# Patient Record
Sex: Female | Born: 1955 | Race: White | Hispanic: No | Marital: Married | Smoking: Never smoker
Health system: Southern US, Community
[De-identification: ages and names within clinical notes are randomized; demographics above are authoritative.]

## PROBLEM LIST (undated history)

## (undated) DIAGNOSIS — T7840XA Allergy, unspecified, initial encounter: Secondary | ICD-10-CM

## (undated) DIAGNOSIS — B0059 Other herpesviral disease of eye: Secondary | ICD-10-CM

## (undated) DIAGNOSIS — M810 Age-related osteoporosis without current pathological fracture: Secondary | ICD-10-CM

## (undated) DIAGNOSIS — F419 Anxiety disorder, unspecified: Secondary | ICD-10-CM

## (undated) DIAGNOSIS — R519 Headache, unspecified: Secondary | ICD-10-CM

## (undated) DIAGNOSIS — C50919 Malignant neoplasm of unspecified site of unspecified female breast: Secondary | ICD-10-CM

## (undated) DIAGNOSIS — R51 Headache: Secondary | ICD-10-CM

## (undated) DIAGNOSIS — Z923 Personal history of irradiation: Secondary | ICD-10-CM

## (undated) DIAGNOSIS — J45909 Unspecified asthma, uncomplicated: Secondary | ICD-10-CM

## (undated) DIAGNOSIS — Z79890 Hormone replacement therapy (postmenopausal): Secondary | ICD-10-CM

## (undated) HISTORY — DX: Anxiety disorder, unspecified: F41.9

## (undated) HISTORY — DX: Age-related osteoporosis without current pathological fracture: M81.0

## (undated) HISTORY — DX: Headache: R51

## (undated) HISTORY — DX: Allergy, unspecified, initial encounter: T78.40XA

## (undated) HISTORY — DX: Hormone replacement therapy: Z79.890

## (undated) HISTORY — PX: BREAST SURGERY: SHX581

## (undated) HISTORY — DX: Malignant neoplasm of unspecified site of unspecified female breast: C50.919

## (undated) HISTORY — PX: WISDOM TOOTH EXTRACTION: SHX21

## (undated) HISTORY — DX: Headache, unspecified: R51.9

---
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
## 2009-06-15 LAB — HM DEXA SCAN

## 2010-03-03 ENCOUNTER — Encounter: Payer: Self-pay | Admitting: Family Medicine

## 2014-06-05 LAB — HM COLONOSCOPY

## 2015-06-06 LAB — HM MAMMOGRAPHY

## 2015-06-06 LAB — HM PAP SMEAR

## 2016-04-06 LAB — HEPATIC FUNCTION PANEL
ALT: 38 — AB (ref 7–35)
AST: 33 (ref 13–35)

## 2016-04-06 LAB — LIPID PANEL
Cholesterol: 193 (ref 0–200)
LDL Cholesterol: 143
Triglycerides: 83 (ref 40–160)

## 2016-04-06 LAB — BASIC METABOLIC PANEL
BUN: 19 (ref 4–21)
Potassium: 4.2 (ref 3.4–5.3)
Sodium: 139 (ref 137–147)

## 2016-04-06 LAB — CBC AND DIFFERENTIAL
HCT: 38 (ref 36–46)
Hemoglobin: 12.7 (ref 12.0–16.0)
Platelets: 258 (ref 150–399)
WBC: 4.4

## 2016-04-06 LAB — VITAMIN D 25 HYDROXY (VIT D DEFICIENCY, FRACTURES): Vit D, 25-Hydroxy: 46

## 2016-04-06 LAB — TSH: TSH: 1.98 (ref <=5.90)

## 2016-05-22 ENCOUNTER — Encounter: Payer: Self-pay | Admitting: Family Medicine

## 2016-05-22 LAB — BASIC METABOLIC PANEL: Glucose: 91

## 2016-05-22 LAB — HM HEPATITIS C SCREENING LAB: HM Hepatitis Screen: NEGATIVE

## 2018-02-25 DIAGNOSIS — Z23 Encounter for immunization: Secondary | ICD-10-CM | POA: Diagnosis not present

## 2018-03-14 ENCOUNTER — Encounter: Payer: Self-pay | Admitting: Family Medicine

## 2018-03-14 ENCOUNTER — Encounter: Payer: Self-pay | Admitting: Emergency Medicine

## 2018-03-14 ENCOUNTER — Ambulatory Visit (INDEPENDENT_AMBULATORY_CARE_PROVIDER_SITE_OTHER): Payer: BLUE CROSS/BLUE SHIELD | Admitting: Family Medicine

## 2018-03-14 VITALS — BP 110/62 | HR 71 | Temp 98.0°F | Ht 64.25 in | Wt 133.8 lb

## 2018-03-14 DIAGNOSIS — Z23 Encounter for immunization: Secondary | ICD-10-CM | POA: Diagnosis not present

## 2018-03-14 DIAGNOSIS — M25532 Pain in left wrist: Secondary | ICD-10-CM | POA: Diagnosis not present

## 2018-03-14 DIAGNOSIS — Z79890 Hormone replacement therapy (postmenopausal): Secondary | ICD-10-CM | POA: Insufficient documentation

## 2018-03-14 DIAGNOSIS — Z780 Asymptomatic menopausal state: Secondary | ICD-10-CM

## 2018-03-14 DIAGNOSIS — B351 Tinea unguium: Secondary | ICD-10-CM

## 2018-03-14 DIAGNOSIS — M8580 Other specified disorders of bone density and structure, unspecified site: Secondary | ICD-10-CM

## 2018-03-14 DIAGNOSIS — F4322 Adjustment disorder with anxiety: Secondary | ICD-10-CM | POA: Insufficient documentation

## 2018-03-14 DIAGNOSIS — Z8262 Family history of osteoporosis: Secondary | ICD-10-CM | POA: Insufficient documentation

## 2018-03-14 DIAGNOSIS — M810 Age-related osteoporosis without current pathological fracture: Secondary | ICD-10-CM | POA: Insufficient documentation

## 2018-03-14 HISTORY — DX: Hormone replacement therapy: Z79.890

## 2018-03-14 HISTORY — DX: Asymptomatic menopausal state: Z78.0

## 2018-03-14 HISTORY — DX: Other specified disorders of bone density and structure, unspecified site: M85.80

## 2018-03-14 MED ORDER — SERTRALINE HCL 100 MG PO TABS: 100.0000 mg | ORAL_TABLET | Freq: Every day | ORAL | 3 refills | Status: DC

## 2018-03-14 NOTE — Patient Instructions (Addendum)
Please return in 6-8 weeks for your annual complete physical; please come fasting and to recheck mood.  Please go to our [facility] office to get your xrays done. You can walk in M-F between 8am and 5pm. Tell them you are there for xrays ordered by me. They will send me the results, then I will let you know the results with instructions.   Address: [place], [place], [place]  (office sits at [address] at [address] intersection; from here, turn left onto [place] [address]), take to [address], turn right and go for a mile or so, office will be on left across form [employer] )   It was a pleasure meeting you today! Thank you for choosing [place] to meet your healthcare needs! I truly look forward to working with you. If you have any questions or concerns, please send me a message via Mychart or call the office at [phone].   Fungal Nail Infection Fungal nail infection is a common fungal infection of the toenails or fingernails. This condition affects toenails more often than fingernails. More than one nail may be infected. The condition can be passed from person to person (is contagious). What are the causes? This condition is caused by a fungus. Several types of funguses can cause the infection. These funguses are common in moist and warm areas. If your hands or feet come into contact with the fungus, it may get into a crack in your fingernail or toenail and cause the infection. What increases the risk? The following factors may make you more likely to develop this condition:  Being female.  Having diabetes.  Being of older age.  Living with someone who has the fungus.  Walking barefoot in areas where the fungus thrives, such as showers or locker rooms.  Having poor circulation.  Wearing shoes and socks that cause your feet to sweat.  Having athlete's foot.  Having a nail injury or history of a recent nail  surgery.  Having psoriasis.  Having a weak body defense system (immune system).  What are the signs or symptoms? Symptoms of this condition include:  A pale spot on the nail.  Thickening of the nail.  A nail that becomes yellow or brown.  A brittle or ragged nail edge.  A crumbling nail.  A nail that has lifted away from the nail bed.  How is this diagnosed? This condition is diagnosed with a physical exam. Your health care provider may take a scraping or clipping from your nail to test for the fungus. How is this treated? Mild infections do not need treatment. If you have significant nail changes, treatment may include:  Oral antifungal medicines. You may need to take the medicine for several weeks or several months, and you may not see the results for a long time. These medicines can cause side effects. Ask your health care provider what problems to watch for.  Antifungal nail polish and nail cream. These may be used along with oral antifungal medicines.  Laser treatment of the nail.  Surgery to remove the nail. This may be needed for the most severe infections.  Treatment takes a long time, and the infection may come back. Follow these instructions at home: Medicines  Take or apply over-the-counter and prescription medicines only as told by your health care provider.  Ask your health care provider about using over-the-counter mentholated ointment on your nails. Lifestyle   Do not share personal  items, such as towels or nail clippers.  Trim your nails often.  Wash and dry your hands and feet every day.  Wear absorbent socks, and change your socks frequently.  Wear shoes that allow air to circulate, such as sandals or canvas tennis shoes. Throw out old shoes.  Wear rubber gloves if you are working with your hands in wet areas.  Do not walk barefoot in shower rooms or locker rooms.  Do not use a nail salon that does not use clean instruments.  Do not use  artificial nails. General instructions  Keep all follow-up visits as told by your health care provider. This is important.  Use antifungal foot powder on your feet and in your shoes. Contact a health care provider if: Your infection is not getting better or it is getting worse after several months. This information is not intended to replace advice given to you by your health care provider. Make sure you discuss any questions you have with your health care provider. Document Released: 05/19/2000 Document Revised: 10/28/2015 Document Reviewed: 11/23/2014 Elsevier Interactive Patient Education  2018 [employer].

## 2018-03-14 NOTE — Progress Notes (Signed)
Subjective  CC:  Chief Complaint  Patient presents with  . Establish Care    sees GYN doctor, request TDAP today   . Wrist Pain    fell last year and still has pains in Left Wrist   . Nail Problem    she states that she has a fungus Left Great Toe    HPI: Katrina Williamson is a 62 y.o. female who presents to [place] [facility] today to establish care with me as a new patient.  Moved here from May in [place]. Works for [employer], now [employer]. Forensic psychologist. Stressful job. Recently divorced after 32 year marriage. Now single with 2 cats.  She is coping very well.  She is happy about living in [place] and looks forward to this new adventure. She has the following concerns or needs:  She admits she is feeling slightly down, appropriately so.  But oversleeping.  Is on sertraline and that has worked well for her.  Uses rare Xanax for panic-like symptoms.  PHQ 9 score is 10 today.  Would be open to adjusting medication to help her symptoms while she transitions.  Left wrist pain: She fell and outstretched arm in January of this year due to slipping on the ice.  She said she had mild pain without swelling or bruising.  It was not very limiting at all.  Never sought medical care.  However still has pain with pressing on left wrist especially while doing yoga or significant work with the left hand.  No weakness in the hand.  No paresthesias.  Does not require pain medicine.  Think she has a nail fungus left great toe.  Started after getting her nails done.  No pain, no redness.  Past medical history significant for mildly early menopause at age 9, since on HRT.  Does well.  Has strong family history of osteoporosis.  By chart review of old records she does carry the diagnosis of osteopenia with lowest T score of -2.3.  She does take calcium and vitamin D.  May be due for bone density.  Health maintenance: Up-to-date on colorectal cancer screening.  Pap smear  due next year.  Assessment  1. Adjustment disorder with anxiety   2. Postmenopausal HRT (hormone replacement therapy)   3. Family history of osteoporosis   4. Left wrist pain   5. Onychomycosis   6. Osteopenia after menopause      Plan   Discussed adjustment disorder with history of anxiety: Increase sertraline to 100 mg daily titrate up slowly over the next to 3 weeks.  Recheck 4 to 6 weeks.  Onychomycosis: Monitor for now.  Defers topicals.  Risk outweigh benefits for oral antifungals.  Osteopenia and osteoporosis in the family: Recommend weightbearing exercise, continue vitamin D and calcium and recheck bone density when due.  Left wrist pain: Damaged the triangular fibrocartilage mechanism: Check x-ray to look for arthritis.  If persists, consider hand surgery referral.  Patient agrees.  Continue HRT for now.  Over the next 2 to 3 years will consider weaning.  Discussed elevating cardiovascular risk as patient ages.  Follow up:  Return in about 8 weeks (around 05/09/2018) for complete physical, mood follow up. Orders Placed This Encounter  Procedures  . HM MAMMOGRAPHY  . DG Wrist Complete Left  . HM PAP SMEAR  . HM COLONOSCOPY   Meds ordered this encounter  Medications  . sertraline (ZOLOFT) 100 MG tablet    Sig: Take 1  tablet (100 mg total) by mouth daily.    Dispense:  90 tablet    Refill:  3     No flowsheet data found.  We updated and reviewed the patient's past history in detail and it is documented below.  Patient Active Problem List   Diagnosis Date Noted  . Postmenopausal HRT (hormone replacement therapy) 03/14/2018    Menopause at age 74;    . Family history of osteoporosis 03/14/2018    Mom with severe osteoporosis   . Adjustment disorder with anxiety 03/14/2018  . Osteopenia after menopause 03/14/2018    2011 DEXA, T equals -2.3.  See old records    Health Maintenance  Topic Date Due  . DEXA SCAN  11/20/1955  . TETANUS/TDAP  09/19/1974  .  MAMMOGRAM  06/05/2016  . INFLUENZA VACCINE  01/03/2018  . PAP SMEAR  06/05/2018  . COLONOSCOPY  06/06/2019  . Hepatitis C Screening  Completed  . HIV Screening  Completed    There is no immunization history on file for this patient. Current Meds  Medication Sig  . ALPRAZolam (XANAX) 0.25 MG tablet Take 0.25 mg by mouth at bedtime as needed for anxiety.  . calcium-vitamin D (OSCAL WITH D) 250-125 MG-UNIT tablet Take 1 tablet by mouth daily.  Marland Kitchen estradiol (VIVELLE-DOT) 0.05 MG/24HR patch   . progesterone (PROMETRIUM) 100 MG capsule   . sertraline (ZOLOFT) 100 MG tablet Take 1 tablet (100 mg total) by mouth daily.  . [DISCONTINUED] sertraline (ZOLOFT) 50 MG tablet     Allergies: Patient has no allergies on file. Past Medical History Patient  has a past medical history of Allergies, Frequent headaches, Osteopenia after menopause (03/14/2018), and Postmenopausal HRT (hormone replacement therapy) (03/14/2018). Past Surgical History Patient  has no past surgical history on file. Family History: Patient family history is not on file. Social History:  Patient  reports that she has never smoked. She has never used smokeless tobacco. She reports that she does not drink alcohol or use drugs.  Review of Systems: Constitutional: negative for fever or malaise Ophthalmic: negative for photophobia, double vision or loss of vision Cardiovascular: negative for chest pain, dyspnea on exertion, or new LE swelling Respiratory: negative for SOB or persistent cough Gastrointestinal: negative for abdominal pain, change in bowel habits or melena Genitourinary: negative for dysuria or gross hematuria Musculoskeletal: negative for new gait disturbance or muscular weakness Integumentary: negative for new or persistent rashes Neurological: negative for TIA or stroke symptoms Psychiatric: negative for SI or delusions Allergic/Immunologic: negative for hives  Patient Care Team    Relationship Specialty  Notifications Start End  Leamon Arnt, MD PCP - General Family Medicine  03/14/18     Objective  Vitals: BP 110/62   Pulse 71   Temp 98 F (36.7 C)   Ht 5' 4.25" (1.632 m)   Wt 133 lb 12.8 oz (60.7 kg)   SpO2 98%   BMI 22.79 kg/m  General:  Well developed, well nourished, no acute distress  Psych:  Alert and oriented,normal mood and affect HEENT:  Normocephalic, atraumatic, non-icteric sclera, PERRL, oropharynx is without mass or exudate, supple neck  MSK: no deformities, contusions. Joints are without erythema or swelling Left wrist: Prominent styloid process laterally nontender, no swelling warmth or redness.  Full range of motion pain with forced dorsiflexion.  Normal grip. Skin:  Warm, no rashes or suspicious lesions noted, left great toenail with medial edge and discoloration.  No surrounding redness.  No ingrown toenail. Neurologic:  Mental status is normal. Gross motor and sensory exams are normal. Normal gait   Commons side effects, risks, benefits, and alternatives for medications and treatment plan prescribed today were discussed, and the patient expressed understanding of the given instructions. Patient is instructed to call or message via MyChart if he/she has any questions or concerns regarding our treatment plan. No barriers to understanding were identified. We discussed Red Flag symptoms and signs in detail. Patient expressed understanding regarding what to do in case of urgent or emergency type symptoms.   Medication list was reconciled, printed and provided to the patient in AVS. Patient instructions and summary information was reviewed with the patient as documented in the AVS. This note was prepared with assistance of Dragon voice recognition software. Occasional wrong-word or sound-a-like substitutions may have occurred due to the inherent limitations of voice recognition software

## 2018-03-15 ENCOUNTER — Encounter: Payer: Self-pay | Admitting: Emergency Medicine

## 2018-03-19 DIAGNOSIS — L820 Inflamed seborrheic keratosis: Secondary | ICD-10-CM | POA: Diagnosis not present

## 2018-03-19 DIAGNOSIS — Z1283 Encounter for screening for malignant neoplasm of skin: Secondary | ICD-10-CM | POA: Diagnosis not present

## 2018-03-19 DIAGNOSIS — D485 Neoplasm of uncertain behavior of skin: Secondary | ICD-10-CM | POA: Diagnosis not present

## 2018-03-19 DIAGNOSIS — L918 Other hypertrophic disorders of the skin: Secondary | ICD-10-CM | POA: Diagnosis not present

## 2018-04-03 NOTE — Progress Notes (Signed)
Please call patient: I have reviewed his/her bone density results. dexa scan shows stable osteopenia or low bone mass. No changes in therapy at this time: continue vit D and Ca if recommended and weight bearing exercise. We will recheck in 2 years.

## 2018-04-05 ENCOUNTER — Ambulatory Visit (INDEPENDENT_AMBULATORY_CARE_PROVIDER_SITE_OTHER): Payer: BLUE CROSS/BLUE SHIELD

## 2018-04-05 DIAGNOSIS — M25532 Pain in left wrist: Secondary | ICD-10-CM | POA: Diagnosis not present

## 2018-04-05 DIAGNOSIS — M19032 Primary osteoarthritis, left wrist: Secondary | ICD-10-CM | POA: Diagnosis not present

## 2018-04-08 NOTE — Progress Notes (Signed)
Please call patient: I have reviewed his/her lab results. Wrist xray shows mild arthritis. This could be contributing to pain. As we discussed, if worsens or wants more help, may refer to hand surgeon for evaluation.

## 2018-04-25 ENCOUNTER — Ambulatory Visit (INDEPENDENT_AMBULATORY_CARE_PROVIDER_SITE_OTHER): Payer: BLUE CROSS/BLUE SHIELD | Admitting: Family Medicine

## 2018-04-25 ENCOUNTER — Encounter: Payer: Self-pay | Admitting: Family Medicine

## 2018-04-25 ENCOUNTER — Other Ambulatory Visit: Payer: Self-pay

## 2018-04-25 VITALS — BP 118/70 | HR 69 | Temp 98.7°F | Resp 14 | Ht 64.0 in | Wt 132.0 lb

## 2018-04-25 DIAGNOSIS — M810 Age-related osteoporosis without current pathological fracture: Secondary | ICD-10-CM

## 2018-04-25 DIAGNOSIS — E2839 Other primary ovarian failure: Secondary | ICD-10-CM

## 2018-04-25 DIAGNOSIS — F4322 Adjustment disorder with anxiety: Secondary | ICD-10-CM

## 2018-04-25 DIAGNOSIS — Z1239 Encounter for other screening for malignant neoplasm of breast: Secondary | ICD-10-CM

## 2018-04-25 DIAGNOSIS — Z8262 Family history of osteoporosis: Secondary | ICD-10-CM

## 2018-04-25 DIAGNOSIS — Z79890 Hormone replacement therapy (postmenopausal): Secondary | ICD-10-CM

## 2018-04-25 DIAGNOSIS — Z780 Asymptomatic menopausal state: Secondary | ICD-10-CM

## 2018-04-25 DIAGNOSIS — M8580 Other specified disorders of bone density and structure, unspecified site: Secondary | ICD-10-CM

## 2018-04-25 DIAGNOSIS — Z0000 Encounter for general adult medical examination without abnormal findings: Secondary | ICD-10-CM | POA: Diagnosis not present

## 2018-04-25 LAB — COMPREHENSIVE METABOLIC PANEL
ALT: 17 U/L (ref 0–35)
AST: 20 U/L (ref 0–37)
Albumin: 4.6 g/dL (ref 3.5–5.2)
Alkaline Phosphatase: 81 U/L (ref 39–117)
BUN: 14 mg/dL (ref 6–23)
CO2: 29 mEq/L (ref 19–32)
Calcium: 9.7 mg/dL (ref 8.4–10.5)
Chloride: 102 mEq/L (ref 96–112)
Creatinine, Ser: 0.98 mg/dL (ref 0.40–1.20)
GFR: 61 mL/min (ref >60.00)
Glucose, Bld: 93 mg/dL (ref 70–99)
Potassium: 4.1 mEq/L (ref 3.5–5.1)
Sodium: 139 mEq/L (ref 135–145)
Total Bilirubin: 0.6 mg/dL (ref 0.2–1.2)
Total Protein: 7.3 g/dL (ref 6.0–8.3)

## 2018-04-25 LAB — CBC WITH DIFFERENTIAL/PLATELET
Basophils Absolute: 0 10*3/uL (ref 0.0–0.1)
Basophils Relative: 0.7 % (ref 0.0–3.0)
Eosinophils Absolute: 0.2 10*3/uL (ref 0.0–0.7)
Eosinophils Relative: 3.8 % (ref 0.0–5.0)
HCT: 43.5 % (ref 36.0–46.0)
Hemoglobin: 14.7 g/dL (ref 12.0–15.0)
Lymphocytes Relative: 25.2 % (ref 12.0–46.0)
Lymphs Abs: 1.4 10*3/uL (ref 0.7–4.0)
MCHC: 33.7 g/dL (ref 30.0–36.0)
MCV: 91.6 fl (ref 78.0–100.0)
Monocytes Absolute: 0.4 10*3/uL (ref 0.1–1.0)
Monocytes Relative: 7.5 % (ref 3.0–12.0)
Neutro Abs: 3.6 10*3/uL (ref 1.4–7.7)
Neutrophils Relative %: 62.8 % (ref 43.0–77.0)
Platelets: 250 10*3/uL (ref 150.0–400.0)
RBC: 4.75 Mil/uL (ref 3.87–5.11)
RDW: 12.9 % (ref 11.5–15.5)
WBC: 5.7 10*3/uL (ref 4.0–10.5)

## 2018-04-25 LAB — LIPID PANEL
Cholesterol: 224 mg/dL — ABNORMAL HIGH (ref 0–200)
HDL: 61.4 mg/dL (ref >39.00)
LDL Cholesterol: 144 mg/dL — ABNORMAL HIGH (ref 0–99)
NonHDL: 163.03
Total CHOL/HDL Ratio: 4
Triglycerides: 94 mg/dL (ref 0.0–149.0)
VLDL: 18.8 mg/dL (ref 0.0–40.0)

## 2018-04-25 NOTE — Patient Instructions (Signed)
Please return in 3-6 months to recheck mood.  If you have any questions or concerns, please don't hesitate to send me a message via MyChart or call the office at [phone]. Thank you for visiting with [place] today! It's our pleasure caring for you.  We will call you with information regarding your referral appointment. [facility] for your mammogram and bone density scan. If you do not hear from [place] within the next 2 weeks, please let me know. It can take 1-2 weeks to get appointments set up with the specialists.   Recommendations for women to keep healthy:   EXERCISE AND DIET: We recommended that you start or continue a regular exercise program for good health. Regular exercise means any activity that makes your heart beat faster and makes you sweat. We recommend exercising at least 30 minutes per day at least 3 days a week, preferably 4 or 5. We also recommend a diet low in fat and sugar. Inactivity, poor dietary choices and obesity can cause diabetes, heart attack, stroke, and kidney damage, among others.   ALCOHOL AND SMOKING: Women should limit their alcohol intake to no more than 7 drinks/beers/glasses of wine (combined, not each!) per week. Moderation of alcohol intake to this level decreases your risk of breast cancer and liver damage. And of course, no recreational drugs are part of a healthy lifestyle. And absolutely no smoking or even second hand smoke. Most people know smoking can cause heart and lung diseases, but did you know it also contributes to weakening of your bones? Aging of your skin? Yellowing of your teeth and nails?  CALCIUM AND VITAMIN D: Adequate intake of calcium and Vitamin D are recommended. The recommendations for exact amounts of these supplements seem to change often, but generally speaking 600 mg of calcium (either carbonate or citrate) and 800 units of Vitamin D per day seems prudent. Certain women may benefit from higher intake of Vitamin D. If you are among these  women, your doctor will have told you during your visit.   PAP SMEARS: Pap smears, to check for cervical cancer or precancers, have traditionally been done yearly, although recent scientific advances have shown that most women can have pap smears less often. However, every woman still should have a physical exam from her gynecologist every year. It will include a breast check, inspection of the vulva and vagina to check for abnormal growths or skin changes, a visual exam of the cervix, and then an exam to evaluate the size and shape of the uterus and ovaries. And after 62 years of age, a rectal exam is indicated to check for rectal cancers. We will also provide age appropriate advice regarding health maintenance, like when you should have certain vaccines, screening for sexually transmitted diseases, bone density testing, colonoscopy, mammograms, etc.   MAMMOGRAMS: All women over 12 years old should have a yearly mammogram. Many facilities now offer a "3D" mammogram, which may cost around $50 extra out of pocket. If possible, we recommend you accept the option to have the 3D mammogram performed. It both reduces the number of women who will be called back for extra views which then turn out to be normal, and it is better than the routine mammogram at detecting truly abnormal areas.   COLONOSCOPY: Colonoscopy to screen for colon cancer is recommended for all women at age 31. We know, you hate the idea of the prep. We agree, BUT, having colon cancer and not knowing it is worse!! Colon cancer so  often starts as a polyp that can be seen and removed at colonscopy, which can quite literally save your life! And if your first colonoscopy is normal and you have no family history of colon cancer, most women don't have to have it again for 10 years. Once every ten years, you can do something that may end up saving your life, right? We will be happy to help you get it scheduled when you are ready. Be sure to check your  insurance coverage so you understand how much it will cost. It may be covered as a preventative service at no cost, but you should check your particular policy.

## 2018-04-25 NOTE — Addendum Note (Signed)
Addended by: Katina Dung on: 04/25/2018 10:23 AM   Modules accepted: Orders

## 2018-04-25 NOTE — Progress Notes (Signed)
Subjective  Chief Complaint  Patient presents with  . Annual Exam    HPI: Katrina Williamson is a 62 y.o. female who presents to [place] at [facility] today for a Female Wellness Visit. She also has the concerns and/or needs as listed above in the chief complaint. These will be addressed in addition to the Health Maintenance Visit.   Wellness Visit: annual visit with health maintenance review and exam without Pap   Here for annual exam.  Due for bone density screening and mammogram.  History of osteopenia due to early menopause but she continues on HRT.  Tolerating HRT well.  Colorectal cancer screening is up-to-date.  Pap smear will be due next year, never has had an abnormal Chronic disease f/u and/or acute problem visit: (deemed necessary to be done in addition to the wellness visit):  Follow-up adjustment anxiety: We increased her Zoloft at last visit and she has noticed significant improvement.  Less anxiety and worry.  No significant adverse effects from dose change.  Assessment  1. Annual physical exam   2. Breast cancer screening   3. Hypoestrogenism   4. Adjustment disorder with anxiety   5. Osteopenia after menopause   6. Postmenopausal HRT (hormone replacement therapy)   7. Family history of osteoporosis      Plan  Female Wellness Visit:  Age appropriate Health Maintenance and Prevention measures were discussed with patient. Included topics are cancer screening recommendations, ways to keep healthy (see AVS) including dietary and exercise recommendations, regular eye and dental care, use of seat belts, and avoidance of moderate alcohol use and tobacco use.  Mammogram and bone density ordered.  BMI: discussed patient's BMI and encouraged positive lifestyle modifications to help get to or maintain a target BMI.  HM needs and immunizations were addressed and ordered. See below for orders. See HM and immunization section for updates.  Routine labs and  screening tests ordered including cmp, cbc and lipids where appropriate.  Discussed recommendations regarding Vit D and calcium supplementation (see AVS)  Chronic disease management visit and/or acute problem visit:  We will follow-up on osteopenia with repeat bone density.  Hopefully will be stable given HRT. Follow up: Return in about 6 months (around 10/24/2018) for mood follow up.  Orders Placed This Encounter  Procedures  . MM DIGITAL SCREENING BILATERAL  . DG Bone Density  . HIV Antibody (routine testing w rflx)  . CBC with Differential/Platelet  . Comprehensive metabolic panel  . Lipid panel   No orders of the defined types were placed in this encounter.     Lifestyle: Body mass index is 22.66 kg/m. Wt Readings from Last 3 Encounters:  04/25/18 132 lb (59.9 kg)  03/14/18 133 lb 12.8 oz (60.7 kg)   Diet: low fat Exercise: frequently,   Patient Active Problem List   Diagnosis Date Noted  . Postmenopausal HRT (hormone replacement therapy) 03/14/2018    Menopause at age 58;    . Family history of osteoporosis 03/14/2018    Mom with severe osteoporosis   . Adjustment disorder with anxiety 03/14/2018  . Osteopenia after menopause 03/14/2018    Dexa 03/2018, stable osteopenia, t = -2.3 lowest. 2011 DEXA, T equals -2.3.  See old records    Health Maintenance  Topic Date Due  . DEXA SCAN  06/16/2011  . MAMMOGRAM  06/05/2016  . PAP SMEAR  06/05/2018  . COLONOSCOPY  03/14/2020  . TETANUS/TDAP  03/14/2028  . INFLUENZA VACCINE  Completed  . Hepatitis C  Screening  Completed  . HIV Screening  Completed   Immunization History  Administered Date(s) Administered  . Influenza-Unspecified 02/25/2018  . Tdap 03/14/2018   We updated and reviewed the patient's past history in detail and it is documented below. Allergies: Patient has no allergies on file. Past Medical History Patient  has a past medical history of Allergies, Frequent headaches, Osteopenia after menopause  (03/14/2018), and Postmenopausal HRT (hormone replacement therapy) (03/14/2018). Past Surgical History Patient  has no past surgical history on file. Family History: Patient family history is not on file. Social History:  Patient  reports that she has never smoked. She has never used smokeless tobacco. She reports that she does not drink alcohol or use drugs.  Review of Systems: Constitutional: negative for fever or malaise Ophthalmic: negative for photophobia, double vision or loss of vision Cardiovascular: negative for chest pain, dyspnea on exertion, or new LE swelling Respiratory: negative for SOB or persistent cough Gastrointestinal: negative for abdominal pain, change in bowel habits or melena Genitourinary: negative for dysuria or gross hematuria, no abnormal uterine bleeding or disharge Musculoskeletal: negative for new gait disturbance or muscular weakness Integumentary: negative for new or persistent rashes, no breast lumps Neurological: negative for TIA or stroke symptoms Psychiatric: negative for SI or delusions Allergic/Immunologic: negative for hives  Patient Care Team    Relationship Specialty Notifications Start End  Leamon Arnt, MD PCP - General Family Medicine  03/14/18     Objective  Vitals: BP 118/70   Pulse 69   Temp 98.7 F (37.1 C) (Oral)   Resp 14   Ht 5\' 4"  (1.626 m)   Wt 132 lb (59.9 kg)   SpO2 98%   BMI 22.66 kg/m  General:  Well developed, well nourished, no acute distress  Psych:  Alert and orientedx3,normal mood and affect HEENT:  Normocephalic, atraumatic, non-icteric sclera, PERRL, oropharynx is clear without mass or exudate, supple neck without adenopathy, mass or thyromegaly Cardiovascular:  Normal S1, S2, RRR without gallop, rub or murmur, nondisplaced PMI Respiratory:  Good breath sounds bilaterally, CTAB with normal respiratory effort Gastrointestinal: normal bowel sounds, soft, non-tender, no noted masses. No HSM MSK: no  deformities, contusions. Joints are without erythema or swelling. Spine and CVA region are nontender Skin:  Warm, no rashes or suspicious lesions noted Neurologic:    Mental status is normal. CN 2-11 are normal. Gross motor and sensory exams are normal. Normal gait. No tremor Breast Exam: No mass, skin retraction or nipple discharge is appreciated in either breast. No axillary adenopathy. Fibrocystic changes are not noted    Commons side effects, risks, benefits, and alternatives for medications and treatment plan prescribed today were discussed, and the patient expressed understanding of the given instructions. Patient is instructed to call or message via MyChart if he/she has any questions or concerns regarding our treatment plan. No barriers to understanding were identified. We discussed Red Flag symptoms and signs in detail. Patient expressed understanding regarding what to do in case of urgent or emergency type symptoms.   Medication list was reconciled, printed and provided to the patient in AVS. Patient instructions and summary information was reviewed with the patient as documented in the AVS. This note was prepared with assistance of Dragon voice recognition software. Occasional wrong-word or sound-a-like substitutions may have occurred due to the inherent limitations of voice recognition software

## 2018-04-26 ENCOUNTER — Encounter: Payer: Self-pay | Admitting: Family Medicine

## 2018-04-26 ENCOUNTER — Encounter: Payer: BLUE CROSS/BLUE SHIELD | Admitting: Family Medicine

## 2018-04-26 LAB — HIV ANTIBODY (ROUTINE TESTING W REFLEX): HIV 1&2 Ab, 4th Generation: NONREACTIVE

## 2018-04-26 NOTE — Progress Notes (Signed)
Lab results mailed to patient in letter. Normal results. No action / follow up needed on these results.

## 2018-04-26 NOTE — Progress Notes (Signed)
I have reviewed results. Normal. Patient notified by letter. Please see letter for details. 

## 2018-05-09 ENCOUNTER — Other Ambulatory Visit: Payer: Self-pay | Admitting: Family Medicine

## 2018-05-09 MED ORDER — ESTRADIOL 0.05 MG/24HR TD PTTW: 1.0000 | MEDICATED_PATCH | TRANSDERMAL | 2 refills | Status: DC

## 2018-05-09 NOTE — Telephone Encounter (Signed)
Requested medication (s) are due for refill today: not specified  Requested medication (s) are on the active medication list: yes    Last refill: 03/14/18  Future visit scheduled yes  08/06/2018 Dr. Jonni Sanger  Notes to clinic:historical provider  Requested Prescriptions  Pending Prescriptions Disp Refills   estradiol (VIVELLE-DOT) 0.05 MG/24HR patch 8 patch      OB/GYN:  Estrogens Failed - 05/09/2018 10:51 AM      Failed - Mammogram is up-to-date per Health Maintenance      Passed - Last BP in normal range    BP Readings from Last 1 Encounters:  04/25/18 118/70         Passed - Valid encounter within last 12 months    Recent Outpatient Visits          2 weeks ago Annual physical exam   [facility], [place]   1 month ago Adjustment disorder with anxiety   [facility] [place], [place]      Future Appointments            In 2 months Leamon Arnt, MD [facility] [place], [facility]

## 2018-05-09 NOTE — Telephone Encounter (Signed)
Copied from [place] [id number]. Topic: Quick Communication - Rx Refill/Question >> May 09, 2018 10:18 AM Percell Belt A wrote: Medication: estradiol (VIVELLE-DOT) 0.05 MG/24HR patch [[id number]  Has the patient contacted their pharmacy?no, She is new pt and Dr Jonni Sanger has never filled this before but aware she was going to need a refill.  It was not due on previous visits  (Agent: If no, request that the patient contact the pharmacy for the refill.) (Agent: If yes, when and what did the pharmacy advise?)  Preferred Pharmacy (with phone number or street name): [id number] - [place], [place]. AT [address] [phone] (Phone)   Agent: Please be advised that RX refills may take up to 3 business days. We ask that you follow-up with your pharmacy.

## 2018-05-31 ENCOUNTER — Other Ambulatory Visit: Payer: Self-pay | Admitting: Family Medicine

## 2018-06-13 DIAGNOSIS — D485 Neoplasm of uncertain behavior of skin: Secondary | ICD-10-CM | POA: Diagnosis not present

## 2018-06-13 DIAGNOSIS — L820 Inflamed seborrheic keratosis: Secondary | ICD-10-CM | POA: Diagnosis not present

## 2018-07-10 ENCOUNTER — Ambulatory Visit
Admission: RE | Admit: 2018-07-10 | Discharge: 2018-07-10 | Disposition: A | Discharge status: Home / Self Care | Payer: BLUE CROSS/BLUE SHIELD | Source: Ambulatory Visit | Attending: Family Medicine | Admitting: Family Medicine

## 2018-07-10 DIAGNOSIS — Z0000 Encounter for general adult medical examination without abnormal findings: Secondary | ICD-10-CM

## 2018-07-10 DIAGNOSIS — Z1239 Encounter for other screening for malignant neoplasm of breast: Secondary | ICD-10-CM

## 2018-07-10 DIAGNOSIS — Z1231 Encounter for screening mammogram for malignant neoplasm of breast: Secondary | ICD-10-CM | POA: Diagnosis not present

## 2018-07-10 DIAGNOSIS — Z780 Asymptomatic menopausal state: Secondary | ICD-10-CM | POA: Diagnosis not present

## 2018-07-10 DIAGNOSIS — M810 Age-related osteoporosis without current pathological fracture: Secondary | ICD-10-CM | POA: Diagnosis not present

## 2018-07-10 DIAGNOSIS — E2839 Other primary ovarian failure: Secondary | ICD-10-CM

## 2018-07-11 ENCOUNTER — Encounter: Payer: Self-pay | Admitting: Family Medicine

## 2018-07-11 NOTE — Progress Notes (Signed)
Please call patient: I have reviewed his/her bone density results. Her bone density results now show she has osteoporosis which puts her at increased risk for bone fractures. (right femur) I recommend consideration of starting a bone density medication called Fosamax. She may schedule an office visit to discuss further. Continue cal and Vit D and HRT.

## 2018-07-17 DIAGNOSIS — J343 Hypertrophy of nasal turbinates: Secondary | ICD-10-CM | POA: Diagnosis not present

## 2018-07-17 DIAGNOSIS — J310 Chronic rhinitis: Secondary | ICD-10-CM | POA: Diagnosis not present

## 2018-07-17 DIAGNOSIS — J342 Deviated nasal septum: Secondary | ICD-10-CM | POA: Diagnosis not present

## 2018-07-17 DIAGNOSIS — H608X3 Other otitis externa, bilateral: Secondary | ICD-10-CM | POA: Diagnosis not present

## 2018-07-17 DIAGNOSIS — H6121 Impacted cerumen, right ear: Secondary | ICD-10-CM | POA: Diagnosis not present

## 2018-08-06 ENCOUNTER — Other Ambulatory Visit: Payer: Self-pay

## 2018-08-06 ENCOUNTER — Encounter: Payer: Self-pay | Admitting: Family Medicine

## 2018-08-06 ENCOUNTER — Ambulatory Visit: Payer: BLUE CROSS/BLUE SHIELD | Admitting: Family Medicine

## 2018-08-06 VITALS — BP 102/66 | HR 62 | Temp 98.2°F | Resp 16 | Ht 64.0 in | Wt 137.0 lb

## 2018-08-06 DIAGNOSIS — Z79890 Hormone replacement therapy (postmenopausal): Secondary | ICD-10-CM | POA: Diagnosis not present

## 2018-08-06 DIAGNOSIS — M810 Age-related osteoporosis without current pathological fracture: Secondary | ICD-10-CM | POA: Diagnosis not present

## 2018-08-06 DIAGNOSIS — F4322 Adjustment disorder with anxiety: Secondary | ICD-10-CM | POA: Diagnosis not present

## 2018-08-06 DIAGNOSIS — Z8262 Family history of osteoporosis: Secondary | ICD-10-CM | POA: Diagnosis not present

## 2018-08-06 MED ORDER — CALCIUM CITRATE-VITAMIN D 200-250 MG-UNIT PO TABS: ORAL_TABLET | ORAL | Status: DC

## 2018-08-06 MED ORDER — PROGESTERONE MICRONIZED 100 MG PO CAPS: 100.0000 mg | ORAL_CAPSULE | Freq: Every day | ORAL | 3 refills | Status: DC

## 2018-08-06 NOTE — Patient Instructions (Signed)
Please return in November 2020 for your annual complete physical; please come fasting.   If you have any questions or concerns, please don't hesitate to send me a message via MyChart or call the office at [phone]. Thank you for visiting with [place] today! It's our pleasure caring for you.   Osteoporosis  Osteoporosis is thinning and loss of density in your bones. Osteoporosis makes bones more brittle and fragile and more likely to break (fracture). Over time, osteoporosis can cause your bones to become so weak that they fracture after a minor fall. Bones in the hip, wrist, and spine are most likely to fracture due to osteoporosis. What are the causes? The exact cause of this condition is not known. What increases the risk? You may be at greater risk for osteoporosis if you:  Have a family history of the condition.  Have poor nutrition.  Use steroid medicines, such as prednisone.  Are female.  Are age 70 or older.  Smoke or have a history of smoking.  Are not physically active (are sedentary).  Are white (Caucasian) or of Asian descent.  Have a small body frame.  Take certain medicines, such as antiseizure medicines. What are the signs or symptoms? A fracture might be the first sign of osteoporosis, especially if the fracture results from a fall or injury that usually would not cause a bone to break. Other signs and symptoms include:  Pain in the neck or low back.  Stooped posture.  Loss of height. How is this diagnosed? This condition may be diagnosed based on:  Your medical history.  A physical exam.  A bone mineral density test, also called a DXA or DEXA test (dual-energy X-ray absorptiometry test). This test uses X-rays to measure the amount of minerals in your bones. How is this treated? The goal of treatment is to strengthen your bones and lower your risk for a fracture. Treatment may involve:  Making lifestyle changes, such as: ? Including foods with more  calcium and vitamin D in your diet. ? Doing weight-bearing and muscle-strengthening exercises. ? Stopping tobacco use. ? Limiting alcohol intake.  Taking medicine to slow the process of bone loss or to increase bone density.  Taking daily supplements of calcium and vitamin D.  Taking hormone replacement medicines, such as estrogen for women and testosterone for men.  Monitoring your levels of calcium and vitamin D. Follow these instructions at home:  Activity  Exercise as told by your health care provider. Ask your health care provider what exercises and activities are safe for you. You should do: ? Exercises that make you work against gravity (weight-bearing exercises), such as tai chi, yoga, or walking. ? Exercises to strengthen muscles, such as lifting weights. Lifestyle  Limit alcohol intake to no more than 1 drink a day for nonpregnant women and 2 drinks a day for men. One drink equals 12 oz of beer, 5 oz of wine, or 1 oz of hard liquor.  Do not use any products that contain nicotine or tobacco, such as cigarettes and e-cigarettes. If you need help quitting, ask your health care provider. Preventing falls  Use devices to help you move around (mobility aids) as needed, such as canes, walkers, scooters, or crutches.  Keep rooms well-lit and clutter-free.  Remove tripping hazards from walkways, including cords and throw rugs.  Install grab bars in bathrooms and safety rails on stairs.  Use rubber mats in the bathroom and other areas that are often wet or slippery.  Wear  closed-toe shoes that fit well and support your feet. Wear shoes that have rubber soles or low heels.  Review your medicines with your health care provider. Some medicines can cause dizziness or changes in blood pressure, which can increase your risk of falling. General instructions  Include calcium and vitamin D in your diet. Calcium is important for bone health, and vitamin D helps your body to absorb  calcium. Good sources of calcium and vitamin D include: ? Certain fatty fish, such as salmon and tuna. ? Products that have calcium and vitamin D added to them (fortified products), such as fortified cereals. ? Egg yolks. ? Cheese. ? Liver.  Take over-the-counter and prescription medicines only as told by your health care provider.  Keep all follow-up visits as told by your health care provider. This is important. Contact a health care provider if:  You have never been screened for osteoporosis and you are: ? A woman who is age 66 or older. ? A man who is age 63 or older. Get help right away if:  You fall or injure yourself. Summary  Osteoporosis is thinning and loss of density in your bones. This makes bones more brittle and fragile and more likely to break (fracture),even with minor falls.  The goal of treatment is to strengthen your bones and reduce your risk for a fracture.  Include calcium and vitamin D in your diet. Calcium is important for bone health, and vitamin D helps your body to absorb calcium.  Talk with your health care provider about screening for osteoporosis if you are a woman who is age 23 or older, or a man who is age 32 or older. This information is not intended to replace advice given to you by your health care provider. Make sure you discuss any questions you have with your health care provider. Document Released: 03/01/2005 Document Revised: 03/16/2017 Document Reviewed: 03/16/2017 Elsevier Interactive Patient Education  2019 [employer].

## 2018-08-06 NOTE — Progress Notes (Signed)
Subjective  CC:  Chief Complaint  Patient presents with  . Anxiety    She is take 50mg  and reports that she is doing well on dosage.Marland Kitchen GAD 7 Score - 3    HPI: Katrina Williamson is a 64 y.o. female who presents to the office today to address the problems listed above in the chief complaint.  Follow-up anxiety and adjustment disorder: Back in November patient was on sertraline 100 mg daily and had improvement from her active anxiety symptoms.  However over the next 3 to 4 weeks on this high dose, she had significant side effects including fatigue.  Since, she decreased herself back to 50 mg daily and fortunately is doing extremely well.  Feels her anxiety is well managed.  No depressive and/or anxiety symptoms.  No panic.  No other adverse effects.  Osteoporosis: Reviewed recent bone density results that showed worsening bone density and now with a diagnosis of osteoporosis.  Patient takes calcium and vitamin D but has not been doing much weightbearing exercises due to a stressful year.  She has a strong family history of osteoporosis in both her sister who experienced a fracture in her mother who had multiple long-term complications from osteoporosis.  She remains on HRT due to premature menopause. GAD 7 : Generalized Anxiety Score 08/06/2018  Nervous, Anxious, on Edge 1  Control/stop worrying 1  Worry too much - different things 1  Trouble relaxing 0  Restless 0  Easily annoyed or irritable 0  Afraid - awful might happen 0  Total GAD 7 Score 3  Anxiety Difficulty Not difficult at all    Assessment  1. Adjustment disorder with anxiety   2. Osteoporosis without current pathological fracture, unspecified osteoporosis type   3. Family history of osteoporosis   4. Postmenopausal HRT (hormone replacement therapy)      Plan   Adjustment disorder with anxiety: Well-controlled.  Continue current management.  Recheck 6 months  Today's visit was 30 minutes long. Greater than 50% of this time  was devoted to face to face counseling with the patient and coordination of care. We discussed her diagnosis, prognosis, treatment options and treatment plan is documented below.   Osteoporosis: Had long discussion regarding etiology, prognosis, risks, treatment options with associated risks and benefits.  Patient currently elects to maximize calcium and vitamin D supplementation, continue HRT and work hard on weightbearing exercises.  We will recheck a bone density in 1 year and if worsening institute Fosamax therapy at that time.  Continue HRT  Follow up: Return in about 9 months (around 05/08/2019) for complete physical.  Visit date not found  No orders of the defined types were placed in this encounter.  Meds ordered this encounter  Medications  . Calcium Citrate-Vitamin D (CITRACAL PETITES/VITAMIN D) 200-250 MG-UNIT TABS    Sig: Take 3 tabs twice a day    Dispense:  60 tablet  . progesterone (PROMETRIUM) 100 MG capsule    Sig: Take 1 capsule (100 mg total) by mouth daily.    Dispense:  90 capsule    Refill:  3      I reviewed the patients updated PMH, FH, and SocHx.    Patient Active Problem List   Diagnosis Date Noted  . Postmenopausal HRT (hormone replacement therapy) 03/14/2018  . Family history of osteoporosis 03/14/2018  . Adjustment disorder with anxiety 03/14/2018  . Osteoporosis 03/14/2018   Current Meds  Medication Sig  . ALPRAZolam (XANAX) 0.25 MG tablet Take 0.25 mg by  mouth at bedtime as needed for anxiety.  Marland Kitchen estradiol (VIVELLE-DOT) 0.05 MG/24HR patch PLACE 1 PATCH (0.05 MG TOTAL) ONTO THE SKIN 2 (TWO) TIMES A WEEK.  . progesterone (PROMETRIUM) 100 MG capsule Take 1 capsule (100 mg total) by mouth daily.  . sertraline (ZOLOFT) 100 MG tablet Take 1 tablet (100 mg total) by mouth daily.  . [DISCONTINUED] calcium-vitamin D (OSCAL WITH D) 250-125 MG-UNIT tablet Take 1 tablet by mouth daily.  . [DISCONTINUED] progesterone (PROMETRIUM) 100 MG capsule      Allergies: Patient has No Known Allergies. Family History: Patient family history is not on file. Social History:  Patient  reports that she has never smoked. She has never used smokeless tobacco. She reports that she does not drink alcohol or use drugs.  Review of Systems: Constitutional: Negative for fever malaise or anorexia Cardiovascular: negative for chest pain Respiratory: negative for SOB or persistent cough Gastrointestinal: negative for abdominal pain  Objective  Vitals: BP 102/66   Pulse 62   Temp 98.2 F (36.8 C) (Oral)   Resp 16   Ht 5\' 4"  (1.626 m)   Wt 137 lb (62.1 kg)   SpO2 98%   BMI 23.52 kg/m  General: no acute distress , A&Ox3 Psych: Looks happy, normal affect  Bone density results: T equals -2.6, February 2020     Commons side effects, risks, benefits, and alternatives for medications and treatment plan prescribed today were discussed, and the patient expressed understanding of the given instructions. Patient is instructed to call or message via MyChart if he/she has any questions or concerns regarding our treatment plan. No barriers to understanding were identified. We discussed Red Flag symptoms and signs in detail. Patient expressed understanding regarding what to do in case of urgent or emergency type symptoms.   Medication list was reconciled, printed and provided to the patient in AVS. Patient instructions and summary information was reviewed with the patient as documented in the AVS. This note was prepared with assistance of Dragon voice recognition software. Occasional wrong-word or sound-a-like substitutions may have occurred due to the inherent limitations of voice recognition software

## 2018-11-12 ENCOUNTER — Other Ambulatory Visit: Payer: Self-pay | Admitting: Family Medicine

## 2018-11-12 DIAGNOSIS — H608X3 Other otitis externa, bilateral: Secondary | ICD-10-CM | POA: Diagnosis not present

## 2018-11-12 DIAGNOSIS — J310 Chronic rhinitis: Secondary | ICD-10-CM | POA: Diagnosis not present

## 2018-11-12 DIAGNOSIS — J343 Hypertrophy of nasal turbinates: Secondary | ICD-10-CM | POA: Diagnosis not present

## 2018-11-12 DIAGNOSIS — J342 Deviated nasal septum: Secondary | ICD-10-CM | POA: Diagnosis not present

## 2019-02-17 ENCOUNTER — Encounter: Payer: Self-pay | Admitting: Family Medicine

## 2019-02-17 ENCOUNTER — Ambulatory Visit: Payer: BLUE CROSS/BLUE SHIELD | Admitting: Family Medicine

## 2019-02-17 ENCOUNTER — Other Ambulatory Visit: Payer: Self-pay

## 2019-02-17 VITALS — BP 106/64 | HR 78 | Temp 98.2°F | Resp 14 | Ht 64.0 in | Wt 135.8 lb

## 2019-02-17 DIAGNOSIS — B0059 Other herpesviral disease of eye: Secondary | ICD-10-CM | POA: Diagnosis not present

## 2019-02-17 DIAGNOSIS — F4322 Adjustment disorder with anxiety: Secondary | ICD-10-CM

## 2019-02-17 HISTORY — DX: Other herpesviral disease of eye: B00.59

## 2019-02-17 MED ORDER — VALACYCLOVIR HCL 1 G PO TABS: 1000.0000 mg | ORAL_TABLET | Freq: Two times a day (BID) | ORAL | 2 refills | Status: DC

## 2019-02-17 MED ORDER — SERTRALINE HCL 50 MG PO TABS: 75.0000 mg | ORAL_TABLET | Freq: Every day | ORAL | 3 refills | Status: DC

## 2019-02-17 NOTE — Progress Notes (Signed)
Subjective  CC:  Chief Complaint  Patient presents with  . Blister    She reports recurrent blister/cluster of blisters.. Left side, uses a homemade ointment.. Reports that there is some buring of the eye & dry eyes.. Started 9/11    HPI: Katrina Williamson is a 63 y.o. female who presents to the office today to address the problems listed above in the chief complaint.  Pleasant 63 year old female reports a blistering red mildly painful recurrent rash that often comes when she is stressed out, always around the left eye or eyelid.  Her current outbreak happened 3 days ago.  No visual changes but mild eye dryness.  She had another recurrent outbreak about a month ago.  Typically she gets them annually and this started about 20 years ago, always in the same location.  She did note that when she started treatment for her anxiety with Zoloft, her symptoms recurrence says happened less frequently.  She has no associated systemic symptoms.  No visual disturbance, eye pain, rashes never involving the tip of her nose.  She is never had shingles to her knowledge.  Adjustment disorder: High stress due to her boss.  Taking Zoloft 50 mg daily.  Sleeps well.  No panic attacks no adverse effects from medications although she was not able to tolerate the 100 mg dose we tried that last year.  She will get her flu shot from her work in September. Assessment  1. Herpes simplex dermatitis of eyelid   2. 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  Valtrex twice a day for a week and then as needed for recurrences  Adjustment disorder currently active due to stress at work.  Increase Zoloft to 75 mg daily.  Recheck in 8 weeks for physical.  Follow up: Return in about 8 weeks (around 04/14/2019) for complete physical.  Visit date not found  No orders of the defined types were placed in this encounter.  Meds ordered this  encounter  Medications  . valACYclovir (VALTREX) 1000 MG tablet    Sig: Take 1 tablet (1,000 mg total) by mouth 2 (two) times daily.    Dispense:  14 tablet    Refill:  2  . sertraline (ZOLOFT) 50 MG tablet    Sig: Take 1.5 tablets (75 mg total) by mouth daily.    Dispense:  135 tablet    Refill:  3      I reviewed the patients updated PMH, FH, and SocHx.    Patient Active Problem List   Diagnosis Date Noted  . 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 Postmenopausal HRT (hormone replacement therapy) 03/14/2018  . Family history of osteoporosis 03/14/2018  . Adjustment disorder with anxiety 03/14/2018  . Osteoporosis 03/14/2018   Current Meds  Medication Sig  . ALPRAZolam (XANAX) 0.25 MG tablet Take 0.25 mg by mouth at bedtime as needed for anxiety.  . Calcium Citrate-Vitamin D (CITRACAL PETITES/VITAMIN D) 200-250 MG-UNIT TABS Take 3 tabs twice a day  . estradiol (VIVELLE-DOT) 0.05 MG/24HR patch APPLY 1 PATCH ON THE SKIN TWICE A WEEK  . progesterone (PROMETRIUM) 100 MG capsule Take 1 capsule (100 mg total) by mouth daily.  . [DISCONTINUED] sertraline (ZOLOFT) 100 MG tablet Take 1 tablet (100 mg total) by mouth daily.    Allergies: Patient has No Known Allergies. Family History: Patient family history is not on file. Social History:  Patient  reports that she has never smoked. She has never used smokeless tobacco. She reports that she does not drink alcohol or use drugs.  Review of Systems: Constitutional: Negative for fever malaise or anorexia Cardiovascular: negative for chest pain Respiratory: negative for SOB or persistent cough Gastrointestinal: negative for abdominal pain  Objective  Vitals: BP 106/64   Pulse 78   Temp 98.2 F (36.8 C) (Tympanic)   Resp 14   Ht 5\' 4"  (1.626 m)   Wt 135 lb 12.8 oz (61.6 kg)   SpO2 98%   BMI 23.31 kg/m  General: no acute distress , A&Ox3 HEENT: PEERL, conjunctiva normal, normal cornea no photophobia oropharynx  moist,neck is supple Cardiovascular:  RRR without murmur or gallop.  Respiratory:  Good breath sounds bilaterally, CTAB with normal respiratory effort Skin:  Warm, periorbital blistering rash on erythematous base with some crusting.     Commons side effects, risks, benefits, and alternatives for medications and treatment plan prescribed today were discussed, and the patient expressed understanding of the given instructions. Patient is instructed to call or message via MyChart if he/she has any questions or concerns regarding our treatment plan. No barriers to understanding were identified. We discussed Red Flag symptoms and signs in detail. Patient expressed understanding regarding what to do in case of urgent or emergency type symptoms.   Medication list was reconciled, printed and provided to the patient in AVS. Patient instructions and summary information was reviewed with the patient as documented in the AVS. This note was prepared with assistance of Dragon voice recognition software. Occasional wrong-word or sound-a-like substitutions may have occurred due to the inherent limitations of voice recognition software

## 2019-02-17 NOTE — Patient Instructions (Signed)
Please return in November for your complete physical.   Can set up a nurse visit to get your shingrix vaccination at your convenience.   Take the valtrex now; may repeat for future outbreaks.  Increase your zoloft to 75 mg daily. I've sent in a new prescription.  If you have any questions or concerns, please don't hesitate to send me a message via MyChart or call the office at [phone]. Thank you for visiting with [place] today! It's our pleasure caring for you.

## 2019-02-25 ENCOUNTER — Ambulatory Visit: Payer: BC Managed Care – PPO

## 2019-02-26 ENCOUNTER — Other Ambulatory Visit: Payer: Self-pay

## 2019-02-26 ENCOUNTER — Ambulatory Visit (INDEPENDENT_AMBULATORY_CARE_PROVIDER_SITE_OTHER): Payer: BC Managed Care – PPO

## 2019-02-26 DIAGNOSIS — Z23 Encounter for immunization: Secondary | ICD-10-CM

## 2019-02-26 NOTE — Progress Notes (Signed)
Shingrix given IM left deltoid, pt tolerated well 

## 2019-04-15 ENCOUNTER — Ambulatory Visit: Payer: BC Managed Care – PPO | Admitting: Family Medicine

## 2019-04-17 ENCOUNTER — Ambulatory Visit: Payer: BC Managed Care – PPO | Admitting: Family Medicine

## 2019-05-03 ENCOUNTER — Other Ambulatory Visit: Payer: Self-pay | Admitting: Family Medicine

## 2019-05-20 ENCOUNTER — Other Ambulatory Visit: Payer: Self-pay

## 2019-05-21 ENCOUNTER — Ambulatory Visit (INDEPENDENT_AMBULATORY_CARE_PROVIDER_SITE_OTHER): Payer: BC Managed Care – PPO | Admitting: Family Medicine

## 2019-05-21 ENCOUNTER — Other Ambulatory Visit (HOSPITAL_COMMUNITY)
Admission: RE | Admit: 2019-05-21 | Discharge: 2019-05-21 | Disposition: A | Discharge status: Home / Self Care | Payer: BC Managed Care – PPO | Source: Ambulatory Visit | Attending: Family Medicine | Admitting: Family Medicine

## 2019-05-21 ENCOUNTER — Encounter: Payer: Self-pay | Admitting: Family Medicine

## 2019-05-21 VITALS — BP 138/84 | HR 59 | Temp 97.2°F | Ht 64.0 in | Wt 139.6 lb

## 2019-05-21 DIAGNOSIS — M810 Age-related osteoporosis without current pathological fracture: Secondary | ICD-10-CM | POA: Diagnosis not present

## 2019-05-21 DIAGNOSIS — Z0000 Encounter for general adult medical examination without abnormal findings: Secondary | ICD-10-CM | POA: Insufficient documentation

## 2019-05-21 DIAGNOSIS — Z79890 Hormone replacement therapy (postmenopausal): Secondary | ICD-10-CM | POA: Diagnosis not present

## 2019-05-21 DIAGNOSIS — Z23 Encounter for immunization: Secondary | ICD-10-CM | POA: Diagnosis not present

## 2019-05-21 DIAGNOSIS — Z124 Encounter for screening for malignant neoplasm of cervix: Secondary | ICD-10-CM | POA: Insufficient documentation

## 2019-05-21 DIAGNOSIS — F4322 Adjustment disorder with anxiety: Secondary | ICD-10-CM | POA: Diagnosis not present

## 2019-05-21 LAB — COMPREHENSIVE METABOLIC PANEL
ALT: 13 U/L (ref 0–35)
AST: 18 U/L (ref 0–37)
Albumin: 4.2 g/dL (ref 3.5–5.2)
Alkaline Phosphatase: 68 U/L (ref 39–117)
BUN: 21 mg/dL (ref 6–23)
CO2: 29 mEq/L (ref 19–32)
Calcium: 8.9 mg/dL (ref 8.4–10.5)
Chloride: 103 mEq/L (ref 96–112)
Creatinine, Ser: 0.91 mg/dL (ref 0.40–1.20)
GFR: 62.3 mL/min (ref >60.00)
Glucose, Bld: 87 mg/dL (ref 70–99)
Potassium: 4.4 mEq/L (ref 3.5–5.1)
Sodium: 137 mEq/L (ref 135–145)
Total Bilirubin: 0.5 mg/dL (ref 0.2–1.2)
Total Protein: 6.3 g/dL (ref 6.0–8.3)

## 2019-05-21 LAB — LIPID PANEL
Cholesterol: 214 mg/dL — ABNORMAL HIGH (ref 0–200)
HDL: 54.7 mg/dL (ref >39.00)
LDL Cholesterol: 140 mg/dL — ABNORMAL HIGH (ref 0–99)
NonHDL: 159.66
Total CHOL/HDL Ratio: 4
Triglycerides: 99 mg/dL (ref 0.0–149.0)
VLDL: 19.8 mg/dL (ref 0.0–40.0)

## 2019-05-21 LAB — CBC WITH DIFFERENTIAL/PLATELET
Basophils Absolute: 0.1 10*3/uL (ref 0.0–0.1)
Basophils Relative: 0.8 % (ref 0.0–3.0)
Eosinophils Absolute: 0.2 10*3/uL (ref 0.0–0.7)
Eosinophils Relative: 3.6 % (ref 0.0–5.0)
HCT: 40.3 % (ref 36.0–46.0)
Hemoglobin: 13.4 g/dL (ref 12.0–15.0)
Lymphocytes Relative: 23.5 % (ref 12.0–46.0)
Lymphs Abs: 1.5 10*3/uL (ref 0.7–4.0)
MCHC: 33.3 g/dL (ref 30.0–36.0)
MCV: 94 fl (ref 78.0–100.0)
Monocytes Absolute: 0.6 10*3/uL (ref 0.1–1.0)
Monocytes Relative: 8.5 % (ref 3.0–12.0)
Neutro Abs: 4.1 10*3/uL (ref 1.4–7.7)
Neutrophils Relative %: 63.6 % (ref 43.0–77.0)
Platelets: 232 10*3/uL (ref 150.0–400.0)
RBC: 4.29 Mil/uL (ref 3.87–5.11)
RDW: 13.3 % (ref 11.5–15.5)
WBC: 6.5 10*3/uL (ref 4.0–10.5)

## 2019-05-21 LAB — TSH: TSH: 1.84 u[IU]/mL (ref 0.35–4.50)

## 2019-05-21 LAB — VITAMIN D 25 HYDROXY (VIT D DEFICIENCY, FRACTURES): VITD: 41.31 ng/mL (ref 30.00–100.00)

## 2019-05-21 MED ORDER — ESTRADIOL 0.05 MG/24HR TD PTTW: 1.0000 | MEDICATED_PATCH | TRANSDERMAL | 3 refills | Status: DC

## 2019-05-21 MED ORDER — SERTRALINE HCL 50 MG PO TABS: 50.0000 mg | ORAL_TABLET | Freq: Every day | ORAL | 3 refills | Status: DC

## 2019-05-21 NOTE — Progress Notes (Signed)
Subjective  Chief Complaint  Patient presents with  . Annual Exam  . Osteoporosis  . Anxiety    HPI: Katrina Williamson is a 63 y.o. female who presents to [facility] [place] today for a Female Wellness Visit. She also has the concerns and/or needs as listed above in the chief complaint. These will be addressed in addition to the Health Maintenance Visit.   Wellness Visit: annual visit with health maintenance review and exam with Pap   HM: due for pap. mammo is nl and utd. Due colonoscopy and dexa next year. Happy and healthy. Working from home. No new concerns Chronic disease f/u and/or acute problem visit: (deemed necessary to be done in addition to the wellness visit):  Anxiety is well controlled on zoloft 50 daily. Very rare xanax use.   HRT x almost 20 years; started at age 71; no sxs. Would like to continue; no cv risk factors.   Osteoporosis: declines RX tx at this time. Takes ca vit d and exercise. Mother had symptomatic osteoporosis.   Assessment  1. Annual physical exam   2. Postmenopausal HRT (hormone replacement therapy)   3. Osteoporosis without current pathological fracture, unspecified osteoporosis type   4. Adjustment disorder with anxiety   5. Need for shingles vaccine   6. Cervical cancer screening      Plan  Female Wellness Visit:  Age appropriate Health Maintenance and Prevention measures were discussed with patient. Included topics are cancer screening recommendations, ways to keep healthy (see AVS) including dietary and exercise recommendations, regular eye and dental care, use of seat belts, and avoidance of moderate alcohol use and tobacco use. Pap with HR hpv today.   BMI: discussed patient's BMI and encouraged positive lifestyle modifications to help get to or maintain a target BMI.  HM needs and immunizations were addressed and ordered. See below for orders. See HM and immunization section for updates. shingrix #2 today  Routine  labs and screening tests ordered including cmp, cbc and lipids where appropriate.  Discussed recommendations regarding Vit D and calcium supplementation (see AVS)  Chronic disease management visit and/or acute problem visit:  Anxiety: This medical condition is well controlled. There are no signs of complications, medication side effects, or red flags. Patient is instructed to continue the current treatment plan without change in therapies or medications.   Osteoporosis: recheck dexa next year and start meds at that time if remains osteoporotic. Discussed risk vs benefits of waiting to start anti-resorpitve meds. Discussed risks vs benefits of HRT. Consider wean in the future.   Follow up: Return in about 1 year (around 05/20/2020) for complete physical.  Orders Placed This Encounter  Procedures  . Varicella-zoster vaccine IM (Shingrix)  . CBC w/Diff  . CMP  . Lipids  . TSH  . Vit D 25OH   Meds ordered this encounter  Medications  . estradiol (VIVELLE-DOT) 0.05 MG/24HR patch    Sig: Place 1 patch (0.05 mg total) onto the skin 2 (two) times a week.    Dispense:  24 patch    Refill:  3  . sertraline (ZOLOFT) 50 MG tablet    Sig: Take 1 tablet (50 mg total) by mouth daily.    Dispense:  90 tablet    Refill:  3      Lifestyle: Body mass index is 23.96 kg/m. Wt Readings from Last 3 Encounters:  05/21/19 139 lb 9.6 oz (63.3 kg)  02/17/19 135 lb 12.8 oz (61.6 kg)  08/06/18  137 lb (62.1 kg)     Patient Active Problem List   Diagnosis Date Noted  . 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 Postmenopausal HRT (hormone replacement therapy) 03/14/2018    Menopause at age 66;    . Family history of osteoporosis 03/14/2018    Mom with severe osteoporosis   . Adjustment disorder with anxiety 03/14/2018  . Osteoporosis 03/14/2018    DEXA: 07/30/2018: lowest T = -2.6, now with osteopenia. Will offer treatment. 08/2018 declined meds; will increase exercise, collagen, vit D  and calcium. Will recheck in 2 years.   Dexa 03/2018, stable osteopenia, t = -2.3 lowest. 2011 DEXA, T equals -2.3.  See old records    Health Maintenance  Topic Date Due  . PAP SMEAR-Modifier  06/05/2018  . MAMMOGRAM  07/11/2019  . COLONOSCOPY  03/14/2020  . DEXA SCAN  07/10/2020  . TETANUS/TDAP  03/14/2028  . INFLUENZA VACCINE  Completed  . Hepatitis C Screening  Completed  . HIV Screening  Completed   Immunization History  Administered Date(s) Administered  . Influenza-Unspecified 02/25/2018  . Tdap 03/14/2018  . Zoster Recombinat (Shingrix) 02/26/2019   We updated and reviewed the patient's past history in detail and it is documented below. Allergies: Patient has No Known Allergies. 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 Past Surgical History Patient  has no past surgical history on file. Family History: Patient family history is not on file. Social History:  Patient  reports that she has never smoked. She has never used smokeless tobacco. She reports that she does not drink alcohol or use drugs.  Review of Systems: Constitutional: negative for fever or malaise Ophthalmic: negative for photophobia, double vision or loss of vision Cardiovascular: negative for chest pain, dyspnea on exertion, or new LE swelling Respiratory: negative for SOB or persistent cough Gastrointestinal: negative for abdominal pain, change in bowel habits or melena Genitourinary: negative for dysuria or gross hematuria, no abnormal uterine bleeding or disharge Musculoskeletal: negative for new gait disturbance or muscular weakness Integumentary: negative for new or persistent rashes, no breast lumps Neurological: negative for TIA or stroke symptoms Psychiatric: negative for SI or  delusions Allergic/Immunologic: negative for hives  Patient Care Team    Relationship Specialty Notifications Start End  Leamon Arnt, MD PCP - General Family Medicine  03/14/18     Objective  Vitals: BP 138/84 (BP Location: Left Arm, Patient Position: Sitting, Cuff Size: Normal)   Pulse (!) 59   Temp (!) 97.2 F (36.2 C) (Temporal)   Ht 5\' 4"  (1.626 m)   Wt 139 lb 9.6 oz (63.3 kg)   SpO2 98%   BMI 23.96 kg/m  General:  Well developed, well nourished, no acute distress  Psych:  Alert and orientedx3,normal mood and affect HEENT:  Normocephalic, atraumatic, non-icteric sclera, PERRL, oropharynx is clear without mass or exudate, supple neck without adenopathy, mass or thyromegaly Cardiovascular:  Normal S1, S2, RRR without gallop, rub or murmur, nondisplaced PMI Respiratory:  Good breath sounds bilaterally, CTAB with normal respiratory effort Gastrointestinal: normal bowel sounds, soft, non-tender, no noted masses. No HSM MSK: no deformities, contusions. Joints are without erythema or swelling. Spine and CVA region are nontender Skin:  Warm, no rashes or suspicious lesions noted Neurologic:    Mental status is normal. CN 2-11 are normal. Gross motor and sensory exams are normal.  Normal gait. No tremor Breast Exam: No mass, skin retraction or nipple discharge is appreciated in either breast. No axillary adenopathy. Fibrocystic changes are not noted Pelvic Exam: Normal external genitalia, no vulvar or vaginal lesions present. Clear cervix w/o CMT. Bimanual exam reveals a nontender fundus w/o masses, nl size. No adnexal masses present. No inguinal adenopathy. A PAP smear was performed.    Commons side effects, risks, benefits, and alternatives for medications and treatment plan prescribed today were discussed, and the patient expressed understanding of the given instructions. Patient is instructed to call or message via MyChart if he/she has any questions or concerns regarding our  treatment plan. No barriers to understanding were identified. We discussed Red Flag symptoms and signs in detail. Patient expressed understanding regarding what to do in case of urgent or emergency type symptoms.   Medication list was reconciled, printed and provided to the patient in AVS. Patient instructions and summary information was reviewed with the patient as documented in the AVS. This note was prepared with assistance of Dragon voice recognition software. Occasional wrong-word or sound-a-like substitutions may have occurred due to the inherent limitations of voice recognition software  This visit occurred during the SARS-CoV-2 public health emergency.  Safety protocols were in place, including screening questions prior to the visit, additional usage of staff PPE, and extensive cleaning of exam room while observing appropriate contact time as indicated for disinfecting solutions.

## 2019-05-21 NOTE — Patient Instructions (Addendum)
Please return in 12 months for your annual complete physical; please come fasting. You may schedule a follow up appointment for mood in 6 months if you need help with that.   I will release your lab results to you on your MyChart account with further instructions. Please reply with any questions.   Today you were given your 2nd of 2 Shingrix vaccination.   If you have any questions or concerns, please don't hesitate to send me a message via MyChart or call the office at [phone]. Thank you for visiting with [place] today! It's our pleasure caring for you.   Preventive Care 63-22 Years Old, Female Preventive care refers to visits with your health care provider and lifestyle choices that can promote health and wellness. This includes:  A yearly physical exam. This may also be called an annual well check.  Regular dental visits and eye exams.  Immunizations.  Screening for certain conditions.  Healthy lifestyle choices, such as eating a healthy diet, getting regular exercise, not using drugs or products that contain nicotine and tobacco, and limiting alcohol use. What can I expect for my preventive care visit? Physical exam Your health care provider will check your:  Height and weight. This may be used to calculate body mass index (BMI), which tells if you are at a healthy weight.  Heart rate and blood pressure.  Skin for abnormal spots. Counseling Your health care provider may ask you questions about your:  Alcohol, tobacco, and drug use.  Emotional well-being.  Home and relationship well-being.  Sexual activity.  Eating habits.  Work and work Statistician.  Method of birth control.  Menstrual cycle.  Pregnancy history. What immunizations do I need?  Influenza (flu) vaccine  This is recommended every year. Tetanus, diphtheria, and pertussis (Tdap) vaccine  You may need a Td booster every 10 years. Varicella (chickenpox) vaccine  You may need this if you have  not been vaccinated. Zoster (shingles) vaccine  You may need this after age 28. Measles, mumps, and rubella (MMR) vaccine  You may need at least one dose of MMR if you were born in 1957 or later. You may also need a second dose. Pneumococcal conjugate (PCV13) vaccine  You may need this if you have certain conditions and were not previously vaccinated. Pneumococcal polysaccharide (PPSV23) vaccine  You may need one or two doses if you smoke cigarettes or if you have certain conditions. Meningococcal conjugate (MenACWY) vaccine  You may need this if you have certain conditions. Hepatitis A vaccine  You may need this if you have certain conditions or if you travel or work in places where you may be exposed to hepatitis A. Hepatitis B vaccine  You may need this if you have certain conditions or if you travel or work in places where you may be exposed to hepatitis B. Haemophilus influenzae type b (Hib) vaccine  You may need this if you have certain conditions. Human papillomavirus (HPV) vaccine  If recommended by your health care provider, you may need three doses over 6 months. You may receive vaccines as individual doses or as more than one vaccine together in one shot (combination vaccines). Talk with your health care provider about the risks and benefits of combination vaccines. What tests do I need? Blood tests  Lipid and cholesterol levels. These may be checked every 5 years, or more frequently if you are over 75 years old.  Hepatitis C test.  Hepatitis B test. Screening  Lung cancer screening. You may  have this screening every year starting at age 57 if you have a 30-pack-year history of smoking and currently smoke or have quit within the past 15 years.  Colorectal cancer screening. All adults should have this screening starting at age 2 and continuing until age 56. Your health care provider may recommend screening at age 65 if you are at increased risk. You will have tests  every 1-10 years, depending on your results and the type of screening test.  Diabetes screening. This is done by checking your blood sugar (glucose) after you have not eaten for a while (fasting). You may have this done every 1-3 years.  Mammogram. This may be done every 1-2 years. Talk with your health care provider about when you should start having regular mammograms. This may depend on whether you have a family history of breast cancer.  BRCA-related cancer screening. This may be done if you have a family history of breast, ovarian, tubal, or peritoneal cancers.  Pelvic exam and Pap test. This may be done every 3 years starting at age 16. Starting at age 70, this may be done every 5 years if you have a Pap test in combination with an HPV test. Other tests  Sexually transmitted disease (STD) testing.  Bone density scan. This is done to screen for osteoporosis. You may have this scan if you are at high risk for osteoporosis. Follow these instructions at home: Eating and drinking  Eat a diet that includes fresh fruits and vegetables, whole grains, lean protein, and low-fat dairy.  Take vitamin and mineral supplements as recommended by your health care provider.  Do not drink alcohol if: ? Your health care provider tells you not to drink. ? You are pregnant, may be pregnant, or are planning to become pregnant.  If you drink alcohol: ? Limit how much you have to 0-1 drink a day. ? Be aware of how much alcohol is in your drink. In the [place] one drink equals one 12 oz bottle of beer (355 mL), one 5 oz glass of wine (148 mL), or one 1 oz glass of hard liquor (44 mL). Lifestyle  Take daily care of your teeth and gums.  Stay active. Exercise for at least 30 minutes on 5 or more days each week.  Do not use any products that contain nicotine or tobacco, such as cigarettes, e-cigarettes, and chewing tobacco. If you need help quitting, ask your health care provider.  If you are sexually  active, practice safe sex. Use a condom or other form of birth control (contraception) in order to prevent pregnancy and STIs (sexually transmitted infections).  If told by your health care provider, take low-dose aspirin daily starting at age 54. What's next?  Visit your health care provider once a year for a well check visit.  Ask your health care provider how often you should have your eyes and teeth checked.  Stay up to date on all vaccines. This information is not intended to replace advice given to you by your health care provider. Make sure you discuss any questions you have with your health care provider. Document Released: 06/18/2015 Document Revised: 01/31/2018 Document Reviewed: 01/31/2018 Elsevier Patient Education  2020 [employer].   Menopause and Hormone Replacement Therapy Menopause is a normal time of life when menstrual periods stop completely and the ovaries stop producing the female hormones estrogen and progesterone. This lack of hormones can affect your health and cause undesirable symptoms. Hormone replacement therapy (HRT) can relieve some of  those symptoms. What is hormone replacement therapy? HRT is the use of artificial (synthetic) hormones to replace hormones that your body has stopped producing because you have reached menopause. What are my options for HRT?  HRT may consist of the synthetic hormones estrogen and progestin, or it may consist of only estrogen (estrogen-only therapy). You and your health care provider will decide which form of HRT is best for you. If you choose to be on HRT and you have a uterus, estrogen and progestin are usually prescribed. Estrogen-only therapy is used for women who do not have a uterus. Possible options for taking HRT include: Pills. Patches. Gels. Sprays. Vaginal cream. Vaginal rings. Vaginal inserts. The amount of hormone(s) that you take and how long you take the hormone(s) varies according to your health. It is  important to: Begin HRT with the lowest possible dosage. Stop HRT as soon as your health care provider tells you to stop. Work with your health care provider so that you feel informed and comfortable with your decisions. What are the benefits of HRT? HRT can reduce the frequency and severity of menopausal symptoms. Benefits of HRT vary according to the kind of symptoms that you have, how severe they are, and your overall health. HRT may help to improve the following symptoms of menopause: Hot flashes and night sweats. These are sudden feelings of heat that spread over the face and body. The skin may turn red, like a blush. Night sweats are hot flashes that happen while you are sleeping or trying to sleep. Bone loss (osteoporosis). The body loses calcium more quickly after menopause, causing the bones to become weaker. This can increase the risk for bone breaks (fractures). Vaginal dryness. The lining of the vagina can become thin and dry, which can cause pain during sex or cause infection, burning, or itching. Urinary tract infections. Urinary incontinence. This is the inability to control when you pass urine. Irritability. Short-term memory problems. What are the risks of HRT? Risks of HRT vary depending on your individual health and medical history. Risks of HRT also depend on whether you receive both estrogen and progestin or you receive estrogen only. HRT may increase the risk of: Spotting. This is when a small amount of blood leaks from the vagina unexpectedly. Endometrial cancer. This cancer is in the lining of the uterus (endometrium). Breast cancer. Increased density of breast tissue. This can make it harder to find breast cancer on a breast X-ray (mammogram). Stroke. Heart disease. Blood clots. Gallbladder disease. Liver disease. Risks of HRT can increase if you have any of the following conditions: Endometrial cancer. Liver disease. Heart disease. Breast cancer. History of  blood clots. History of stroke. Follow these instructions at home: Take over-the-counter and prescription medicines only as told by your health care provider. Get mammograms, pelvic exams, and medical checkups as often as told by your health care provider. Have Pap tests done as often as told by your health care provider. A Pap test is sometimes called a Pap smear. It is a screening test that is used to check for signs of cancer of the cervix and vagina. A Pap test can also identify the presence of infection or precancerous changes. Pap tests may be done: Every 3 years, starting at age 73. Every 5 years, starting after age 72, in combination with testing for human papillomavirus (HPV). More often or less often depending on other medical conditions you have, your age, and other risk factors. It is up to you to  get the results of your Pap test. Ask your health care provider, or the department that is doing the test, when your results will be ready. Keep all follow-up visits as told by your health care provider. This is important. Contact a health care provider if you have: Pain or swelling in your legs. Shortness of breath. Chest pain. Lumps or changes in your breasts or armpits. Slurred speech. Pain, burning, or bleeding when you urinate. Unusual vaginal bleeding. Dizziness or headaches. Weakness or numbness in any part of your arms or legs. Pain in your abdomen. Summary Menopause is a normal time of life when menstrual periods stop completely and the ovaries stop producing the female hormones estrogen and progesterone. Hormone replacement therapy (HRT) can relieve some of the symptoms of menopause. HRT can reduce the frequency and severity of menopausal symptoms. Risks of HRT vary depending on your individual health and medical history. This information is not intended to replace advice given to you by your health care provider. Make sure you discuss any questions you have with your health  care provider. Document Released: 02/18/2003 Document Revised: 01/22/2018 Document Reviewed: 01/22/2018 Elsevier Patient Education  2020 [employer].

## 2019-05-22 LAB — CYTOLOGY - PAP
Comment: NEGATIVE
Diagnosis: NEGATIVE
High risk HPV: NEGATIVE

## 2019-05-27 ENCOUNTER — Ambulatory Visit: Payer: BC Managed Care – PPO

## 2019-08-02 ENCOUNTER — Other Ambulatory Visit: Payer: Self-pay | Admitting: Family Medicine

## 2019-08-04 ENCOUNTER — Other Ambulatory Visit: Payer: Self-pay

## 2019-09-23 DIAGNOSIS — L821 Other seborrheic keratosis: Secondary | ICD-10-CM | POA: Diagnosis not present

## 2019-09-23 DIAGNOSIS — L814 Other melanin hyperpigmentation: Secondary | ICD-10-CM | POA: Diagnosis not present

## 2019-09-23 DIAGNOSIS — L578 Other skin changes due to chronic exposure to nonionizing radiation: Secondary | ICD-10-CM | POA: Diagnosis not present

## 2019-09-23 DIAGNOSIS — L820 Inflamed seborrheic keratosis: Secondary | ICD-10-CM | POA: Diagnosis not present

## 2020-01-13 ENCOUNTER — Encounter: Payer: Self-pay | Admitting: Family Medicine

## 2020-01-13 ENCOUNTER — Other Ambulatory Visit: Payer: Self-pay

## 2020-01-13 ENCOUNTER — Ambulatory Visit: Payer: Self-pay

## 2020-01-13 ENCOUNTER — Ambulatory Visit: Payer: BC Managed Care – PPO | Admitting: Family Medicine

## 2020-01-13 ENCOUNTER — Ambulatory Visit (INDEPENDENT_AMBULATORY_CARE_PROVIDER_SITE_OTHER): Payer: BC Managed Care – PPO

## 2020-01-13 VITALS — BP 102/64 | HR 74 | Ht 64.0 in | Wt 143.2 lb

## 2020-01-13 DIAGNOSIS — M25562 Pain in left knee: Secondary | ICD-10-CM

## 2020-01-13 DIAGNOSIS — S8992XA Unspecified injury of left lower leg, initial encounter: Secondary | ICD-10-CM | POA: Diagnosis not present

## 2020-01-13 NOTE — Patient Instructions (Signed)
Thank you for coming in today.  Plan to give this some time.  Use voltaren gel over the counter.  Advance activity as tolerated.  Try compression sleeve.   I recommend you obtained a compression sleeve to help with your joint problems. There are many options on the market however I recommend obtaining a full knee Body Helix compression sleeve.  You can find information (including how to appropriate measure yourself for sizing) can be found at www.Body http://www.lambert.com/.  Many of these products are health savings account (HSA) eligible.   You can use the compression sleeve at any time throughout the day but is most important to use while being active as well as for 2 hours post-activity.   It is appropriate to ice following activity with the compression sleeve in place.  If not improving can do injection. Recheck for that as needed.    Meniscus Tear  A meniscus tear is a knee injury that happens when a piece of the meniscus is torn. The meniscus is a thick, rubbery, wedge-shaped cartilage in the knee. Two menisci are located in each knee. They sit between the upper bone (femur) and lower bone (tibia) that make up the knee joint. Each meniscus acts as a shock absorber for the knee. A torn meniscus is one of the most common types of knee injuries. This injury can range from mild to severe. Surgery may be needed to repair a severe tear. What are the causes? This condition may be caused by any kneeling, squatting, twisting, or pivoting movement. Sports-related injuries are the most common cause. These often occur from:  Running and stopping suddenly. ? Changing direction. ? Being tackled or knocked off your feet.  Lifting or carrying heavy weights. As people get older, their menisci get thinner and weaker. In these people, tears can happen more easily, such as from climbing stairs. What increases the risk? You are more likely to develop this condition if you:  Play contact sports.  Have a job that  requires kneeling or squatting.  Are female.  Are over 11 years old. What are the signs or symptoms? Symptoms of this condition include:  Knee pain, especially at the side of the knee joint. You may feel pain when the injury occurs, or you may only hear a pop and feel pain later.  A feeling that your knee is clicking, catching, locking, or giving way.  Not being able to fully bend or extend your knee.  Bruising or swelling in your knee. How is this diagnosed? This condition may be diagnosed based on your symptoms and a physical exam. You may also have tests, such as:  X-rays.  MRI.  A procedure to look inside your knee with a narrow surgical telescope (arthroscopy). You may be referred to a knee specialist (orthopedic surgeon). How is this treated? Treatment for this injury depends on the severity of the tear. Treatment for a mild tear may include:  Rest.  Medicine to reduce pain and swelling. This is usually a nonsteroidal anti-inflammatory drug (NSAID), like ibuprofen.  A knee brace, sleeve, or wrap.  Using crutches or a walker to keep weight off your knee and to help you walk.  Exercises to strengthen your knee (physical therapy). You may need surgery if you have a severe tear or if other treatments are not working. Follow these instructions at home: If you have a brace, sleeve, or wrap:  Wear it as told by your health care provider. Remove it only as told by  your health care provider.  Loosen the brace, sleeve, or wrap if your toes tingle, become numb, or turn cold and blue.  Keep the brace, sleeve, or wrap clean and dry.  If the brace, sleeve, or wrap is not waterproof: ? Do not let it get wet. ? Cover it with a watertight covering when you take a bath or shower. Managing pain and swelling   Take over-the-counter and prescription medicines only as told by your health care provider.  If directed, put ice on your knee: ? If you have a removable brace, sleeve,  or wrap, remove it as told by your health care provider. ? Put ice in a plastic bag. ? Place a towel between your skin and the bag. ? Leave the ice on for 20 minutes, 2-3 times per day.  Move your toes often to avoid stiffness and to lessen swelling.  Raise (elevate) the injured area above the level of your heart while you are sitting or lying down. Activity  Do not use the injured limb to support your body weight until your health care provider says that you can. Use crutches or a walker as told by your health care provider.  Return to your normal activities as told by your health care provider. Ask your health care provider what activities are safe for you.  Perform range-of-motion exercises only as told by your health care provider.  Begin doing exercises to strengthen your knee and leg muscles only as told by your health care provider. After you recover, your health care provider may recommend these exercises to help prevent another injury. General instructions  Use a knee brace, sleeve, or wrap as told by your health care provider.  Ask your health care provider when it is safe to drive if you have a brace, sleeve, or wrap on your knee.  Do not use any products that contain nicotine or tobacco, such as cigarettes, e-cigarettes, and chewing tobacco. If you need help quitting, ask your health care provider.  Ask your health care provider if the medicine prescribed to you: ? Requires you to avoid driving or using heavy machinery. ? Can cause constipation. You may need to take these actions to prevent or treat constipation:  Drink enough fluid to keep your urine pale yellow.  Take over-the-counter or prescription medicines.  Eat foods that are high in fiber, such as beans, whole grains, and fresh fruits and vegetables.  Limit foods that are high in fat and processed sugars, such as fried or sweet foods.  Keep all follow-up visits as told by your health care provider. This is  important. Contact a health care provider if:  You have a fever.  Your knee becomes red, tender, or swollen.  Your pain medicine is not helping.  Your symptoms get worse or do not improve after 2 weeks of home care. Summary  A meniscus tear is a knee injury that happens when a piece of the meniscus is torn.  Treatment for this injury depends on the severity of the tear. You may need surgery if you have a severe tear or if other treatments are not working.  Rest, ice, and raise (elevate) your injured knee as told by your health care provider. This will help lessen pain and swelling.  Contact a health care provider if you have new symptoms, or your symptoms get worse or do not improve after 2 weeks of home care.  Keep all follow-up visits as told by your health care provider. This is  important. This information is not intended to replace advice given to you by your health care provider. Make sure you discuss any questions you have with your health care provider. Document Revised: 12/04/2017 Document Reviewed: 12/04/2017 Elsevier Patient Education  [employer].

## 2020-01-13 NOTE — Progress Notes (Signed)
    Subjective:    CC: L knee pain  I, Molly Weber, LAT, ATC, am serving as scribe for Dr. Lynne Leader.  HPI: Pt is a 64 y/o female presenting w/ c/o L knee pain after slipping while going downstairs last Thursday, Aug.5, 2021.  She states that her L knee twisted medially.  She locates her pain to her L medial knee.  Radiating pain: no L knee swelling: yes at the L medial knee L knee mechanical symptoms: no Aggravating factors: quickly bending/extending her L knee; plant/twist on the L leg Treatments tried: RICE; Advil   Pertinent review of Systems: No fevers or chills  Relevant historical information: Osteoporosis   Objective:    Vitals:   01/13/20 1508  BP: 102/64  Pulse: 74  SpO2: 96%   General: Well Developed, well nourished, and in no acute distress.   MSK: Left knee normal-appearing without significant effusion. Normal knee motion with crepitation. Stable ligamentous exam. Positive medial McMurray's test. Intact strength.  Lab and Radiology Results  X-ray images left knee obtained today personally and dependently reviewed. Mild degenerative changes medially no acute fractures. Await formal radiology review  Diagnostic Limited MSK Ultrasound of: Left knee Quad tendon normal-appearing intact. Minimal joint effusion. Patellar tendon normal-appearing Medial joint line posterior portion of medial meniscus is degenerative and partially extruded indicating probable tear.  Anterior portion meniscus normal-appearing Lateral meniscus normal. Posterior knee no Baker's cyst. Impression: Probable medial meniscus tear.    Impression and Recommendations:    Assessment and Plan: 64 y.o. female with left knee pain after twisting injury.  Likely meniscus tear without severe mechanical symptoms.  Discussed options.  Plan for conservative management with compressive knee sleeve and Voltaren gel.  Consider injection if not improved.  Ultimately if not better or  developing mechanical symptoms would proceed with MRI for potential surgical planning..   Orders Placed This Encounter  Procedures  . [place] LIMITED JOINT SPACE STRUCTURES LOW LEFT(NO LINKED CHARGES)    Order Specific Question:   Reason for Exam (SYMPTOM  OR DIAGNOSIS REQUIRED)    Answer:   L knee pain    Order Specific Question:   Preferred imaging location?    Answer:   [facility]  . DG Knee AP/LAT W/[place] Left    Standing Status:   Future    Number of Occurrences:   1    Standing Expiration Date:   01/12/2021    Order Specific Question:   Reason for Exam (SYMPTOM  OR DIAGNOSIS REQUIRED)    Answer:   eval medial knee pain after fall    Order Specific Question:   Preferred imaging location?    Answer:   [facility]    Order Specific Question:   Radiology Contrast Protocol - do NOT remove file path    Answer:   [email or web address]   No orders of the defined types were placed in this encounter.   Discussed warning signs or symptoms. Please see discharge instructions. Patient expresses understanding.   The above documentation has been reviewed and is accurate and complete Lynne Leader, M.D.

## 2020-01-14 NOTE — Progress Notes (Signed)
Knee x-ray left looks pretty normal to radiology.

## 2020-06-05 HISTORY — PX: BREAST LUMPECTOMY: SHX2

## 2020-07-10 ENCOUNTER — Other Ambulatory Visit: Payer: Self-pay | Admitting: Family Medicine

## 2020-07-12 ENCOUNTER — Telehealth: Payer: Self-pay

## 2020-07-12 NOTE — Telephone Encounter (Signed)
.   LAST APPOINTMENT DATE: 05/20/2021   NEXT APPOINTMENT DATE:@5 /07/2020  MEDICATION:estradiol (VIVELLE-DOT) 0.05 MG/24HR patch(Expired)  PHARMACY:[id number] [place], [place] - [address]   CLINICAL FILLS OUT ALL BELOW:   LAST REFILL:  QTY:  REFILL DATE:    OTHER COMMENTS:    Okay for refill?  Please advise

## 2020-07-13 NOTE — Telephone Encounter (Signed)
Sent to pharmacy to start 07/15/20.

## 2020-07-16 ENCOUNTER — Other Ambulatory Visit: Payer: Self-pay | Admitting: Family Medicine

## 2020-08-23 ENCOUNTER — Encounter: Payer: Self-pay | Admitting: Family Medicine

## 2020-08-23 DIAGNOSIS — D485 Neoplasm of uncertain behavior of skin: Secondary | ICD-10-CM | POA: Diagnosis not present

## 2020-08-23 DIAGNOSIS — D225 Melanocytic nevi of trunk: Secondary | ICD-10-CM | POA: Diagnosis not present

## 2020-08-23 DIAGNOSIS — B351 Tinea unguium: Secondary | ICD-10-CM | POA: Diagnosis not present

## 2020-08-23 DIAGNOSIS — L738 Other specified follicular disorders: Secondary | ICD-10-CM | POA: Diagnosis not present

## 2020-08-23 DIAGNOSIS — D2262 Melanocytic nevi of left upper limb, including shoulder: Secondary | ICD-10-CM | POA: Diagnosis not present

## 2020-08-23 DIAGNOSIS — L723 Sebaceous cyst: Secondary | ICD-10-CM | POA: Diagnosis not present

## 2020-09-21 ENCOUNTER — Other Ambulatory Visit: Payer: Self-pay | Admitting: Family Medicine

## 2020-10-04 ENCOUNTER — Encounter: Payer: Self-pay | Admitting: Family Medicine

## 2020-10-04 ENCOUNTER — Ambulatory Visit (INDEPENDENT_AMBULATORY_CARE_PROVIDER_SITE_OTHER): Payer: PPO | Admitting: Family Medicine

## 2020-10-04 ENCOUNTER — Other Ambulatory Visit: Payer: Self-pay

## 2020-10-04 VITALS — BP 120/70 | HR 71 | Temp 98.4°F | Resp 16 | Ht 64.0 in | Wt 135.8 lb

## 2020-10-04 DIAGNOSIS — Z8262 Family history of osteoporosis: Secondary | ICD-10-CM

## 2020-10-04 DIAGNOSIS — M810 Age-related osteoporosis without current pathological fracture: Secondary | ICD-10-CM | POA: Diagnosis not present

## 2020-10-04 DIAGNOSIS — Z79890 Hormone replacement therapy (postmenopausal): Secondary | ICD-10-CM | POA: Diagnosis not present

## 2020-10-04 DIAGNOSIS — F4322 Adjustment disorder with anxiety: Secondary | ICD-10-CM

## 2020-10-04 DIAGNOSIS — Z23 Encounter for immunization: Secondary | ICD-10-CM | POA: Diagnosis not present

## 2020-10-04 DIAGNOSIS — Z780 Asymptomatic menopausal state: Secondary | ICD-10-CM | POA: Diagnosis not present

## 2020-10-04 DIAGNOSIS — Z0000 Encounter for general adult medical examination without abnormal findings: Secondary | ICD-10-CM | POA: Diagnosis not present

## 2020-10-04 DIAGNOSIS — Z1231 Encounter for screening mammogram for malignant neoplasm of breast: Secondary | ICD-10-CM | POA: Diagnosis not present

## 2020-10-04 LAB — COMPREHENSIVE METABOLIC PANEL
ALT: 11 U/L (ref 0–35)
AST: 16 U/L (ref 0–37)
Albumin: 4.3 g/dL (ref 3.5–5.2)
Alkaline Phosphatase: 62 U/L (ref 39–117)
BUN: 21 mg/dL (ref 6–23)
CO2: 29 mEq/L (ref 19–32)
Calcium: 9.4 mg/dL (ref 8.4–10.5)
Chloride: 103 mEq/L (ref 96–112)
Creatinine, Ser: 1.04 mg/dL (ref 0.40–1.20)
GFR: 56.59 mL/min — ABNORMAL LOW (ref >60.00)
Glucose, Bld: 75 mg/dL (ref 70–99)
Potassium: 4.3 mEq/L (ref 3.5–5.1)
Sodium: 138 mEq/L (ref 135–145)
Total Bilirubin: 0.7 mg/dL (ref 0.2–1.2)
Total Protein: 6.6 g/dL (ref 6.0–8.3)

## 2020-10-04 LAB — LIPID PANEL
Cholesterol: 218 mg/dL — ABNORMAL HIGH (ref 0–200)
HDL: 55.5 mg/dL (ref >39.00)
LDL Cholesterol: 139 mg/dL — ABNORMAL HIGH (ref 0–99)
NonHDL: 162.86
Total CHOL/HDL Ratio: 4
Triglycerides: 119 mg/dL (ref 0.0–149.0)
VLDL: 23.8 mg/dL (ref 0.0–40.0)

## 2020-10-04 LAB — CBC WITH DIFFERENTIAL/PLATELET
Basophils Absolute: 0 10*3/uL (ref 0.0–0.1)
Basophils Relative: 0.6 % (ref 0.0–3.0)
Eosinophils Absolute: 0.2 10*3/uL (ref 0.0–0.7)
Eosinophils Relative: 3.7 % (ref 0.0–5.0)
HCT: 39.5 % (ref 36.0–46.0)
Hemoglobin: 13.3 g/dL (ref 12.0–15.0)
Lymphocytes Relative: 24.2 % (ref 12.0–46.0)
Lymphs Abs: 1.4 10*3/uL (ref 0.7–4.0)
MCHC: 33.8 g/dL (ref 30.0–36.0)
MCV: 91.1 fl (ref 78.0–100.0)
Monocytes Absolute: 0.5 10*3/uL (ref 0.1–1.0)
Monocytes Relative: 9.1 % (ref 3.0–12.0)
Neutro Abs: 3.6 10*3/uL (ref 1.4–7.7)
Neutrophils Relative %: 62.4 % (ref 43.0–77.0)
Platelets: 227 10*3/uL (ref 150.0–400.0)
RBC: 4.33 Mil/uL (ref 3.87–5.11)
RDW: 13.2 % (ref 11.5–15.5)
WBC: 5.8 10*3/uL (ref 4.0–10.5)

## 2020-10-04 LAB — VITAMIN D 25 HYDROXY (VIT D DEFICIENCY, FRACTURES): VITD: 45.75 ng/mL (ref 30.00–100.00)

## 2020-10-04 MED ORDER — PROGESTERONE MICRONIZED 100 MG PO CAPS: 100.0000 mg | ORAL_CAPSULE | Freq: Every day | ORAL | 3 refills | Status: DC

## 2020-10-04 NOTE — Patient Instructions (Signed)
Please return in 12 months for your annual complete physical; please come fasting.  I will release your lab results to you on your MyChart account with further instructions. Please reply with any questions.   Today you were given your Prevnar-13 vaccination. This helps protect your from pneumonia infections.  We will continue the HRT for this year.   If you have any questions or concerns, please don't hesitate to send me a message via MyChart or call the office at [phone]. Thank you for visiting with Katrina Williamson today! It's our pleasure caring for you.  I have ordered a mammogram and/or bone density for you as we discussed today: [x]   Mammogram  [x]   Bone Density  Please call the office checked below to schedule your appointment:  [x]   [facility]      [address] [place], [place]         []   [facility]  [address] [place], [name]

## 2020-10-04 NOTE — Progress Notes (Signed)
Subjective  Chief Complaint  Patient presents with  . Annual Exam    Not fasting.    HPI: Katrina Williamson is a 65 y.o. female who presents to [facility] [place] today for a Female Wellness Visit. She also has the concerns and/or needs as listed above in the chief complaint. These will be addressed in addition to the Health Maintenance Visit.   Wellness Visit: annual visit with health maintenance review and exam without Pap   Health maintenance: Overdue for mammogram.  Due for bone density scan to follow-up on osteoporosis.  Eligible for Prevnar vaccination.  Other immunizations are up-to-date.  Last colorectal screening test was for colonoscopy 5 years ago.  I reviewed this report.  Normal exam.  No family history of cancer.  No history of polyps.  Healthy diet.  Overall doing very well.  She has met a new partner and is extremely happy with him.  They will be moving in together soon.  HRT is helpful and using a vaginal lubricant successfully.  She is hesitant to make changes at this time.   Chronic disease f/u and/or acute problem visit: (deemed necessary to be done in addition to the wellness visit):  Osteoporosis: Continues on calcium and vitamin D keeps active.  We will recheck bone density and start biphosphonate's if indicated.  Patient shared and agrees.  Discussed today.  Adjustment disorder with anxiety: Well-controlled on sertraline 50 mg daily.  No adverse effects.  Mood is good and happy.  HRT: Vivelle estrogen patch and progesterone capsules daily.  Has been on since age 40.  Again discussed risks and benefits.  No menopausal symptoms at this time.   Assessment  1. Annual physical exam   2. Osteoporosis without current pathological fracture, unspecified osteoporosis type   3. Adjustment disorder with anxiety   4. Family history of osteoporosis   5. Postmenopausal HRT (hormone replacement therapy)   6. Encounter for screening mammogram for breast  cancer   7. Asymptomatic menopausal state   8. Immunization due      Plan  Female Wellness Visit:  Age appropriate Health Maintenance and Prevention measures were discussed with patient. Included topics are cancer screening recommendations, ways to keep healthy (see AVS) including dietary and exercise recommendations, regular eye and dental care, use of seat belts, and avoidance of moderate alcohol use and tobacco use.  Mammogram ordered.  Discussed colorectal cancer screening guidelines.  Average risk, check in 5 years with repeat colonoscopy  BMI: discussed patient's BMI and encouraged positive lifestyle modifications to help get to or maintain a target BMI.  HM needs and immunizations were addressed and ordered. See below for orders. See HM and immunization section for updates.  Prevnar 13 given today  Routine labs and screening tests ordered including cmp, cbc and lipids where appropriate.  Discussed recommendations regarding Vit D and calcium supplementation (see AVS)  Chronic disease management visit and/or acute problem visit:  HRT: Discussed risks and benefits.  Given her current hectic lifestyle, osteoporosis, we elect to continue for another year and then will start slowly wean to see if tolerated.  Patient seen and agrees.  Continue sertraline 50 mg daily for adjustment disorder with anxiety: Likely will be able to stop this in the future.  Osteoporosis: Continue vitamin D and calcium.  Discussed indications and expectations of biphosphonate's.  We will start if bone density shows worsening.   Follow up: 12 months for complete physical Orders Placed This Encounter  Procedures  .  DG Bone Density  . MM DIGITAL SCREENING BILATERAL  . Pneumococcal conjugate vaccine 13-valent  . CBC with Differential/Platelet  . Comprehensive metabolic panel  . Lipid panel  . VITAMIN D 25 Hydroxy (Vit-D Deficiency, Fractures)   Meds ordered this encounter  Medications  . progesterone  (PROMETRIUM) 100 MG capsule    Sig: Take 1 capsule (100 mg total) by mouth daily.    Dispense:  90 capsule    Refill:  3      Body mass index is 23.31 kg/m. Wt Readings from Last 3 Encounters:  10/04/20 135 lb 12.8 oz (61.6 kg)  01/13/20 143 lb 3.2 oz (65 kg)  05/21/19 139 lb 9.6 oz (63.3 kg)     Patient Active Problem List   Diagnosis Date Noted  . 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 Postmenopausal HRT (hormone replacement therapy) 03/14/2018    Menopause at age 67;    . Family history of osteoporosis 03/14/2018    Mom with severe osteoporosis   . Adjustment disorder with anxiety 03/14/2018  . Osteoporosis 03/14/2018    DEXA: 07/30/2018: lowest T = -2.6, now with osteopenia. Will offer treatment. 08/2018 declined meds; will increase exercise, collagen, vit D and calcium. Will recheck in 2 years.   Dexa 03/2018, stable osteopenia, t = -2.3 lowest. 2011 DEXA, T equals -2.3.  See old records    Health Maintenance  Topic Date Due  . MAMMOGRAM  07/11/2019  . DEXA SCAN  07/10/2020  . INFLUENZA VACCINE  01/03/2021  . PNA vac Low Risk Adult (2 of 2 - PPSV23) 10/04/2021  . PAP SMEAR-Modifier  05/20/2024  . COLONOSCOPY (Pts 45-17yr Insurance coverage will need to be confirmed)  03/14/2025  . TETANUS/TDAP  03/14/2028  . COVID-19 Vaccine  Completed  . Hepatitis C Screening  Completed  . HIV Screening  Completed  . HPV VACCINES  Aged Out   Immunization History  Administered Date(s) Administered  . Influenza-Unspecified 02/25/2018  . PFIZER(Purple Top)SARS-COV-2 Vaccination 08/28/2019, 09/18/2019, 05/17/2020  . Pneumococcal Conjugate-13 10/04/2020  . Tdap 03/14/2018  . Zoster Recombinat (Shingrix) 02/26/2019, 05/21/2019   We updated and reviewed the patient's past history in detail and it is documented below. Allergies: Patient has No Known Allergies. 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 Past Surgical History Patient  has no past surgical history on file. Family History: Patient family history is not on file. Social History:  Patient  reports that she has never smoked. She has never used smokeless tobacco. She reports that she does not drink alcohol and does not use drugs.  Review of Systems: Constitutional: negative for fever or malaise Ophthalmic: negative for photophobia, double vision or loss of vision Cardiovascular: negative for chest pain, dyspnea on exertion, or new LE swelling Respiratory: negative for SOB or persistent cough Gastrointestinal: negative for abdominal pain, change in bowel habits or melena Genitourinary: negative for dysuria or gross hematuria, no abnormal uterine bleeding or disharge Musculoskeletal: negative for new gait disturbance or muscular weakness Integumentary: negative for new or persistent rashes, no breast lumps Neurological: negative for TIA or stroke symptoms Psychiatric: negative for SI or delusions Allergic/Immunologic: negative for hives  Patient Care Team    Relationship Specialty Notifications Start End  ALeamon Arnt MD PCP - General Family Medicine  03/14/18     Objective  Vitals: BP 120/70  Pulse 71   Temp 98.4 F (36.9 C) (Temporal)   Resp 16   Ht _0  (1.626 m)   Wt 135 lb 12.8 oz (61.6 kg)   SpO2 97%   BMI 23.31 kg/m  General:  Well developed, well nourished, no acute distress  Psych:  Alert and orientedx3,normal mood and affect HEENT:  Normocephalic, atraumatic, non-icteric sclera,  supple neck without adenopathy, mass or thyromegaly Cardiovascular:  Normal S1, S2, RRR without gallop, rub or murmur Respiratory:  Good breath sounds bilaterally, CTAB with normal respiratory effort Gastrointestinal: normal bowel sounds, soft, non-tender, no noted masses. No HSM MSK:  no deformities, contusions. Joints are without erythema or swelling.  Multiple moles on back Skin:  Warm, no rashes or suspicious lesions noted Neurologic:    Mental status is normal. CN 2-11 are normal. Gross motor and sensory exams are normal. Normal gait. No tremor Breast Exam: No mass, skin retraction or nipple discharge is appreciated in either breast. No axillary adenopathy. Fibrocystic changes are not noted    Commons side effects, risks, benefits, and alternatives for medications and treatment plan prescribed today were discussed, and the patient expressed understanding of the given instructions. Patient is instructed to call or message via MyChart if he/she has any questions or concerns regarding our treatment plan. No barriers to understanding were identified. We discussed Red Flag symptoms and signs in detail. Patient expressed understanding regarding what to do in case of urgent or emergency type symptoms.   Medication list was reconciled, printed and provided to the patient in AVS. Patient instructions and summary information was reviewed with the patient as documented in the AVS. This note was prepared with assistance of Dragon voice recognition software. Occasional wrong-word or sound-a-like substitutions may have occurred due to the inherent limitations of voice recognition software  This visit occurred during the SARS-CoV-2 public health emergency.  Safety protocols were in place, including screening questions prior to the visit, additional usage of staff PPE, and extensive cleaning of exam room while observing appropriate contact time as indicated for disinfecting solutions.

## 2020-10-18 ENCOUNTER — Other Ambulatory Visit: Payer: Self-pay | Admitting: Family Medicine

## 2020-10-18 DIAGNOSIS — Z780 Asymptomatic menopausal state: Secondary | ICD-10-CM

## 2020-10-18 DIAGNOSIS — M810 Age-related osteoporosis without current pathological fracture: Secondary | ICD-10-CM

## 2020-10-21 ENCOUNTER — Ambulatory Visit
Admission: RE | Admit: 2020-10-21 | Discharge: 2020-10-21 | Disposition: A | Discharge status: Home / Self Care | Payer: PPO | Source: Ambulatory Visit | Attending: Family Medicine | Admitting: Family Medicine

## 2020-10-21 ENCOUNTER — Other Ambulatory Visit: Payer: Self-pay

## 2020-10-21 DIAGNOSIS — M810 Age-related osteoporosis without current pathological fracture: Secondary | ICD-10-CM

## 2020-10-21 DIAGNOSIS — Z1231 Encounter for screening mammogram for malignant neoplasm of breast: Secondary | ICD-10-CM | POA: Diagnosis not present

## 2020-10-25 ENCOUNTER — Other Ambulatory Visit: Payer: Self-pay | Admitting: Family Medicine

## 2020-10-25 DIAGNOSIS — R928 Other abnormal and inconclusive findings on diagnostic imaging of breast: Secondary | ICD-10-CM

## 2020-11-15 ENCOUNTER — Ambulatory Visit
Admission: RE | Admit: 2020-11-15 | Discharge: 2020-11-15 | Disposition: A | Discharge status: Home / Self Care | Payer: PPO | Source: Ambulatory Visit | Attending: Family Medicine | Admitting: Family Medicine

## 2020-11-15 ENCOUNTER — Other Ambulatory Visit: Payer: Self-pay | Admitting: Family Medicine

## 2020-11-15 DIAGNOSIS — R928 Other abnormal and inconclusive findings on diagnostic imaging of breast: Secondary | ICD-10-CM

## 2020-11-15 DIAGNOSIS — N6320 Unspecified lump in the left breast, unspecified quadrant: Secondary | ICD-10-CM

## 2020-11-16 ENCOUNTER — Ambulatory Visit
Admission: RE | Admit: 2020-11-16 | Discharge: 2020-11-16 | Disposition: A | Discharge status: Home / Self Care | Payer: PPO | Source: Ambulatory Visit | Attending: Family Medicine | Admitting: Family Medicine

## 2020-11-16 ENCOUNTER — Other Ambulatory Visit: Payer: Self-pay

## 2020-11-16 DIAGNOSIS — C50512 Malignant neoplasm of lower-outer quadrant of left female breast: Secondary | ICD-10-CM | POA: Diagnosis not present

## 2020-11-16 DIAGNOSIS — N6320 Unspecified lump in the left breast, unspecified quadrant: Secondary | ICD-10-CM

## 2020-11-16 DIAGNOSIS — N6324 Unspecified lump in the left breast, lower inner quadrant: Secondary | ICD-10-CM | POA: Diagnosis not present

## 2020-11-16 DIAGNOSIS — Z170 Estrogen receptor positive status [ER+]: Secondary | ICD-10-CM | POA: Diagnosis not present

## 2020-11-22 ENCOUNTER — Other Ambulatory Visit: Payer: Self-pay | Admitting: General Surgery

## 2020-11-22 DIAGNOSIS — Z79890 Hormone replacement therapy (postmenopausal): Secondary | ICD-10-CM | POA: Diagnosis not present

## 2020-11-22 DIAGNOSIS — Z170 Estrogen receptor positive status [ER+]: Secondary | ICD-10-CM | POA: Diagnosis not present

## 2020-11-22 DIAGNOSIS — C50512 Malignant neoplasm of lower-outer quadrant of left female breast: Secondary | ICD-10-CM | POA: Diagnosis not present

## 2020-11-25 ENCOUNTER — Telehealth: Payer: Self-pay | Admitting: Oncology

## 2020-11-25 NOTE — Telephone Encounter (Signed)
Received a new pt referral from Dr. Barry Dienes for dx of breast cancer. Katrina Williamson has been cld and scheduled to see Dr. Jana Hakim on 6/28 at 4pm w/labs at 3:30pm. Pt aware to arrive 15 minutes early.

## 2020-11-26 ENCOUNTER — Other Ambulatory Visit: Payer: Self-pay | Admitting: General Surgery

## 2020-11-26 DIAGNOSIS — C50512 Malignant neoplasm of lower-outer quadrant of left female breast: Secondary | ICD-10-CM

## 2020-11-29 ENCOUNTER — Other Ambulatory Visit: Payer: Self-pay

## 2020-11-29 ENCOUNTER — Encounter: Payer: Self-pay | Admitting: Family Medicine

## 2020-11-29 ENCOUNTER — Ambulatory Visit (INDEPENDENT_AMBULATORY_CARE_PROVIDER_SITE_OTHER): Payer: PPO | Admitting: Family Medicine

## 2020-11-29 ENCOUNTER — Other Ambulatory Visit: Payer: Self-pay | Admitting: *Deleted

## 2020-11-29 ENCOUNTER — Encounter: Payer: Self-pay | Admitting: Oncology

## 2020-11-29 ENCOUNTER — Other Ambulatory Visit: Payer: Self-pay | Admitting: General Surgery

## 2020-11-29 VITALS — BP 97/65 | HR 63 | Temp 98.3°F | Ht 64.0 in | Wt 134.8 lb

## 2020-11-29 DIAGNOSIS — C50512 Malignant neoplasm of lower-outer quadrant of left female breast: Secondary | ICD-10-CM | POA: Diagnosis not present

## 2020-11-29 DIAGNOSIS — J9801 Acute bronchospasm: Secondary | ICD-10-CM

## 2020-11-29 DIAGNOSIS — R0789 Other chest pain: Secondary | ICD-10-CM | POA: Diagnosis not present

## 2020-11-29 DIAGNOSIS — J301 Allergic rhinitis due to pollen: Secondary | ICD-10-CM | POA: Diagnosis not present

## 2020-11-29 DIAGNOSIS — Z170 Estrogen receptor positive status [ER+]: Secondary | ICD-10-CM | POA: Diagnosis not present

## 2020-11-29 DIAGNOSIS — Z79890 Hormone replacement therapy (postmenopausal): Secondary | ICD-10-CM

## 2020-11-29 MED ORDER — ALBUTEROL SULFATE HFA 108 (90 BASE) MCG/ACT IN AERS: 2.0000 | INHALATION_SPRAY | RESPIRATORY_TRACT | 2 refills | Status: DC | PRN

## 2020-11-29 NOTE — Progress Notes (Addendum)
Katrina Williamson  [phone] Fax:([phone]) [phone]     ID: Katrina Williamson DOB: 1956/02/28  MR#: [id number]  FSF#:[id number]  Patient Care Team: Leamon Arnt, MD as PCP - General (Family Medicine) Chibuike Fleek, Virgie Dad, MD as Consulting Physician (Oncology) Stark Klein, MD as Consulting Physician (General Surgery) Kyung Rudd, MD as Consulting Physician (Radiation Oncology) Renda Rolls, Jennefer Bravo, MD as Referring Physician (Dermatology) Chauncey Cruel, MD OTHER MD:  CHIEF COMPLAINT: Estrogen receptor positive lobular breast cancer  CURRENT TREATMENT: Awaiting definitive surgery   HISTORY OF CURRENT ILLNESS: Katrina Williamson (pronounced "DINEness," rhyming with "highness") had routine screening mammography on 10/21/2020 showing a possible abnormality in the left breast. She underwent left diagnostic mammography with tomography and left breast ultrasonography at [facility] on 11/15/2020 showing: breast density category C; 0.9 cm mass in left breast at 3:30; normal left axillary lymph node.  Accordingly on 11/16/2020 she proceeded to biopsy of the left breast area in question. The pathology from this procedure ([id number]) showed: invasive mammary carcinoma, e-cadherin negative, grade 2; mammary carcinoma in situ. Prognostic indicators significant for: estrogen receptor, 95% positive with strong staining intensity and progesterone receptor, 90% positive with moderate staining intensity. Proliferation marker Ki67 at 1%. HER2 negative by immunohistochemistry (1+).  Cancer Staging Malignant neoplasm of lower-outer quadrant of left breast of female, estrogen receptor positive ([place]) Staging form: Breast, AJCC 8th Edition - Clinical: Stage IA (cT1b, cN0, cM0, G2, ER+, PR+, HER2-) - Signed by Chauncey Cruel, MD on 11/30/2020 Histologic grading system: 3 grade system  The patient's subsequent history is as detailed below.   INTERVAL HISTORY: Katrina Williamson was evaluated in  the breast cancer clinic on 11/30/2020 accompanied by her significant other Katrina Williamson. Her case was also presented at the multidisciplinary breast cancer conference on 11/24/2020. At that time a preliminary plan was proposed: Breast MRI, breast conserving surgery, consider Oncotype, consider radiation, antiestrogens  She is scheduled for left lumpectomy on 12/29/2020 under Dr. Barry Dienes.  Of note, she is also scheduled for bone density screening on 03/28/2021.   REVIEW OF SYSTEMS: There were no specific symptoms leading to the original mammogram, which was routinely scheduled. The patient denies unusual headaches, visual changes, nausea, vomiting, stiff neck, dizziness, or gait imbalance.  She has a history of reactive airways to certain allergens and cold, but there has been no cough, phlegm production, or pleurisy, no chest pain or pressure, and no change in bowel or bladder habits. The patient denies fever, rash, bleeding, unexplained fatigue or unexplained weight loss. A detailed review of systems was otherwise entirely negative.   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  See old records   Postmenopausal HRT (hormone replacement therapy) 03/14/2018   Menopause at age 65;     PAST SURGICAL HISTORY: No past surgical history on file. Wisdom teeth removal without complications  FAMILY HISTORY: Family History  Problem Relation Age of Onset   Arthritis Mother    Hearing loss Mother    Stroke Father    Alcohol abuse Sister    Arthritis Sister    Cancer Sister  The patient's father died at the age of 63 from old age.  He was of "[place]"  descent.  The patient's mother also died at the age of 73.  She had severe osteoporosis.  The patient has 2 sisters, no brothers.  1 sister had uterine cancer.  There is no other history of cancer in the family to the patient's knowledge   GYNECOLOGIC HISTORY:  No LMP recorded. Patient is postmenopausal. GX P 0 LMP age 77 (2002) HRT yes, used for 65 years, stopped  11/30/2018 Hysterectomy? no BSO? 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  She went through a divorce in 2019.  Currently she shares a home with Katrina Williamson, who is retired from the foreign service.  He has a son, 52 year old, who runs a business in [place].  The patient and Katrina also share 3 cats.  The patient is not a church attender   ADVANCED DIRECTIVES: Not in place; at the 11/30/2020 visit the patient was given the appropriate documents to complete and notarized at her discretion.   HEALTH MAINTENANCE: Social History   Tobacco Use   Smoking status: Never   Smokeless tobacco: Never  Vaping Use   Vaping Use: Never used  Substance Use Topics   Alcohol use: Yes    Alcohol/week: 2.0 standard drinks    Types: 1 Glasses of wine, 1 Shots of liquor per week   Drug use: Not Currently    Types: Marijuana     Colonoscopy: 06/2014 (performed in [place])  PAP: 05/2019, negative  Bone density: 07/2018, -2.6, repeat scheduled 03/2021   No Known Allergies  Current Outpatient Medications  Medication Sig Dispense Refill   albuterol (VENTOLIN HFA) 108 (90 Base) MCG/ACT inhaler Inhale 2 puffs into the lungs every 4 (four) hours as needed for wheezing or shortness of breath. 1 each 2   Calcium Citrate-Vitamin D (CITRACAL PETITES/VITAMIN D) 200-250 MG-UNIT TABS Take 3 tabs twice a day 60 tablet    ciclopirox (PENLAC) 8 % solution Apply 1 application topically daily.     sertraline (ZOLOFT) 50 MG tablet TAKE 1 TABLET BY MOUTH EVERY DAY 90 tablet 3   No current  facility-administered medications for this visit.    OBJECTIVE: White woman who appears younger than stated age  36:   11/30/20 1550  BP: (!) 103/56  Pulse: 88  Resp: 18  Temp: (!) 97.5 F (36.4 C)  SpO2: 100%     Body mass index is 23.34 kg/m.   Wt Readings from Last 3 Encounters:  11/30/20 136 lb (61.7 kg)  11/29/20 134 lb 12.8 oz (61.1 kg)  10/04/20 135 lb 12.8 oz (61.6 kg)      ECOG FS:1 - Symptomatic but completely ambulatory  Ocular: Sclerae unicteric, pupils round and equal Ear-nose-throat: Wearing a mask Lymphatic: No cervical or supraclavicular adenopathy Lungs no rales or rhonchi Heart regular rate and rhythm Abd soft, nontender, positive bowel sounds MSK no focal spinal tenderness, no joint edema Neuro: non-focal, well-oriented, appropriate affect Breasts: Right breast is benign.  The left breast is status post recent biopsy.  There is no ecchymosis.  There is no palpable mass.  There are no skin or nipple changes of concern.  Both axillae are benign   LAB RESULTS:  CMP     Component Value Date/Time   NA 141 11/30/2020 1530   NA 139 04/06/2016 0000   K 4.3 11/30/2020 1530   CL  107 11/30/2020 1530   CO2 26 11/30/2020 1530   GLUCOSE 75 11/30/2020 1530   BUN 20 11/30/2020 1530   BUN 19 04/06/2016 0000   CREATININE 1.17 (H) 11/30/2020 1530   CALCIUM 9.0 11/30/2020 1530   PROT 6.7 11/30/2020 1530   ALBUMIN 3.8 11/30/2020 1530   AST 18 11/30/2020 1530   ALT 18 11/30/2020 1530   ALKPHOS 72 11/30/2020 1530   BILITOT 0.6 11/30/2020 1530   GFRNONAA 52 (L) 11/30/2020 1530    No results found for: TOTALPROTELP, ALBUMINELP, A1GS, A2GS, BETS, BETA2SER, GAMS, MSPIKE, SPEI  Lab Results  Component Value Date   WBC 5.9 11/30/2020   NEUTROABS 3.8 11/30/2020   HGB 12.8 11/30/2020   HCT 38.3 11/30/2020   MCV 92.1 11/30/2020   PLT 223 11/30/2020    No results found for: LABCA2  No components found for: [id number]  No results for input(s): INR in the  last 168 hours.  No results found for: LABCA2  No results found for: HCW237  No results found for: SEG315  No results found for: VVO160  No results found for: CA2729  No components found for: HGQUANT  No results found for: CEA1 / No results found for: CEA1   No results found for: AFPTUMOR  No results found for: CHROMOGRNA  No results found for: KPAFRELGTCHN, LAMBDASER, KAPLAMBRATIO (kappa/lambda light chains)  No results found for: HGBA, HGBA2QUANT, HGBFQUANT, HGBSQUAN (Hemoglobinopathy evaluation)   No results found for: LDH  No results found for: IRON, TIBC, IRONPCTSAT (Iron and TIBC)  No results found for: FERRITIN  Urinalysis No results found for: COLORURINE, APPEARANCEUR, LABSPEC, PHURINE, GLUCOSEU, HGBUR, BILIRUBINUR, KETONESUR, PROTEINUR, UROBILINOGEN, NITRITE, LEUKOCYTESUR   STUDIES: US BREAST LTD UNI LEFT INC AXILLA  Result Date: 11/15/2020 CLINICAL DATA:  65 year old female presenting as a recall from screening for possible left breast asymmetry. EXAM: DIGITAL DIAGNOSTIC UNILATERAL LEFT MAMMOGRAM WITH TOMOSYNTHESIS AND CAD; ULTRASOUND LEFT BREAST LIMITED TECHNIQUE: Left digital diagnostic mammography and breast tomosynthesis was performed. The images were evaluated with computer-aided detection.; Targeted ultrasound examination of the left breast was performed COMPARISON:  Previous exam(s). ACR Breast Density Category c: The breast tissue is heterogeneously dense, which may obscure small masses. FINDINGS: Mammogram: Full field tomosynthesis views as well as spot compression tomosynthesis views of the left breast were performed. There is persistence of a mass with distortion in the outer left breast measuring approximately 0.7 cm. On physical exam of the outer left breast I feel a focal area of thickening. Ultrasound: Targeted ultrasound performed in the left breast at 3:30 o'clock 3 cm from the nipple demonstrating an irregular hypoechoic mass measuring 0.8 x 0.9 x  0.7 cm. This corresponds to the mammographic findings. There is a nearby benign intramammary lymph node measuring 0.3 x 0.2 x 0.3 cm. Targeted ultrasound the left axilla demonstrates normal lymph nodes. IMPRESSION: Suspicious mass in the left breast at 3:30 o'clock measuring 0.9 cm. RECOMMENDATION: Ultrasound-guided core needle biopsy of the left breast mass at 3:30 o'clock. I have discussed the findings and recommendations with the patient who agrees to proceed with biopsy. The patient will be scheduled for the biopsy appointment prior to leaving the office today. BI-RADS CATEGORY  4: Suspicious. Electronically Signed   By: Audie Pinto M.D.   On: 11/15/2020 13:52  MM DIAG BREAST TOMO UNI LEFT  Result Date: 11/15/2020 CLINICAL DATA:  65 year old female presenting as a recall from screening for possible left breast asymmetry. EXAM: DIGITAL DIAGNOSTIC UNILATERAL LEFT MAMMOGRAM WITH TOMOSYNTHESIS AND CAD;  ULTRASOUND LEFT BREAST LIMITED TECHNIQUE: Left digital diagnostic mammography and breast tomosynthesis was performed. The images were evaluated with computer-aided detection.; Targeted ultrasound examination of the left breast was performed COMPARISON:  Previous exam(s). ACR Breast Density Category c: The breast tissue is heterogeneously dense, which may obscure small masses. FINDINGS: Mammogram: Full field tomosynthesis views as well as spot compression tomosynthesis views of the left breast were performed. There is persistence of a mass with distortion in the outer left breast measuring approximately 0.7 cm. On physical exam of the outer left breast I feel a focal area of thickening. Ultrasound: Targeted ultrasound performed in the left breast at 3:30 o'clock 3 cm from the nipple demonstrating an irregular hypoechoic mass measuring 0.8 x 0.9 x 0.7 cm. This corresponds to the mammographic findings. There is a nearby benign intramammary lymph node measuring 0.3 x 0.2 x 0.3 cm. Targeted ultrasound the left  axilla demonstrates normal lymph nodes. IMPRESSION: Suspicious mass in the left breast at 3:30 o'clock measuring 0.9 cm. RECOMMENDATION: Ultrasound-guided core needle biopsy of the left breast mass at 3:30 o'clock. I have discussed the findings and recommendations with the patient who agrees to proceed with biopsy. The patient will be scheduled for the biopsy appointment prior to leaving the office today. BI-RADS CATEGORY  4: Suspicious. Electronically Signed   By: Audie Pinto M.D.   On: 11/15/2020 13:52  MM CLIP PLACEMENT LEFT  Result Date: 11/16/2020 CLINICAL DATA:  Post biopsy mammogram of the left breast for clip placement. EXAM: DIAGNOSTIC LEFT MAMMOGRAM POST ULTRASOUND BIOPSY COMPARISON:  Previous exam(s). FINDINGS: Mammographic images were obtained following ultrasound guided biopsy of a mass in the left breast at 3:30. The biopsy marking clip is in expected position at the site of biopsy. IMPRESSION: Appropriate positioning of the heart shaped biopsy marking clip at the site of biopsy in the slightly lower outer left breast. Final Assessment: Post Procedure Mammograms for Marker Placement Electronically Signed   By: Ammie Ferrier M.D.   On: 11/16/2020 09:22  [place] LT BREAST BX W LOC DEV 1ST LESION IMG BX SPEC US GUIDE  Addendum Date: 11/18/2020   ADDENDUM REPORT: 11/18/2020 08:14 ADDENDUM: Pathology revealed GRADE II INVASIVE MAMMARY CARCINOMA, MAMMARY CARCINOMA IN SITU of the Left breast, 3:30 o'clock, 3 cmfn, (heart shaped clip). This was found to be concordant by Dr. Ammie Ferrier. Pathology results were discussed with the patient by telephone. The patient reported doing well after the biopsy with tenderness at the site. Post biopsy instructions and care were reviewed and questions were answered. The patient was encouraged to call [facility] for any additional concerns. My direct phone number was provided. Surgical consultation has been arranged with Dr.  Stark Klein at [facility] on November 22, 2020. Pathology results reported by Terie Purser, RN on 11/18/2020. Electronically Signed   By: Ammie Ferrier M.D.   On: 11/18/2020 08:14   Result Date: 11/18/2020 CLINICAL DATA:  65 year old female presenting for ultrasound-guided biopsy of a left breast mass. EXAM: ULTRASOUND GUIDED LEFT BREAST CORE NEEDLE BIOPSY COMPARISON:  Previous exam(s). PROCEDURE: I met with the patient and we discussed the procedure of ultrasound-guided biopsy, including benefits and alternatives. We discussed the high likelihood of a successful procedure. We discussed the risks of the procedure, including infection, bleeding, tissue injury, clip migration, and inadequate sampling. Informed written consent was given. The usual time-out protocol was performed immediately prior to the procedure. Lesion quadrant: Lower outer quadrant Using sterile technique and 1% Lidocaine  as local anesthetic, under direct ultrasound visualization, a 14 gauge spring-loaded device was used to perform biopsy of a mass in the left breast at 3:30, 3 cm from the nipple using an inferior approach. At the conclusion of the procedure a heart shaped tissue marker clip was deployed into the biopsy cavity. Follow up 2 view mammogram was performed and dictated separately. IMPRESSION: Ultrasound guided biopsy of a left breast mass at 3:30. No apparent complications. Electronically Signed: By: Ammie Ferrier M.D. On: 11/16/2020 09:08    ELIGIBLE FOR AVAILABLE RESEARCH PROTOCOL: no  ASSESSMENT: 65 y.o. [place] woman status post left breast lower outer quadrant biopsy 11/16/2020 for T1b N0, stage IA invasive lobular carcinoma (E-cadherin negative), estrogen and progesterone receptor positive, HER2 not amplified, with an MIB-1 of 1%  (1) definitive surgery pending  (2) adjuvant radiation to follow  (3) antiestrogens to start at the completion of local treatment  PLAN: I met today with Katrina Williamson to  review her new diagnosis. Specifically we discussed the biology of her breast cancer, its diagnosis, staging, treatment  options and prognosis.  We started with a discussion of lobular breast cancer and she understands the cancer cells in these cases lack ecadherin, which access to the glue to bind cells together.  The result is that lobular breast cells tend to not perform masses of cells, but rather infiltrating tissues.  They are much harder to see on mammography and that is why she is having a breast MRI prior to her surgery.  We reviewed the fact that cancer is not one disease but more than 100 different diseases and that it is important to keep them separate-- otherwise when friends and relatives discuss their own cancer experiences with Katrina Williamson confusion can result. Similarly we explained that if breast cancer spreads to the bone or liver, the patient would not have bone cancer or liver cancer, but breast cancer in the bone and breast cancer in the liver: one cancer in three places-- not 3 different cancers which otherwise would have to be treated in 3 different ways.  We discussed the difference between local and systemic therapy. In terms of loco-regional treatment, lumpectomy plus radiation is equivalent to mastectomy as far as survival is concerned. For this reason, and because the cosmetic results are generally superior, we recommend breast conserving surgery.   We then discussed the rationale for systemic therapy. There is some risk that this cancer may have already spread to other parts of her body. Patients frequently ask at this point about bone scans, CAT scans and PET scans to find out if they have occult breast cancer somewhere else. The problem is that in early stage disease we are much more likely to find false positives then true cancers and this would expose the patient to unnecessary procedures as well as unnecessary radiation. Scans Katrina answer the question the patient really would  like to know, which is whether she has microscopic disease elsewhere in her body. For those reasons we do not recommend them.  Of course we would proceed to aggressive evaluation of any symptoms that might suggest metastatic disease, but that is not the case here.  Next we went over the options for systemic therapy which are anti-estrogens, anti-HER-2 immunotherapy, and chemotherapy. Katrina Williamson does not meet criteria for anti-HER-2 immunotherapy. She is a good candidate for anti-estrogens.  The question of chemotherapy is more complicated. Chemotherapy is most effective in rapidly growing, aggressive tumors. It is much less effective in intermediate-grade, slow growing cancers, like  Katrina Williamson 's. For that reason I am not planning to request an Oncotype unless the tumor proved to be larger than expected on final determination.    The plan then is for surgery, radiation, and antiestrogens and specifically I think she would be a good candidate for tamoxifen.  I gave her some preliminary information regarding that.  Katrina Williamson has a good understanding of the overall plan. She agrees with it. She knows the goal of treatment in her case is cure. She will call with any problems that may develop before her next visit here.  Total encounter time 65 minutes.Sarajane Jews C. Akaya Proffit, MD 11/30/2020 5:03 PM Medical Oncology and Hematology [facility] [place], [place] [address] Tel. [phone]    Fax. [phone]   This document serves as a record of services personally performed by Lurline Del, MD. It was created on his behalf by Wilburn Mylar, a trained medical scribe. The creation of this record is based on the scribe's personal observations and the provider's statements to them.   I, Lurline Del MD, have reviewed the above documentation for accuracy and completeness, and I agree with the above.    *Total Encounter Time as defined by the Centers for Medicare and Medicaid  Services includes, in addition to the face-to-face time of a patient visit (documented in the note above) non-face-to-face time: obtaining and reviewing outside history, ordering and reviewing medications, tests or procedures, care coordination (communications with other health care professionals or caregivers) and documentation in the medical record.

## 2020-11-29 NOTE — Progress Notes (Signed)
Subjective  CC:  Chief Complaint  Patient presents with   postmenopausal    Estradol, Prometrium want to discuss   Chest Pain    Tightness, on going for 2 weeks    HPI: Katrina Williamson is a 65 y.o. female who presents to the office today to address the problems listed above in the chief complaint. 65 year old female who has been on long-term HRT presents to discuss stopping it due to estrogen positive left-sided breast cancer recently diagnosed.  Overall, she is doing very well with her diagnosis.  Coping well.  Optimistic.  We will meet with her oncologist tomorrow, Dr. Jana Hakim.  Understands she needs to come off of the hormones at this time.  Currently without any menopausal symptoms.  She does have mild adjustment anxiety stable on Zoloft. Chest tightness constant for about 2 weeks.  Describes substernal chest tightness associated with feeling like she could start wheezing.  She thinks this is related to allergies.  She has had some mild bronchospasm years ago treated with an MDI that was related to allergies.  She has seasonal allergic rhinitis and occasionally will take Benadryl.  She takes Flonase daily in part to treat her allergies and help with snoring due to a deviated septum and nasal congestion.  She recently moved and is spending much more time outdoors.  This could be why her symptoms are present.  She denies symptoms of infection.  No fevers or chills.  No pleuritic chest pain.  She denies anginal type chest pain.  No shortness of breath, chronic cough, GERD, lower extremity edema or palpitations.  Assessment  1. Malignant neoplasm of lower-outer quadrant of left breast of female, estrogen receptor positive (Katrina Williamson)   2. Bronchospasm   3. Seasonal allergic rhinitis due to pollen   4. Postmenopausal HRT (hormone replacement therapy)   5. Chest tightness      Plan  Breast cancer left, estrogen receptor positive: Counseling and education done.  Patient understands she needs to  stop hormones.  We discussed stopping abruptly versus weaning quickly to help with menopausal symptoms.  Patient chooses to stop abruptly.  We discussed possible side effects.  She will let me know if they become burdensome to discuss other treatment options. Seasonal allergies with chest tightness, atypical.  Does not sound to be cardiac in nature.  More likely related to allergic induced bronchospasm or symptoms.  Add oral antihistamine, over-the-counter Allegra Zyrtec or Claritin.  I did print a prescription for albuterol to be used if needed.  She said this was helpful in the past.  If symptoms change she will return for further evaluation.  Follow up: Return if symptoms worsen or fail to improve.  CPE May of next year Visit date not found  No orders of the defined types were placed in this encounter.  Meds ordered this encounter  Medications   albuterol (VENTOLIN HFA) 108 (90 Base) MCG/ACT inhaler    Sig: Inhale 2 puffs into the lungs every 4 (four) hours as needed for wheezing or shortness of breath.    Dispense:  1 each    Refill:  2      I reviewed the patients updated PMH, FH, and SocHx.    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   Calcium Citrate-Vitamin D (CITRACAL PETITES/VITAMIN D) 200-250 MG-UNIT TABS Take 3 tabs twice a day   ciclopirox (PENLAC) 8 % solution Apply 1 application topically  daily.   sertraline (ZOLOFT) 50 MG tablet TAKE 1 TABLET BY MOUTH EVERY DAY   [DISCONTINUED] progesterone (PROMETRIUM) 100 MG capsule Take 1 capsule (100 mg total) by mouth daily.    Allergies: Patient has No Known Allergies. Family History: Patient family history is not on file. Social History:  Patient  reports that she has never smoked. She has never used smokeless tobacco. She reports that she does not drink alcohol and does not use drugs.  Review of Systems: Constitutional: Negative for fever malaise or anorexia Cardiovascular: negative for chest pain Respiratory: negative for SOB or persistent cough Gastrointestinal: negative for abdominal pain  Objective  Vitals: BP 97/65   Pulse 63   Temp 98.3 F (36.8 C) (Temporal)   Ht 5\' 4"  (1.626 m)   Wt 134 lb 12.8 oz (61.1 kg)   SpO2 98%   BMI 23.14 kg/m  General: no acute distress , A&Ox3 HEENT: PEERL, conjunctiva normal, neck is supple Cardiovascular:  RRR without murmur or gallop.  Respiratory:  Good breath sounds bilaterally, CTAB with normal respiratory effort, no wheezing, no chest wall tenderness Skin:  Warm, no rashes    Commons side effects, risks, benefits, and alternatives for medications and treatment plan prescribed today were discussed, and the patient expressed understanding of the given instructions. Patient is instructed to call or message via MyChart if he/she has any questions or concerns regarding our treatment plan. No barriers to understanding were identified. We discussed Red Flag symptoms and signs in detail. Patient expressed understanding regarding what to do in case of urgent or emergency type symptoms.  Medication list was reconciled, printed and provided to the patient in AVS. Patient instructions and summary information was reviewed with the patient as documented in the AVS. This note was prepared with assistance of Dragon voice recognition software. Occasional wrong-word or sound-a-like substitutions  may have occurred due to the inherent limitations of voice recognition software  This visit occurred during the SARS-CoV-2 public health emergency.  Safety protocols were in place, including screening questions prior to the visit, additional usage of staff PPE, and extensive cleaning of exam room while observing appropriate contact time as indicated for disinfecting solutions.

## 2020-11-29 NOTE — Patient Instructions (Signed)
Please follow up if symptoms do not improve or as needed.  Let me know if you need help with menopausal symptoms.  Good luck with your treatments. You will do well.

## 2020-11-30 ENCOUNTER — Other Ambulatory Visit: Payer: Self-pay

## 2020-11-30 ENCOUNTER — Inpatient Hospital Stay: Payer: PPO | Attending: Oncology | Admitting: Oncology

## 2020-11-30 ENCOUNTER — Inpatient Hospital Stay: Payer: PPO

## 2020-11-30 VITALS — BP 103/56 | HR 88 | Temp 97.5°F | Resp 18 | Ht 64.0 in | Wt 136.0 lb

## 2020-11-30 DIAGNOSIS — Z79899 Other long term (current) drug therapy: Secondary | ICD-10-CM | POA: Diagnosis not present

## 2020-11-30 DIAGNOSIS — Z170 Estrogen receptor positive status [ER+]: Secondary | ICD-10-CM | POA: Diagnosis not present

## 2020-11-30 DIAGNOSIS — C50512 Malignant neoplasm of lower-outer quadrant of left female breast: Secondary | ICD-10-CM

## 2020-11-30 LAB — CMP (CANCER CENTER ONLY)
ALT: 18 U/L (ref 0–44)
AST: 18 U/L (ref 15–41)
Albumin: 3.8 g/dL (ref 3.5–5.0)
Alkaline Phosphatase: 72 U/L (ref 38–126)
Anion gap: 8 (ref 5–15)
BUN: 20 mg/dL (ref 8–23)
CO2: 26 mmol/L (ref 22–32)
Calcium: 9 mg/dL (ref 8.9–10.3)
Chloride: 107 mmol/L (ref 98–111)
Creatinine: 1.17 mg/dL — ABNORMAL HIGH (ref 0.44–1.00)
GFR, Estimated: 52 mL/min — ABNORMAL LOW (ref >60)
Glucose, Bld: 75 mg/dL (ref 70–99)
Potassium: 4.3 mmol/L (ref 3.5–5.1)
Sodium: 141 mmol/L (ref 135–145)
Total Bilirubin: 0.6 mg/dL (ref 0.3–1.2)
Total Protein: 6.7 g/dL (ref 6.5–8.1)

## 2020-11-30 LAB — CBC WITH DIFFERENTIAL (CANCER CENTER ONLY)
Abs Immature Granulocytes: 0.02 10*3/uL (ref 0.00–0.07)
Basophils Absolute: 0 10*3/uL (ref 0.0–0.1)
Basophils Relative: 1 %
Eosinophils Absolute: 0.2 10*3/uL (ref 0.0–0.5)
Eosinophils Relative: 4 %
HCT: 38.3 % (ref 36.0–46.0)
Hemoglobin: 12.8 g/dL (ref 12.0–15.0)
Immature Granulocytes: 0 %
Lymphocytes Relative: 23 %
Lymphs Abs: 1.3 10*3/uL (ref 0.7–4.0)
MCH: 30.8 pg (ref 26.0–34.0)
MCHC: 33.4 g/dL (ref 30.0–36.0)
MCV: 92.1 fL (ref 80.0–100.0)
Monocytes Absolute: 0.5 10*3/uL (ref 0.1–1.0)
Monocytes Relative: 8 %
Neutro Abs: 3.8 10*3/uL (ref 1.7–7.7)
Neutrophils Relative %: 64 %
Platelet Count: 223 10*3/uL (ref 150–400)
RBC: 4.16 MIL/uL (ref 3.87–5.11)
RDW: 12.6 % (ref 11.5–15.5)
WBC Count: 5.9 10*3/uL (ref 4.0–10.5)
nRBC: 0 % (ref 0.0–0.2)

## 2020-12-02 ENCOUNTER — Telehealth: Payer: Self-pay | Admitting: Oncology

## 2020-12-02 NOTE — Telephone Encounter (Signed)
Scheduled appointment per 06/28 los. Patient is aware.

## 2020-12-03 ENCOUNTER — Telehealth: Payer: Self-pay | Admitting: *Deleted

## 2020-12-03 ENCOUNTER — Encounter: Payer: Self-pay | Admitting: *Deleted

## 2020-12-03 NOTE — Telephone Encounter (Signed)
Left message for patient to follow up after new patient appt. Left contact information on her identified voicemail

## 2020-12-08 ENCOUNTER — Ambulatory Visit: Payer: PPO

## 2020-12-08 ENCOUNTER — Ambulatory Visit: Payer: PPO | Admitting: Radiation Oncology

## 2020-12-08 ENCOUNTER — Ambulatory Visit
Admission: RE | Admit: 2020-12-08 | Discharge: 2020-12-08 | Disposition: A | Discharge status: Home / Self Care | Payer: Medicare Other | Source: Ambulatory Visit | Attending: General Surgery | Admitting: General Surgery

## 2020-12-08 DIAGNOSIS — C50912 Malignant neoplasm of unspecified site of left female breast: Secondary | ICD-10-CM | POA: Diagnosis not present

## 2020-12-08 DIAGNOSIS — C50512 Malignant neoplasm of lower-outer quadrant of left female breast: Secondary | ICD-10-CM

## 2020-12-08 DIAGNOSIS — Z170 Estrogen receptor positive status [ER+]: Secondary | ICD-10-CM

## 2020-12-08 MED ORDER — GADOBUTROL 1 MMOL/ML IV SOLN
6.0000 mL | Freq: Once | INTRAVENOUS | Status: AC | PRN
  Administered 2020-12-08: 6 mL via INTRAVENOUS

## 2020-12-08 NOTE — Progress Notes (Signed)
New Breast Cancer Diagnosis: Left Breast  Did patient present with symptoms (if so, please note symptoms) or screening mammography?:Screening Mass    Location and Extent of disease :left breast. Located at 3:30 position, measured  0.9 cm in greatest dimension. Adenopathy no.  Histology per Pathology Report: grade 2, Invasive Lobular Carcinoma 11/16/2020  Receptor Status: ER(positive), PR (positive), Her2-neu (negative), Ki-(1%)  Surgeon and surgical plan, if any: Dr. Barry Dienes -Left Breast Lumpectomy with radioactive seed and SLN Biopsy 12/29/2020  Medical oncologist, treatment if any:   Dr. Jana Hakim 11/30/2020 -lobular breast cells tend to not perform masses of cells, but rather infiltrating tissues.  They are much harder to see on mammography and that is why she is having a breast MRI prior to her surgery. -I am not planning to request an Oncotype unless the tumor proved to be larger than expected on final determination -The plan then is for surgery, radiation, and antiestrogens and specifically I think she would be a good candidate for tamoxifen.   Family History of Breast/Ovarian/Prostate Cancer: None  Lymphedema issues, if any: No   Pain issues, if any: No pain or tenderness noted within the biopsy site.  She reports since her MRI breast yesterday she has developed a rash on her breast and some bruising.   SAFETY ISSUES: Prior radiation? No Pacemaker/ICD? No Possible current pregnancy? Postmenopausal Is the patient on methotrexate? No  Current Complaints / other details:

## 2020-12-09 ENCOUNTER — Ambulatory Visit
Admission: RE | Admit: 2020-12-09 | Discharge: 2020-12-09 | Disposition: A | Discharge status: Home / Self Care | Payer: Medicare Other | Source: Ambulatory Visit | Attending: Radiation Oncology | Admitting: Radiation Oncology

## 2020-12-09 ENCOUNTER — Encounter: Payer: Self-pay | Admitting: *Deleted

## 2020-12-09 ENCOUNTER — Other Ambulatory Visit: Payer: Self-pay

## 2020-12-09 ENCOUNTER — Encounter: Payer: Self-pay | Admitting: Radiation Oncology

## 2020-12-09 VITALS — BP 101/59 | HR 70 | Temp 97.6°F | Resp 18 | Ht 64.0 in | Wt 136.0 lb

## 2020-12-09 DIAGNOSIS — Z809 Family history of malignant neoplasm, unspecified: Secondary | ICD-10-CM | POA: Insufficient documentation

## 2020-12-09 DIAGNOSIS — Z79899 Other long term (current) drug therapy: Secondary | ICD-10-CM | POA: Insufficient documentation

## 2020-12-09 DIAGNOSIS — Z170 Estrogen receptor positive status [ER+]: Secondary | ICD-10-CM | POA: Diagnosis not present

## 2020-12-09 DIAGNOSIS — C50512 Malignant neoplasm of lower-outer quadrant of left female breast: Secondary | ICD-10-CM | POA: Diagnosis not present

## 2020-12-09 DIAGNOSIS — M8580 Other specified disorders of bone density and structure, unspecified site: Secondary | ICD-10-CM | POA: Diagnosis not present

## 2020-12-12 NOTE — Progress Notes (Signed)
Radiation Oncology         [phone] ________________________________  Name: Katrina Williamson MRN: [id number]  Date: 12/09/2020  DOB: 12/29/1955  CC:Leamon Arnt, MD  Stark Klein, MD     REFERRING PHYSICIAN: Stark Klein, MD   DIAGNOSIS: The encounter diagnosis was Malignant neoplasm of lower-outer quadrant of left breast of female, estrogen receptor positive ([place]).   HISTORY OF PRESENT ILLNESS::Katrina Williamson is a 65 y.o. female who is seen for an initial consultation visit regarding the patient's diagnosis of left-sided breast cancer.  The patient was found to have a suspicious abnormality on a screening mammogram.  Further work-up revealed a 0.9 cm tumor the 30 o'clock position within the left breast.  No axillary adenopathy was seen on this side.  Biopsy was performed and this revealed a grade 2 invasive lobular carcinoma.  Receptor studies indicated that the tumor is estrogen receptor positive, progesterone receptor positive, and HER2/neu negative.  Ki-67 staining was 1%.  The patient is planning to proceed with a lumpectomy and I have been asked to see the patient for consideration of adjuvant radiation treatment.    PREVIOUS RADIATION THERAPY: No   PAST MEDICAL HISTORY:  has a past medical history of Allergies, Allergy (1990), Anxiety (2006), Breast cancer ([place]) (11/17/20), Breast cancer in female [facility]), Frequent headaches, Herpes simplex dermatitis of eyelid (02/17/2019), Osteopenia after menopause (03/14/2018), and Postmenopausal HRT (hormone replacement therapy) (03/14/2018).     PAST SURGICAL HISTORY:History reviewed. No pertinent surgical history.   FAMILY HISTORY: family history includes Alcohol abuse in her sister; Arthritis in her mother and sister; Cancer in her sister; Hearing loss in her mother; Stroke in her father.   SOCIAL HISTORY:  reports that she has never smoked. She has never used smokeless tobacco. She reports current alcohol use of about 2.0 standard  drinks of alcohol per week. She reports previous drug use. Drug: Marijuana.   ALLERGIES: Patient has no known allergies.   MEDICATIONS:  Current Outpatient Medications  Medication Sig Dispense Refill   albuterol (VENTOLIN HFA) 108 (90 Base) MCG/ACT inhaler Inhale 2 puffs into the lungs every 4 (four) hours as needed for wheezing or shortness of breath. 1 each 2   Calcium Citrate-Vitamin D (CITRACAL PETITES/VITAMIN D) 200-250 MG-UNIT TABS Take 3 tabs twice a day 60 tablet    ciclopirox (PENLAC) 8 % solution Apply 1 application topically daily.     sertraline (ZOLOFT) 50 MG tablet TAKE 1 TABLET BY MOUTH EVERY DAY 90 tablet 3   No current facility-administered medications for this encounter.     REVIEW OF SYSTEMS:  A 15 point review of systems is documented in the electronic medical record. This was obtained by the nursing staff. However, I reviewed this with the patient to discuss relevant findings and make appropriate changes.  Pertinent items are noted in HPI.    PHYSICAL EXAM:  height is '5\' 4"'  (1.626 m) and weight is 136 lb (61.7 kg). Her temperature is 97.6 F (36.4 C). Her blood pressure is 101/59 (abnormal) and her pulse is 70. Her respiration is 18 and oxygen saturation is 100%.   ECOG = 1  0 - Asymptomatic (Fully active, able to carry on all predisease activities without restriction)  1 - Symptomatic but completely ambulatory (Restricted in physically strenuous activity but ambulatory and able to carry out work of a light or sedentary nature. For example, light housework, office work)  2 - Symptomatic, <50% in bed during the day (Ambulatory and capable of all self  care but unable to carry out any work activities. Up and about more than 50% of waking hours)  3 - Symptomatic, >50% in bed, but not bedbound (Capable of only limited self-care, confined to bed or chair 50% or more of waking hours)  4 - Bedbound (Completely disabled. Cannot carry on any self-care. Totally confined to  bed or chair)  5 - Death   Eustace Pen MM, Creech RH, Tormey DC, et al. [phone]). "Toxicity and response criteria of the [facility]". [place] Oncol. 5 (6): 649-55  Alert, no distress   LABORATORY DATA:  Lab Results  Component Value Date   WBC 5.9 11/30/2020   HGB 12.8 11/30/2020   HCT 38.3 11/30/2020   MCV 92.1 11/30/2020   PLT 223 11/30/2020   Lab Results  Component Value Date   NA 141 11/30/2020   K 4.3 11/30/2020   CL 107 11/30/2020   CO2 26 11/30/2020   Lab Results  Component Value Date   ALT 18 11/30/2020   AST 18 11/30/2020   ALKPHOS 72 11/30/2020   BILITOT 0.6 11/30/2020      RADIOGRAPHY: MR BREAST BILATERAL W WO CONTRAST INC CAD  Result Date: 12/08/2020 CLINICAL DATA:  Staging exam. Recently diagnosed left breast invasive mammary carcinoma. LABS:  None. EXAM: BILATERAL BREAST MRI WITH AND WITHOUT CONTRAST TECHNIQUE: Multiplanar, multisequence MR images of both breasts were obtained prior to and following the intravenous administration of 6 ml of Gadavist Three-dimensional MR images were rendered by post-processing of the original MR data on an independent workstation. The three-dimensional MR images were interpreted, and findings are reported in the following complete MRI report for this study. Three dimensional images were evaluated at the independent interpreting workstation using the DynaCAD thin client. COMPARISON:  None. FINDINGS: Breast composition: c. Heterogeneous fibroglandular tissue. Background parenchymal enhancement: Minimal Right breast: No mass or abnormal enhancement. Left breast: There is susceptibility artifact in the outer left breast middle depth consistent with a biopsy marking clip. There is an associated irregular enhancing mass measuring 1.0 x 1.0 X 1.1 cm (series 9, image 90), consistent with biopsy proven malignancy. No additional abnormal areas of enhancement. Lymph nodes: No abnormal appearing lymph nodes. Ancillary findings:   None. IMPRESSION: 1. Biopsy-proven malignancy in the outer LEFT breast measuring 1.0 x 1.0 x 1.1 cm. 2. No MRI evidence of malignancy in the RIGHT breast. RECOMMENDATION: Continue with surgical/oncological treatment for the known left breast cancer. BI-RADS CATEGORY  6: Known biopsy-proven malignancy. Electronically Signed   By: Audie Pinto M.D.   On: 12/08/2020 13:22  US BREAST LTD UNI LEFT INC AXILLA  Result Date: 11/15/2020 CLINICAL DATA:  65 year old female presenting as a recall from screening for possible left breast asymmetry. EXAM: DIGITAL DIAGNOSTIC UNILATERAL LEFT MAMMOGRAM WITH TOMOSYNTHESIS AND CAD; ULTRASOUND LEFT BREAST LIMITED TECHNIQUE: Left digital diagnostic mammography and breast tomosynthesis was performed. The images were evaluated with computer-aided detection.; Targeted ultrasound examination of the left breast was performed COMPARISON:  Previous exam(s). ACR Breast Density Category c: The breast tissue is heterogeneously dense, which may obscure small masses. FINDINGS: Mammogram: Full field tomosynthesis views as well as spot compression tomosynthesis views of the left breast were performed. There is persistence of a mass with distortion in the outer left breast measuring approximately 0.7 cm. On physical exam of the outer left breast I feel a focal area of thickening. Ultrasound: Targeted ultrasound performed in the left breast at 3:30 o'clock 3 cm from the nipple demonstrating an irregular  hypoechoic mass measuring 0.8 x 0.9 x 0.7 cm. This corresponds to the mammographic findings. There is a nearby benign intramammary lymph node measuring 0.3 x 0.2 x 0.3 cm. Targeted ultrasound the left axilla demonstrates normal lymph nodes. IMPRESSION: Suspicious mass in the left breast at 3:30 o'clock measuring 0.9 cm. RECOMMENDATION: Ultrasound-guided core needle biopsy of the left breast mass at 3:30 o'clock. I have discussed the findings and recommendations with the patient who agrees to  proceed with biopsy. The patient will be scheduled for the biopsy appointment prior to leaving the office today. BI-RADS CATEGORY  4: Suspicious. Electronically Signed   By: Audie Pinto M.D.   On: 11/15/2020 13:52  MM DIAG BREAST TOMO UNI LEFT  Result Date: 11/15/2020 CLINICAL DATA:  65 year old female presenting as a recall from screening for possible left breast asymmetry. EXAM: DIGITAL DIAGNOSTIC UNILATERAL LEFT MAMMOGRAM WITH TOMOSYNTHESIS AND CAD; ULTRASOUND LEFT BREAST LIMITED TECHNIQUE: Left digital diagnostic mammography and breast tomosynthesis was performed. The images were evaluated with computer-aided detection.; Targeted ultrasound examination of the left breast was performed COMPARISON:  Previous exam(s). ACR Breast Density Category c: The breast tissue is heterogeneously dense, which may obscure small masses. FINDINGS: Mammogram: Full field tomosynthesis views as well as spot compression tomosynthesis views of the left breast were performed. There is persistence of a mass with distortion in the outer left breast measuring approximately 0.7 cm. On physical exam of the outer left breast I feel a focal area of thickening. Ultrasound: Targeted ultrasound performed in the left breast at 3:30 o'clock 3 cm from the nipple demonstrating an irregular hypoechoic mass measuring 0.8 x 0.9 x 0.7 cm. This corresponds to the mammographic findings. There is a nearby benign intramammary lymph node measuring 0.3 x 0.2 x 0.3 cm. Targeted ultrasound the left axilla demonstrates normal lymph nodes. IMPRESSION: Suspicious mass in the left breast at 3:30 o'clock measuring 0.9 cm. RECOMMENDATION: Ultrasound-guided core needle biopsy of the left breast mass at 3:30 o'clock. I have discussed the findings and recommendations with the patient who agrees to proceed with biopsy. The patient will be scheduled for the biopsy appointment prior to leaving the office today. BI-RADS CATEGORY  4: Suspicious. Electronically  Signed   By: Audie Pinto M.D.   On: 11/15/2020 13:52  MM CLIP PLACEMENT LEFT  Result Date: 11/16/2020 CLINICAL DATA:  Post biopsy mammogram of the left breast for clip placement. EXAM: DIAGNOSTIC LEFT MAMMOGRAM POST ULTRASOUND BIOPSY COMPARISON:  Previous exam(s). FINDINGS: Mammographic images were obtained following ultrasound guided biopsy of a mass in the left breast at 3:30. The biopsy marking clip is in expected position at the site of biopsy. IMPRESSION: Appropriate positioning of the heart shaped biopsy marking clip at the site of biopsy in the slightly lower outer left breast. Final Assessment: Post Procedure Mammograms for Marker Placement Electronically Signed   By: Ammie Ferrier M.D.   On: 11/16/2020 09:22  [place] LT BREAST BX W LOC DEV 1ST LESION IMG BX SPEC US GUIDE  Addendum Date: 11/18/2020   ADDENDUM REPORT: 11/18/2020 08:14 ADDENDUM: Pathology revealed GRADE II INVASIVE MAMMARY CARCINOMA, MAMMARY CARCINOMA IN SITU of the Left breast, 3:30 o'clock, 3 cmfn, (heart shaped clip). This was found to be concordant by Dr. Ammie Ferrier. Pathology results were discussed with the patient by telephone. The patient reported doing well after the biopsy with tenderness at the site. Post biopsy instructions and care were reviewed and questions were answered. The patient was encouraged to call [facility]  for any additional concerns. My direct phone number was provided. Surgical consultation has been arranged with Dr. Stark Klein at [facility] on November 22, 2020. Pathology results reported by Terie Purser, RN on 11/18/2020. Electronically Signed   By: Ammie Ferrier M.D.   On: 11/18/2020 08:14   Result Date: 11/18/2020 CLINICAL DATA:  65 year old female presenting for ultrasound-guided biopsy of a left breast mass. EXAM: ULTRASOUND GUIDED LEFT BREAST CORE NEEDLE BIOPSY COMPARISON:  Previous exam(s). PROCEDURE: I met with the patient and we discussed the  procedure of ultrasound-guided biopsy, including benefits and alternatives. We discussed the high likelihood of a successful procedure. We discussed the risks of the procedure, including infection, bleeding, tissue injury, clip migration, and inadequate sampling. Informed written consent was given. The usual time-out protocol was performed immediately prior to the procedure. Lesion quadrant: Lower outer quadrant Using sterile technique and 1% Lidocaine as local anesthetic, under direct ultrasound visualization, a 14 gauge spring-loaded device was used to perform biopsy of a mass in the left breast at 3:30, 3 cm from the nipple using an inferior approach. At the conclusion of the procedure a heart shaped tissue marker clip was deployed into the biopsy cavity. Follow up 2 view mammogram was performed and dictated separately. IMPRESSION: Ultrasound guided biopsy of a left breast mass at 3:30. No apparent complications. Electronically Signed: By: Ammie Ferrier M.D. On: 11/16/2020 09:08      IMPRESSION/ PLAN:  The patient underwent evaluation for her diagnosis of a T1 N0 M0 invasive lobular carcinoma left breast.  She is planning to proceed with.  I therefore discussed a recommendation of an adjuvant course of radiation treatment subsequently.  We discussed a 4-week course of treatment to the left breast.  I discussed with the patient the rationale of treatment to improve local control.  We also discussed the possible side effects and risks of treatment as well.  All of her questions were answered and she stated that she did wish to proceed with such treatment at the appropriate time.  I will forward to seeing her postoperatively to further evaluate her case and coordinate her care at that time.  The patient was seen in person today in clinic.  The total time spent on the patient's visit today was 45 minutes, including chart review, direct discussion/evaluation with the patient, and coordination of  care.        ________________________________   Jodelle Gross, MD, PhD   **Disclaimer: This note was dictated with voice recognition software. Similar sounding words can inadvertently be transcribed and this note may contain transcription errors which may not have been corrected upon publication of note.**

## 2020-12-14 ENCOUNTER — Encounter: Payer: Self-pay | Admitting: Licensed Clinical Social Worker

## 2020-12-14 NOTE — Progress Notes (Signed)
[place] Work  Clinical Social Work was referred by new patient protocol for assessment of psychosocial needs.  Clinical Social Worker contacted patient by phone  to offer support and assess for needs.   Patient reports to be doing great right now. She has strong support in her life and denies any resource needs.  CSW introduced self and support services available. Pt is interested in seeing information on yoga/ tai chi. CSW sent calendars for [facility] and [employer].     [place], [place] Worker [employer]

## 2020-12-20 NOTE — Pre-Procedure Instructions (Signed)
Surgical Instructions    Your procedure is scheduled on 12/29/20.  Report to [facility] Main Entrance "A" at 1030 A.M., then check in with the Admitting office.  Call this number if you have problems the morning of surgery:  [phone]   If you have any questions prior to your surgery date call [phone]: Open Monday-Friday 8am-4pm    Remember:  Do not eat after midnight the night before your surgery  You may drink clear liquids until 0930 the morning of your surgery.   Clear liquids allowed are: Water, Non-Citrus Juices (without pulp), Carbonated Beverages, Clear Tea, Black Coffee Only, and Gatorade  Please complete your PRE-SURGERY ENSURE that was provided to you by ... the morning of surgery.  Please, if able, drink it in one setting. DO NOT SIP.     Take these medicines the morning of surgery with A SIP OF WATER  sertraline (ZOLOFT) albuterol (VENTOLIN HFA) if needed Please bring all inhalers with you the day of surgery.   As of today, STOP taking any Aspirin (unless otherwise instructed by your surgeon) Aleve, Naproxen, Ibuprofen, Motrin, Advil, Goody's, BC's, all herbal medications, fish oil, and all vitamins.          Do not wear jewelry or makeup Do not wear lotions, powders, perfumes/colognes, or deodorant. Do not shave 48 hours prior to surgery.  Men may shave face and neck. Do not bring valuables to the hospital. DO Not wear nail polish, gel polish, artificial nails, or any other type of covering on  natural nails including finger and toenails. If patients have artificial nails, gel coating, etc. that need to be removed by a nail salon please have this removed prior to surgery or surgery may need to be canceled/delayed if the surgeon/ anesthesia feels like the patient is unable to be adequately monitored.             [facility] is not responsible for any belongings or valuables.  Do NOT Smoke (Tobacco/Vaping) or drink Alcohol 24 hours prior to your procedure If you  use a CPAP at night, you may bring all equipment for your overnight stay.   Contacts, glasses, dentures or bridgework may not be worn into surgery, please bring cases for these belongings   For patients admitted to the hospital, discharge time will be determined by your treatment team.   Patients discharged the day of surgery will not be allowed to drive home, and someone needs to stay with them for 24 hours.  ONLY 1 SUPPORT PERSON MAY BE PRESENT WHILE YOU ARE IN SURGERY. IF YOU ARE TO BE ADMITTED ONCE YOU ARE IN YOUR ROOM YOU WILL BE ALLOWED TWO (2) VISITORS.  Minor children may have two parents present. Special consideration for safety and communication needs will be reviewed on a case by case basis.  Special instructions:    Oral Hygiene is also important to reduce your risk of infection.  Remember - BRUSH YOUR TEETH THE MORNING OF SURGERY WITH YOUR REGULAR TOOTHPASTE   [facility]- Preparing For Surgery  Before surgery, you can play an important role. Because skin is not sterile, your skin needs to be as free of germs as possible. You can reduce the number of germs on your skin by washing with CHG (chlorahexidine gluconate) Soap before surgery.  CHG is an antiseptic cleaner which kills germs and bonds with the skin to continue killing germs even after washing.     Please do not use if you have an allergy  to CHG or antibacterial soaps. If your skin becomes reddened/irritated stop using the CHG.  Do not shave (including legs and underarms) for at least 48 hours prior to first CHG shower. It is OK to shave your face.  Please follow these instructions carefully.     Shower the NIGHT BEFORE SURGERY and the MORNING OF SURGERY with CHG Soap.   If you chose to wash your hair, wash your hair first as usual with your normal shampoo. After you shampoo, rinse your hair and body thoroughly to remove the shampoo.  Then [employer] and genitals (private parts) with your normal soap and rinse thoroughly  to remove soap.  After that Use CHG Soap as you would any other liquid soap. You can apply CHG directly to the skin and wash gently with a scrungie or a clean washcloth.   Apply the CHG Soap to your body ONLY FROM THE NECK DOWN.  Do not use on open wounds or open sores. Avoid contact with your eyes, ears, mouth and genitals (private parts). Wash Face and genitals (private parts)  with your normal soap.   Wash thoroughly, paying special attention to the area where your surgery will be performed.  Thoroughly rinse your body with warm water from the neck down.  DO NOT shower/wash with your normal soap after using and rinsing off the CHG Soap.  Pat yourself dry with a CLEAN TOWEL.  Wear CLEAN PAJAMAS to bed the night before surgery  Place CLEAN SHEETS on your bed the night before your surgery  DO NOT SLEEP WITH PETS.   Day of Surgery:  Take a shower with CHG soap. Wear Clean/Comfortable clothing the morning of surgery Do not apply any deodorants/lotions.   Remember to brush your teeth WITH YOUR REGULAR TOOTHPASTE.   Please read over the following fact sheets that you were given.

## 2020-12-21 ENCOUNTER — Encounter (HOSPITAL_COMMUNITY): Payer: Self-pay

## 2020-12-21 ENCOUNTER — Other Ambulatory Visit: Payer: Self-pay

## 2020-12-21 ENCOUNTER — Encounter (HOSPITAL_COMMUNITY)
Admission: RE | Admit: 2020-12-21 | Discharge: 2020-12-21 | Disposition: A | Discharge status: Home / Self Care | Payer: Medicare Other | Source: Ambulatory Visit | Attending: General Surgery | Admitting: General Surgery

## 2020-12-21 DIAGNOSIS — Z01812 Encounter for preprocedural laboratory examination: Secondary | ICD-10-CM | POA: Diagnosis not present

## 2020-12-21 HISTORY — DX: Unspecified asthma, uncomplicated: J45.909

## 2020-12-21 LAB — CBC WITH DIFFERENTIAL/PLATELET
Abs Immature Granulocytes: 0.02 10*3/uL (ref 0.00–0.07)
Basophils Absolute: 0.1 10*3/uL (ref 0.0–0.1)
Basophils Relative: 1 %
Eosinophils Absolute: 0.2 10*3/uL (ref 0.0–0.5)
Eosinophils Relative: 4 %
HCT: 40.8 % (ref 36.0–46.0)
Hemoglobin: 13.4 g/dL (ref 12.0–15.0)
Immature Granulocytes: 0 %
Lymphocytes Relative: 26 %
Lymphs Abs: 1.6 10*3/uL (ref 0.7–4.0)
MCH: 30.4 pg (ref 26.0–34.0)
MCHC: 32.8 g/dL (ref 30.0–36.0)
MCV: 92.5 fL (ref 80.0–100.0)
Monocytes Absolute: 0.5 10*3/uL (ref 0.1–1.0)
Monocytes Relative: 8 %
Neutro Abs: 3.6 10*3/uL (ref 1.7–7.7)
Neutrophils Relative %: 61 %
Platelets: 247 10*3/uL (ref 150–400)
RBC: 4.41 MIL/uL (ref 3.87–5.11)
RDW: 12.3 % (ref 11.5–15.5)
WBC: 6 10*3/uL (ref 4.0–10.5)
nRBC: 0 % (ref 0.0–0.2)

## 2020-12-21 LAB — PROTIME-INR
INR: 1.1 (ref 0.8–1.2)
Prothrombin Time: 14.1 seconds (ref 11.4–15.2)

## 2020-12-21 LAB — COMPREHENSIVE METABOLIC PANEL
ALT: 25 U/L (ref 0–44)
AST: 24 U/L (ref 15–41)
Albumin: 4.1 g/dL (ref 3.5–5.0)
Alkaline Phosphatase: 69 U/L (ref 38–126)
Anion gap: 5 (ref 5–15)
BUN: 17 mg/dL (ref 8–23)
CO2: 30 mmol/L (ref 22–32)
Calcium: 10.1 mg/dL (ref 8.9–10.3)
Chloride: 103 mmol/L (ref 98–111)
Creatinine, Ser: 1.04 mg/dL — ABNORMAL HIGH (ref 0.44–1.00)
GFR, Estimated: 60 mL/min — ABNORMAL LOW (ref >60)
Glucose, Bld: 103 mg/dL — ABNORMAL HIGH (ref 70–99)
Potassium: 4.2 mmol/L (ref 3.5–5.1)
Sodium: 138 mmol/L (ref 135–145)
Total Bilirubin: 0.8 mg/dL (ref 0.3–1.2)
Total Protein: 6.5 g/dL (ref 6.5–8.1)

## 2020-12-21 NOTE — Progress Notes (Signed)
PCP - Billey Chang, MD Cardiologist - denies  PPM/ICD - denies Device Orders - N/A Rep Notified - N/A  Chest x-ray - N/A EKG - N/A Stress Test - denies ECHO - denies Cardiac Cath - denies  Sleep Study - denies CPAP - N/A  Fasting Blood Sugar - N/A  Blood Thinner Instructions: N/A  Aspirin Instructions: patient was instructed: As of today, STOP taking any Aspirin (unless otherwise instructed by your surgeon) Aleve, Naproxen, Ibuprofen, Motrin, Advil, Goody's, BC's, all herbal medications, fish oil, and all vitamins  ERAS Protcol - yes PRE-SURGERY Ensure - yes  COVID TEST- N/A - ambulatory surgery   Anesthesia review: yes; left seed localized lumpectomy  Patient denies shortness of breath, fever, cough and chest pain at PAT appointment   All instructions explained to the patient, with a verbal understanding of the material. Patient agrees to go over the instructions while at home for a better understanding. Patient also instructed to self quarantine after being tested for COVID-19. The opportunity to ask questions was provided.

## 2020-12-27 NOTE — H&P (Signed)
Katrina Williamson Location: [facility] Patient #: [id number] DOB: [birth year] Divorced / Language: Cleophus Molt / Race: White Female   History of Present Illness The patient is a 65 year old female who presents with breast cancer.Pt is a lovely 65 yo F who presents with a new dx of left breast cancer 11/2020.  She had screening detected left breast asymmetry.  Diagnostic imaging showed a mass with distortion and irregular hypoechoic characteristics at 3:30 measuring 9 mm by u/s and 7 mm by mammography.  A benign intramammary node was also seen.  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  This is lobular phenotype.  Receptors are +/+/-, with Ki 67 of 1 %.  She denies prior breast issues.    She is nulliparous.  She had menarche at age 29 and menopause <age 53.  She has been on HRT for many years.  She is retired, but enjoys exercising.  Her sister had uterine cancer, but no other cancers run in her family.  Her sister and father also had colon polyps.    dx mammogram/us 11/15/20 ACR Breast Density Category c: The breast tissue is heterogeneously dense, which may obscure small masses.  FINDINGS: Mammogram:  Full field tomosynthesis views as well as spot compression tomosynthesis views of the left breast were performed. There is persistence of a mass with distortion in the outer left breast measuring approximately 0.7 cm.  On physical exam of the outer left breast I feel a focal area of thickening.  Ultrasound:  Targeted ultrasound performed in the left breast at 3:30 o'clock 3 cm from the nipple demonstrating an irregular hypoechoic mass measuring 0.8 x 0.9 x 0.7 cm. This corresponds to the mammographic findings.  There is a nearby benign intramammary lymph node measuring 0.3 x 0.2 x 0.3 cm.  Targeted ultrasound the left axilla demonstrates normal lymph nodes.  IMPRESSION: Suspicious mass in the left breast  at 3:30 o'clock measuring 0.9 cm.  RECOMMENDATION: Ultrasound-guided core needle biopsy of the left breast mass at 3:30 o'clock.  I have discussed the findings and recommendations with the patient who agrees to proceed with biopsy. The patient will be scheduled for the biopsy appointment prior to leaving the office today.  BI-RADS CATEGORY  4: Suspicious.  pathology needle biopsy 11/16/20 Breast, left, needle core biopsy, 3:30 o'clock, 3cmfn (heart shaped clip) - INVASIVE MAMMARY CARCINOMA - MAMMARY CARCINOMA IN SITU E-cadherin is NEGATIVE supporting lobular origin - SEE COMMENT Microscopic Comment The biopsy material shows an infiltrative proliferation of cells with large vesicular nuclei with inconspicuous nucleoli, arranged linearly and in small clusters. Based on the biopsy, the carcinoma appears Nottingham grade 2 of 3 and measures 0.8 cm in greatest linear extent. The tumor cells are NEGATIVE for Her2 (1+). Estrogen Receptor: 95%, POSITIVE, STRONG STAINING INTENSITY Progesterone Receptor: 90%, POSITIVE, MODERATE STAINING INTENSITY Proliferation Marker Ki67: 1%      Past Surgical History Breast Biopsy   Left. Oral Surgery    Diagnostic Studies History  Colonoscopy   1-5 years ago Mammogram   within last year Pap Smear   1-5 years ago  Allergies  No Known Drug Allergies  Allergies Reconciled    Medication History Estradiol  (0.05MG/24HR Patch TW, Transdermal) Active. Progesterone  (100MG Capsule, Oral) Active. Sertraline HCl  (50MG Tablet, Oral) Active. Ciclopirox  (8% Solution, External) Active. Flonase Allergy Relief  (50MCG/ACT Suspension, Nasal) Active. Medications Reconciled   Social History  Alcohol use  Occasional alcohol use. Caffeine use   Tea. No drug use   Tobacco use   Never smoker.  Family History  Alcohol Abuse   Sister. Arthritis   Mother, Sister. Cancer   Sister. Cerebrovascular Accident   Father. Colon Polyps   Father,  Sister. Ischemic Bowel Disease   Father. Thyroid problems   Sister.  Pregnancy / Birth History  Age at menarche   44 years. Age of menopause   <45 Contraceptive History   Oral contraceptives. Gravida   0 Para   0  Other Problems Emeline Gins, [place]; 11/22/2020 3:16 PM) Anxiety Disorder   Back Pain   Breast Cancer   Lump In Breast      Review of Systems General Not Present- Appetite Loss, Chills, Fatigue, Fever, Night Sweats, Weight Gain and Weight Loss. Skin Not Present- Change in Wart/Mole, Dryness, Hives, Jaundice, New Lesions, Non-Healing Wounds, Rash and Ulcer. HEENT Present- Seasonal Allergies and Wears glasses/contact lenses. Not Present- Earache, Hearing Loss, Hoarseness, Nose Bleed, Oral Ulcers, Ringing in the Ears, Sinus Pain, Sore Throat, Visual Disturbances and Yellow Eyes. Respiratory Present- Snoring. Not Present- Bloody sputum, Chronic Cough, Difficulty Breathing and Wheezing. Breast Present- Breast Mass. Not Present- Breast Pain, Nipple Discharge and Skin Changes. Cardiovascular Not Present- Chest Pain, Difficulty Breathing Lying Down, Leg Cramps, Palpitations, Rapid Heart Rate, Shortness of Breath and Swelling of Extremities. Gastrointestinal Not Present- Abdominal Pain, Bloating, Bloody Stool, Change in Bowel Habits, Chronic diarrhea, Constipation, Difficulty Swallowing, Excessive gas, Gets full quickly at meals, Hemorrhoids, Indigestion, Nausea, Rectal Pain and Vomiting. Female Genitourinary Not Present- Frequency, Nocturia, Painful Urination, Pelvic Pain and Urgency. Musculoskeletal Present- Back Pain. Not Present- Joint Pain, Joint Stiffness, Muscle Pain, Muscle Weakness and Swelling of Extremities. Neurological Not Present- Decreased Memory, Fainting, Headaches, Numbness, Seizures, Tingling, Tremor, Trouble walking and Weakness. Psychiatric Not Present- Anxiety, Bipolar, Change in Sleep Pattern, Depression, Fearful and Frequent crying. Endocrine Not Present- Cold  Intolerance, Excessive Hunger, Hair Changes, Heat Intolerance, Hot flashes and New Diabetes. Hematology Not Present- Blood Thinners, Easy Bruising, Excessive bleeding, Gland problems, HIV and Persistent Infections.  Vitals  Weight: 136.4 lb   Height: 64 in  Body Surface Area: 1.66 m   Body Mass Index: 23.41 kg/m   Temp.: 98 F    Pulse: 79 (Regular)    BP: 110/70(Sitting, Left Arm, Standard)       Physical Exam  General Mental Status - Alert. General Appearance - Consistent with stated age. Hydration - Well hydrated. Voice - Normal.  Head and Neck Head - normocephalic, atraumatic with no lesions or palpable masses. Trachea - midline. Thyroid Gland Characteristics - normal size and consistency.  Eye Eyeball - Bilateral - Extraocular movements intact. Sclera/Conjunctiva - Bilateral - No scleral icterus.  Chest and Lung Exam Chest and lung exam reveals  - quiet, even and easy respiratory effort with no use of accessory muscles and on auscultation, normal breath sounds, no adventitious sounds and normal vocal resonance. Inspection Chest Wall - Normal. Back - normal.  Breast Note:  Breasts are relatively symmetric. No palpable masses are present. There is no nipple retraction or discharge. She has no skin dimpling. No LAD is present. breasts are relatively dense bilaterally.   Cardiovascular Cardiovascular examination reveals  - normal heart sounds, regular rate and rhythm with no murmurs and normal pedal pulses bilaterally.  Abdomen Inspection Inspection of the abdomen reveals - No Hernias. Palpation/Percussion Palpation and Percussion of the abdomen reveal - Soft, Non Tender, No Rebound tenderness, No Rigidity (guarding)  and No hepatosplenomegaly. Auscultation Auscultation of the abdomen reveals - Bowel sounds normal.  Neurologic Neurologic evaluation reveals  - alert and oriented x 3 with no impairment of recent or remote memory. Mental Status -  Normal.  Musculoskeletal Global Assessment  - Note:  no gross deformities.  Normal Exam - Left - Upper Extremity Strength Normal and Lower Extremity Strength Normal. Normal Exam - Right - Upper Extremity Strength Normal and Lower Extremity Strength Normal.  Lymphatic Head & Neck  General Head & Neck Lymphatics: Bilateral - Description - Normal. Axillary  General Axillary Region: Bilateral - Description - Normal. Tenderness - Non Tender. Femoral & Inguinal  Generalized Femoral & Inguinal Lymphatics: Bilateral - Description - No Generalized lymphadenopathy.    Assessment & Plan  MALIGNANT NEOPLASM OF LOWER-OUTER QUADRANT OF LEFT BREAST OF FEMALE, ESTROGEN RECEPTOR POSITIVE (C50.512) Impression: Pt has a new dx of left breast cancer, cT1bN0. Because this is a lobular cancer and she has breast density C, I will order a breast MRI.  As long as this doesn't show any surprises, I will plan seed localized lumpectomy and SLN bx. We discussed differences between this and mastectomy. I discussed that lumpectomy would be followed by radiation and antihormone tx. I have placed post op referrals for med onc and rad onc.  If advised her that there is a good change her MR would show other findings that need biopsies.  The surgical procedure was described to the patient. I discussed the incision type and location and that we would need radiology involved on with a wire or seed marker and/or sentinel node.  The risks and benefits of the procedure were described to the patient and she wishes to proceed.  We discussed the risks bleeding, infection, damage to other structures, need for further procedures/surgeries. We discussed the risk of seroma. The patient was advised if the area in the breast in cancer, we may need to go back to surgery for additional tissue to obtain negative margins or for a lymph node biopsy. The patient was advised that these are the most common complications, but that others can  occur as well. They were advised against taking aspirin or other anti-inflammatory agents/blood thinners the week before surgery. Current Plans MR BREAST BILAT WO&W CONTRAST (C8908) (lobular left breast cancer, breast density C) Referred to Oncology, for evaluation and follow up (Oncology). Routine. Referred to Radiation Oncology, for evaluation and follow up (Radiation Oncology). Routine. Pt Education - flb breast cancer surgery: discussed with patient and provided information. HORMONE REPLACEMENT THERAPY (HRT) (Z79.890) Impression: I have contacted Dr. Andy regarding whether we can stop her HRT abruptly or if it requires a taper.    

## 2020-12-28 ENCOUNTER — Other Ambulatory Visit: Payer: Self-pay

## 2020-12-28 ENCOUNTER — Ambulatory Visit
Admission: RE | Admit: 2020-12-28 | Discharge: 2020-12-28 | Disposition: A | Discharge status: Home / Self Care | Payer: Medicare Other | Source: Ambulatory Visit | Attending: General Surgery | Admitting: General Surgery

## 2020-12-28 DIAGNOSIS — C50512 Malignant neoplasm of lower-outer quadrant of left female breast: Secondary | ICD-10-CM

## 2020-12-28 DIAGNOSIS — C50912 Malignant neoplasm of unspecified site of left female breast: Secondary | ICD-10-CM | POA: Diagnosis not present

## 2020-12-29 ENCOUNTER — Ambulatory Visit (HOSPITAL_COMMUNITY)
Admission: RE | Admit: 2020-12-29 | Discharge: 2020-12-29 | Disposition: A | Discharge status: Home / Self Care | Payer: Medicare Other | Source: Ambulatory Visit | Attending: General Surgery | Admitting: General Surgery

## 2020-12-29 ENCOUNTER — Ambulatory Visit (HOSPITAL_COMMUNITY): Payer: Medicare Other | Admitting: Certified Registered"

## 2020-12-29 ENCOUNTER — Encounter (HOSPITAL_COMMUNITY): Payer: Self-pay | Admitting: General Surgery

## 2020-12-29 ENCOUNTER — Ambulatory Visit
Admission: RE | Admit: 2020-12-29 | Discharge: 2020-12-29 | Disposition: A | Discharge status: Home / Self Care | Payer: Medicare Other | Source: Ambulatory Visit | Attending: General Surgery | Admitting: General Surgery

## 2020-12-29 ENCOUNTER — Encounter (HOSPITAL_COMMUNITY)
Admission: RE | Disposition: A | Discharge status: Home / Self Care | Payer: Self-pay | Source: Home / Self Care | Attending: General Surgery

## 2020-12-29 ENCOUNTER — Ambulatory Visit (HOSPITAL_COMMUNITY): Payer: Medicare Other | Admitting: Physician Assistant

## 2020-12-29 ENCOUNTER — Ambulatory Visit (HOSPITAL_COMMUNITY)
Admission: RE | Admit: 2020-12-29 | Discharge: 2020-12-29 | Disposition: A | Discharge status: Home / Self Care | Payer: Medicare Other | Attending: General Surgery | Admitting: General Surgery

## 2020-12-29 DIAGNOSIS — G8918 Other acute postprocedural pain: Secondary | ICD-10-CM | POA: Diagnosis not present

## 2020-12-29 DIAGNOSIS — R928 Other abnormal and inconclusive findings on diagnostic imaging of breast: Secondary | ICD-10-CM | POA: Diagnosis not present

## 2020-12-29 DIAGNOSIS — C50512 Malignant neoplasm of lower-outer quadrant of left female breast: Secondary | ICD-10-CM | POA: Diagnosis not present

## 2020-12-29 DIAGNOSIS — Z79899 Other long term (current) drug therapy: Secondary | ICD-10-CM | POA: Diagnosis not present

## 2020-12-29 DIAGNOSIS — N6092 Unspecified benign mammary dysplasia of left breast: Secondary | ICD-10-CM | POA: Diagnosis not present

## 2020-12-29 DIAGNOSIS — Z79890 Hormone replacement therapy (postmenopausal): Secondary | ICD-10-CM | POA: Diagnosis not present

## 2020-12-29 DIAGNOSIS — M810 Age-related osteoporosis without current pathological fracture: Secondary | ICD-10-CM | POA: Diagnosis not present

## 2020-12-29 DIAGNOSIS — Z170 Estrogen receptor positive status [ER+]: Secondary | ICD-10-CM | POA: Insufficient documentation

## 2020-12-29 DIAGNOSIS — C50912 Malignant neoplasm of unspecified site of left female breast: Secondary | ICD-10-CM | POA: Diagnosis not present

## 2020-12-29 DIAGNOSIS — Z8049 Family history of malignant neoplasm of other genital organs: Secondary | ICD-10-CM | POA: Insufficient documentation

## 2020-12-29 DIAGNOSIS — N6489 Other specified disorders of breast: Secondary | ICD-10-CM | POA: Diagnosis not present

## 2020-12-29 HISTORY — PX: SENTINEL NODE BIOPSY: SHX6608

## 2020-12-29 HISTORY — PX: BREAST LUMPECTOMY WITH RADIOACTIVE SEED AND SENTINEL LYMPH NODE BIOPSY: SHX6550

## 2020-12-29 SURGERY — BREAST LUMPECTOMY WITH RADIOACTIVE SEED AND SENTINEL LYMPH NODE BIOPSY
Anesthesia: General | Site: Breast | Laterality: Left

## 2020-12-29 MED ORDER — LIDOCAINE 2% (20 MG/ML) 5 ML SYRINGE
INTRAMUSCULAR | Status: DC | PRN
  Administered 2020-12-29: 50 mg via INTRAVENOUS

## 2020-12-29 MED ORDER — CHLORHEXIDINE GLUCONATE 0.12 % MT SOLN
15.0000 mL | Freq: Once | OROMUCOSAL | Status: AC
  Administered 2020-12-29: 15 mL via OROMUCOSAL
  Filled 2020-12-29: qty 15

## 2020-12-29 MED ORDER — PROPOFOL 10 MG/ML IV BOLUS
INTRAVENOUS | Status: DC | PRN
Start: 2020-12-29 — End: 2020-12-29
  Administered 2020-12-29: 150 mg via INTRAVENOUS
  Administered 2020-12-29: 50 mg via INTRAVENOUS

## 2020-12-29 MED ORDER — SODIUM CHLORIDE FLUSH 0.9 % IV SOLN
INTRAVENOUS | Status: AC
  Filled 2020-12-29: qty 10

## 2020-12-29 MED ORDER — LIDOCAINE HCL 1 % IJ SOLN
INTRAMUSCULAR | Status: DC | PRN
  Administered 2020-12-29: 10 mL

## 2020-12-29 MED ORDER — CEFAZOLIN SODIUM-DEXTROSE 2-4 GM/100ML-% IV SOLN
2.0000 g | INTRAVENOUS | Status: AC
  Administered 2020-12-29: 2 g via INTRAVENOUS
  Filled 2020-12-29: qty 100

## 2020-12-29 MED ORDER — MIDAZOLAM HCL 2 MG/2ML IJ SOLN: 2.0000 mg | Freq: Once | INTRAMUSCULAR | Status: AC

## 2020-12-29 MED ORDER — CHLORHEXIDINE GLUCONATE CLOTH 2 % EX PADS: 6.0000 | MEDICATED_PAD | Freq: Once | CUTANEOUS | Status: DC

## 2020-12-29 MED ORDER — MIDAZOLAM HCL 2 MG/2ML IJ SOLN
INTRAMUSCULAR | Status: DC | PRN
  Administered 2020-12-29: 2 mg via INTRAVENOUS

## 2020-12-29 MED ORDER — ORAL CARE MOUTH RINSE: 15.0000 mL | Freq: Once | OROMUCOSAL | Status: AC

## 2020-12-29 MED ORDER — FENTANYL CITRATE (PF) 250 MCG/5ML IJ SOLN
INTRAMUSCULAR | Status: AC
  Filled 2020-12-29: qty 5

## 2020-12-29 MED ORDER — MIDAZOLAM HCL 2 MG/2ML IJ SOLN
INTRAMUSCULAR | Status: AC
  Filled 2020-12-29: qty 2

## 2020-12-29 MED ORDER — OXYCODONE HCL 5 MG PO TABS: 5.0000 mg | ORAL_TABLET | Freq: Four times a day (QID) | ORAL | 0 refills | Status: DC | PRN

## 2020-12-29 MED ORDER — LIDOCAINE HCL (PF) 1 % IJ SOLN
INTRAMUSCULAR | Status: AC
  Filled 2020-12-29: qty 30

## 2020-12-29 MED ORDER — PROPOFOL 10 MG/ML IV BOLUS
INTRAVENOUS | Status: AC
  Filled 2020-12-29: qty 20

## 2020-12-29 MED ORDER — FENTANYL CITRATE (PF) 100 MCG/2ML IJ SOLN: 50.0000 ug | Freq: Once | INTRAMUSCULAR | Status: AC

## 2020-12-29 MED ORDER — ACETAMINOPHEN 500 MG PO TABS
1000.0000 mg | ORAL_TABLET | ORAL | Status: AC
  Administered 2020-12-29: 1000 mg via ORAL
  Filled 2020-12-29: qty 2

## 2020-12-29 MED ORDER — SUFENTANIL CITRATE 50 MCG/ML IV SOLN
INTRAVENOUS | Status: AC
  Filled 2020-12-29: qty 1

## 2020-12-29 MED ORDER — FENTANYL CITRATE (PF) 100 MCG/2ML IJ SOLN
INTRAMUSCULAR | Status: AC
  Administered 2020-12-29: 50 ug via INTRAVENOUS
  Filled 2020-12-29: qty 2

## 2020-12-29 MED ORDER — MIDAZOLAM HCL 2 MG/2ML IJ SOLN
INTRAMUSCULAR | Status: AC
  Administered 2020-12-29: 2 mg via INTRAVENOUS
  Filled 2020-12-29: qty 2

## 2020-12-29 MED ORDER — DEXAMETHASONE SODIUM PHOSPHATE 10 MG/ML IJ SOLN: INTRAMUSCULAR | Status: DC | PRN

## 2020-12-29 MED ORDER — TECHNETIUM TC 99M TILMANOCEPT KIT
1.0000 | PACK | Freq: Once | INTRAVENOUS | Status: AC | PRN
  Administered 2020-12-29: 1 via INTRADERMAL

## 2020-12-29 MED ORDER — ENSURE PRE-SURGERY PO LIQD: 296.0000 mL | Freq: Once | ORAL | Status: DC

## 2020-12-29 MED ORDER — LACTATED RINGERS IV SOLN: INTRAVENOUS | Status: DC

## 2020-12-29 MED ORDER — 0.9 % SODIUM CHLORIDE (POUR BTL) OPTIME
TOPICAL | Status: DC | PRN
  Administered 2020-12-29: 1000 mL

## 2020-12-29 MED ORDER — PHENYLEPHRINE HCL (PRESSORS) 10 MG/ML IV SOLN
INTRAVENOUS | Status: DC | PRN
  Administered 2020-12-29: 80 ug via INTRAVENOUS

## 2020-12-29 MED ORDER — FENTANYL CITRATE (PF) 250 MCG/5ML IJ SOLN
INTRAMUSCULAR | Status: DC | PRN
  Administered 2020-12-29 (×2): 50 ug via INTRAVENOUS

## 2020-12-29 MED ORDER — BUPIVACAINE-EPINEPHRINE (PF) 0.25% -1:200000 IJ SOLN
INTRAMUSCULAR | Status: AC
  Filled 2020-12-29: qty 30

## 2020-12-29 MED ORDER — DEXAMETHASONE SODIUM PHOSPHATE 10 MG/ML IJ SOLN
INTRAMUSCULAR | Status: DC | PRN
  Administered 2020-12-29: 5 mg via INTRAVENOUS

## 2020-12-29 MED ORDER — KETOROLAC TROMETHAMINE 15 MG/ML IJ SOLN
INTRAMUSCULAR | Status: DC | PRN
  Administered 2020-12-29: 15 mg via INTRAVENOUS

## 2020-12-29 MED ORDER — EPHEDRINE SULFATE 50 MG/ML IJ SOLN
INTRAMUSCULAR | Status: DC | PRN
  Administered 2020-12-29 (×6): 10 mg via INTRAVENOUS

## 2020-12-29 MED ORDER — ONDANSETRON HCL 4 MG/2ML IJ SOLN
INTRAMUSCULAR | Status: DC | PRN
  Administered 2020-12-29: 4 mg via INTRAVENOUS

## 2020-12-29 MED ORDER — METHYLENE BLUE 0.5 % INJ SOLN
INTRAVENOUS | Status: AC
  Filled 2020-12-29: qty 10

## 2020-12-29 SURGICAL SUPPLY — 49 items
ADH SKN CLS APL DERMABOND .7 (GAUZE/BANDAGES/DRESSINGS) ×4
APL PRP STRL LF DISP 70% ISPRP (MISCELLANEOUS) ×2
BAG COUNTER SPONGE SURGICOUNT (BAG) ×3 IMPLANT
BAG SPNG CNTER NS LX DISP (BAG) ×2
BINDER BREAST LRG (GAUZE/BANDAGES/DRESSINGS) IMPLANT
BINDER BREAST XLRG (GAUZE/BANDAGES/DRESSINGS) ×3 IMPLANT
BNDG COHESIVE 4X5 TAN STRL (GAUZE/BANDAGES/DRESSINGS) ×3 IMPLANT
CANISTER SUCT 3000ML PPV (MISCELLANEOUS) ×3 IMPLANT
CHLORAPREP W/TINT 26 (MISCELLANEOUS) ×3 IMPLANT
CLIP VESOCCLUDE LG 6/CT (CLIP) ×3 IMPLANT
CLIP VESOCCLUDE MED 24/CT (CLIP) ×3 IMPLANT
CLSR STERI-STRIP ANTIMIC 1/2X4 (GAUZE/BANDAGES/DRESSINGS) ×3 IMPLANT
CNTNR URN SCR LID CUP LEK RST (MISCELLANEOUS) IMPLANT
CONT SPEC 4OZ STRL OR WHT (MISCELLANEOUS)
COVER PROBE W GEL 5X96 (DRAPES) ×3 IMPLANT
COVER SURGICAL LIGHT HANDLE (MISCELLANEOUS) ×3 IMPLANT
DERMABOND ADVANCED (GAUZE/BANDAGES/DRESSINGS) ×2
DERMABOND ADVANCED .7 DNX12 (GAUZE/BANDAGES/DRESSINGS) ×4 IMPLANT
DEVICE DUBIN SPECIMEN MAMMOGRA (MISCELLANEOUS) ×3 IMPLANT
DRAPE CHEST BREAST 15X10 FENES (DRAPES) ×3 IMPLANT
DRAPE SURG 17X23 STRL (DRAPES) IMPLANT
ELECT COATED BLADE 2.86 ST (ELECTRODE) ×3 IMPLANT
ELECT NEEDLE BLADE 2-5/6 (NEEDLE) ×3 IMPLANT
ELECT REM PT RETURN 9FT ADLT (ELECTROSURGICAL) ×3
ELECTRODE REM PT RTRN 9FT ADLT (ELECTROSURGICAL) ×2 IMPLANT
GAUZE SPONGE 4X4 12PLY STRL (GAUZE/BANDAGES/DRESSINGS) ×3 IMPLANT
GAUZE SPONGE 4X4 12PLY STRL LF (GAUZE/BANDAGES/DRESSINGS) ×3 IMPLANT
GLOVE SURG ENC MOIS LTX SZ6 (GLOVE) ×3 IMPLANT
GLOVE SURG UNDER LTX SZ6.5 (GLOVE) ×3 IMPLANT
GOWN STRL REUS W/ TWL LRG LVL3 (GOWN DISPOSABLE) ×2 IMPLANT
GOWN STRL REUS W/TWL 2XL LVL3 (GOWN DISPOSABLE) ×3 IMPLANT
GOWN STRL REUS W/TWL LRG LVL3 (GOWN DISPOSABLE) ×3
KIT BASIN OR (CUSTOM PROCEDURE TRAY) ×3 IMPLANT
KIT MARKER MARGIN INK (KITS) ×3 IMPLANT
LIGHT WAVEGUIDE WIDE FLAT (MISCELLANEOUS) IMPLANT
NEEDLE 18GX1X1/2 (RX/OR ONLY) (NEEDLE) IMPLANT
NEEDLE FILTER BLUNT 18X 1/2SAF (NEEDLE)
NEEDLE FILTER BLUNT 18X1 1/2 (NEEDLE) IMPLANT
NEEDLE HYPO 25GX1X1/2 BEV (NEEDLE) ×3 IMPLANT
NS IRRIG 1000ML POUR BTL (IV SOLUTION) ×3 IMPLANT
PACK GENERAL/GYN (CUSTOM PROCEDURE TRAY) ×3 IMPLANT
PACK UNIVERSAL I (CUSTOM PROCEDURE TRAY) ×3 IMPLANT
PAD ABD 7.5X8 STRL (GAUZE/BANDAGES/DRESSINGS) ×6 IMPLANT
STOCKINETTE IMPERVIOUS 9X36 MD (GAUZE/BANDAGES/DRESSINGS) ×3 IMPLANT
SUT MNCRL AB 4-0 PS2 18 (SUTURE) ×3 IMPLANT
SUT VIC AB 3-0 SH 8-18 (SUTURE) ×3 IMPLANT
SYR CONTROL 10ML LL (SYRINGE) ×3 IMPLANT
TOWEL GREEN STERILE (TOWEL DISPOSABLE) ×3 IMPLANT
TOWEL GREEN STERILE FF (TOWEL DISPOSABLE) ×3 IMPLANT

## 2020-12-29 NOTE — Transfer of Care (Signed)
Immediate Anesthesia Transfer of Care Note  Patient: Katrina Williamson  Procedure(s) Performed: LEFT BREAST LUMPECTOMY WITH RADIOACTIVE SEED (Left: Breast) LEFT AXILLARY SENTINEL NODE BIOPSY (Left: Axilla)  Patient Location: PACU  Anesthesia Type:General  Level of Consciousness: awake, alert  and oriented  Airway & Oxygen Therapy: Patient Spontanous Breathing and Patient connected to nasal cannula oxygen  Post-op Assessment: Report given to RN and Post -op Vital signs reviewed and stable  Post vital signs: Reviewed and stable  Last Vitals:  Vitals Value Taken Time  BP 115/60 12/29/20 1443  Temp    Pulse 78 12/29/20 1446  Resp 17 12/29/20 1447  SpO2 100 % 12/29/20 1446  Vitals shown include unvalidated device data.  Last Pain:  Vitals:   12/29/20 1003  TempSrc:   PainSc: 0-No pain         Complications: No notable events documented.

## 2020-12-29 NOTE — Anesthesia Postprocedure Evaluation (Signed)
Anesthesia Post Note  Patient: Katrina Williamson  Procedure(s) Performed: LEFT BREAST LUMPECTOMY WITH RADIOACTIVE SEED (Left: Breast) LEFT AXILLARY SENTINEL NODE BIOPSY (Left: Axilla)     Patient location during evaluation: PACU Anesthesia Type: General Level of consciousness: awake and alert Pain management: pain level controlled Vital Signs Assessment: post-procedure vital signs reviewed and stable Respiratory status: spontaneous breathing, nonlabored ventilation, respiratory function stable and patient connected to nasal cannula oxygen Cardiovascular status: blood pressure returned to baseline and stable Postop Assessment: no apparent nausea or vomiting Anesthetic complications: no   No notable events documented.  Last Vitals:  Vitals:   12/29/20 1500 12/29/20 1515  BP: 113/60 122/65  Pulse: 74 70  Resp: 17 10  Temp:  (!) 36.1 C  SpO2: 100% 100%    Last Pain:  Vitals:   12/29/20 1515  TempSrc:   PainSc: 0-No pain                 Effie Berkshire

## 2020-12-29 NOTE — Anesthesia Preprocedure Evaluation (Addendum)
Anesthesia Evaluation  Patient identified by MRN, date of birth, ID band Patient awake    Reviewed: Allergy & Precautions, NPO status , Patient's Chart, lab work & pertinent test results  Airway Mallampati: II  TM Distance: >3 FB Neck ROM: Full    Dental  (+) Teeth Intact, Dental Advisory Given   Pulmonary asthma ,    breath sounds clear to auscultation       Cardiovascular negative cardio ROS   Rhythm:Regular Rate:Normal     Neuro/Psych  Headaches, PSYCHIATRIC DISORDERS Anxiety    GI/Hepatic negative GI ROS, Neg liver ROS,   Endo/Other  negative endocrine ROS  Renal/GU negative Renal ROS     Musculoskeletal negative musculoskeletal ROS (+)   Abdominal Normal abdominal exam  (+)   Peds  Hematology negative hematology ROS (+)   Anesthesia Other Findings   Reproductive/Obstetrics                            Anesthesia Physical Anesthesia Plan  ASA: 2  Anesthesia Plan: General   Post-op Pain Management: GA combined w/ Regional for post-op pain   Induction: Intravenous  PONV Risk Score and Plan: 4 or greater and Ondansetron, Dexamethasone, Midazolam and Scopolamine patch - Pre-op  Airway Management Planned: LMA  Additional Equipment: None  Intra-op Plan:   Post-operative Plan: Extubation in OR  Informed Consent: I have reviewed the patients History and Physical, chart, labs and discussed the procedure including the risks, benefits and alternatives for the proposed anesthesia with the patient or authorized representative who has indicated his/her understanding and acceptance.     Dental advisory given  Plan Discussed with: CRNA  Anesthesia Plan Comments:        Anesthesia Quick Evaluation

## 2020-12-29 NOTE — Interval H&P Note (Signed)
History and Physical Interval Note:  12/29/2020 12:26 PM  Katrina Williamson  has presented today for surgery, with the diagnosis of LEFT BREAST CANCER.  The various methods of treatment have been discussed with the patient and family. After consideration of risks, benefits and other options for treatment, the patient has consented to  Procedure(s): LEFT BREAST LUMPECTOMY WITH RADIOACTIVE SEED AND SENTINEL LYMPH NODE BIOPSY (Left) as a surgical intervention.  The patient's history has been reviewed, patient examined, no change in status, stable for surgery.  I have reviewed the patient's chart and labs.  Questions were answered to the patient's satisfaction.     Stark Klein

## 2020-12-29 NOTE — Anesthesia Procedure Notes (Signed)
Anesthesia Regional Block: Pectoralis block   Pre-Anesthetic Checklist: , timeout performed,  Correct Patient, Correct Site, Correct Laterality,  Correct Procedure, Correct Position, site marked,  Risks and benefits discussed,  Surgical consent,  Pre-op evaluation,  At surgeon's request and post-op pain management  Laterality: Left  Prep: chloraprep       Needles:  Injection technique: Single-shot  Needle Type: Echogenic Stimulator Needle     Needle Length: 9cm  Needle Gauge: 21     Additional Needles:   Procedures:,,,, ultrasound used (permanent image in chart),,    Narrative:  Start time: 12/29/2020 11:00 AM End time: 12/29/2020 11:05 AM Injection made incrementally with aspirations every 5 mL.  Performed by: Personally  Anesthesiologist: Effie Berkshire, MD  Additional Notes: Patient tolerated the procedure well. Local anesthetic introduced in an incremental fashion under minimal resistance after negative aspirations. No paresthesias were elicited. After completion of the procedure, no acute issues were identified and patient continued to be monitored by RN.

## 2020-12-29 NOTE — Discharge Instructions (Addendum)
[facility] Office Phone Number [phone]  BREAST BIOPSY/ PARTIAL MASTECTOMY: POST OP INSTRUCTIONS  Always review your discharge instruction sheet given to you by the facility where your surgery was performed.  IF YOU HAVE DISABILITY OR FAMILY LEAVE FORMS, YOU MUST BRING THEM TO THE OFFICE FOR PROCESSING.  DO NOT GIVE THEM TO YOUR DOCTOR.  A prescription for pain medication may be given to you upon discharge.  Take your pain medication as prescribed, if needed.  If narcotic pain medicine is not needed, then you may take acetaminophen (Tylenol) or ibuprofen (Advil) as needed. Take your usually prescribed medications unless otherwise directed If you need a refill on your pain medication, please contact your pharmacy.  They will contact our office to request authorization.  Prescriptions will not be filled after 5pm or on week-ends. You should eat very light the first 24 hours after surgery, such as soup, crackers, pudding, etc.  Resume your normal diet the day after surgery. Most patients will experience some swelling and bruising in the breast.  Ice packs and a good support bra will help.  Swelling and bruising can take several days to resolve.  It is common to experience some constipation if taking pain medication after surgery.  Increasing fluid intake and taking a stool softener will usually help or prevent this problem from occurring.  A mild laxative (Milk of Magnesia or Miralax) should be taken according to package directions if there are no bowel movements after 48 hours. Unless discharge instructions indicate otherwise, you may remove your bandages 48 hours after surgery, and you may shower at that time.  You may have steri-strips (small skin tapes) in place directly over the incision.  These strips should be left on the skin for 7-10 days.   Any sutures or staples will be removed at the office during your follow-up visit. ACTIVITIES:  You may resume regular daily activities  (gradually increasing) beginning the next day.  Wearing a good support bra or sports bra (or the breast binder) minimizes pain and swelling.  You may have sexual intercourse when it is comfortable. You may drive when you no longer are taking prescription pain medication, you can comfortably wear a seatbelt, and you can safely maneuver your car and apply brakes. RETURN TO WORK:  __________1 week_______________ You should see your doctor in the office for a follow-up appointment approximately two weeks after your surgery.  Your doctor's nurse will typically make your follow-up appointment when she calls you with your pathology report.  Expect your pathology report 2-3 business days after your surgery.  You may call to check if you do not hear from us after three days.   WHEN TO CALL YOUR DOCTOR: Fever over 101.0 Nausea and/or vomiting. Extreme swelling or bruising. Continued bleeding from incision. Increased pain, redness, or drainage from the incision.  The clinic staff is available to answer your questions during regular business hours.  Please don't hesitate to call and ask to speak to one of the nurses for clinical concerns.  If you have a medical emergency, go to the nearest emergency room or call 911.  A surgeon from [facility] is always on call at the hospital.  For further questions, please visit [email or web address]   

## 2020-12-29 NOTE — Op Note (Signed)
Left Breast Radioactive seed localized lumpectomy and sentinel lymph node biopsy  Indications: This patient presents with history of left breast cancer, grade 2 invasive lobular carcinoma, LOQ, cT1bN0, +/+/-  Pre-operative Diagnosis: left breast cancer  Post-operative Diagnosis: Same  Surgeon: Stark Klein   Assistant: Carlena Hurl, PA-C  Anesthesia: General endotracheal anesthesia  ASA Class: 2  Procedure Details  The patient was seen in the Holding Room. The risks, benefits, complications, treatment options, and expected outcomes were discussed with the patient. The possibilities of bleeding, infection, the need for additional procedures, failure to diagnose a condition, and creating a complication requiring transfusion or operation were discussed with the patient. The patient concurred with the proposed plan, giving informed consent.  The site of surgery properly noted/marked. The patient was taken to Operating Room # 2, identified, and the procedure verified as left Breast Seed localized Lumpectomy with sentinel lymph node biopsy. A Time Out was held and the above information confirmed.  The left arm, breast, and chest were prepped and draped in standard fashion. The lumpectomy was performed by creating a transverse lateral incision near the previously placed radioactive seed.  Dissection was carried down to around the point of maximum signal intensity. The cautery was used to perform the dissection.  Hemostasis was achieved with cautery. The specimen was inked with the margin marker paint kit.    Specimen radiography confirmed inclusion of the mammographic lesion, the clip, and the seed.  The background signal in the breast was zero. The edges of the cavity were marked with large clips, with one each medial, lateral, inferior and superior, and two clips posteriorly.   Several deeper 3-0 sutures were placed to mitigate the defect.   The wound was irrigated and closed with 3-0 vicryl in layers  and 4-0 monocryl subcuticular suture.    Using a hand-held gamma probe, left axillary sentinel nodes were identified transcutaneously.  An oblique incision was created below the axillary hairline.  Dissection was carried through the clavipectoral fascia.  Two deep level 2 axillary sentinel nodes were removed.  Counts per second were 297 and 295.    The background count was 28 cps.  The wound was irrigated.  Hemostasis was achieved with cautery.  The axillary incision was closed with a 3-0 vicryl deep dermal interrupted sutures and a 4-0 monocryl subcuticular closure.    Sterile dressings were applied. At the end of the operation, all sponge, instrument, and needle counts were correct.  Findings: grossly clear surgical margins and no adenopathy, anterior margins is skin. Posterior margin is pectoralis  Estimated Blood Loss:  min         Specimens: left breast tissue with seed and two left axillary sentinel lymph nodes.             Complications:  None; patient tolerated the procedure well.         Disposition: PACU - hemodynamically stable.         Condition: stable

## 2020-12-30 ENCOUNTER — Encounter (HOSPITAL_COMMUNITY): Payer: Self-pay | Admitting: General Surgery

## 2020-12-30 ENCOUNTER — Emergency Department (HOSPITAL_COMMUNITY)
Admission: EM | Admit: 2020-12-30 | Discharge: 2020-12-30 | Disposition: A | Discharge status: Home / Self Care | Payer: Medicare Other | Attending: Emergency Medicine | Admitting: Emergency Medicine

## 2020-12-30 DIAGNOSIS — L821 Other seborrheic keratosis: Secondary | ICD-10-CM | POA: Insufficient documentation

## 2020-12-30 DIAGNOSIS — Z48817 Encounter for surgical aftercare following surgery on the skin and subcutaneous tissue: Secondary | ICD-10-CM | POA: Diagnosis not present

## 2020-12-30 DIAGNOSIS — Z5321 Procedure and treatment not carried out due to patient leaving prior to being seen by health care provider: Secondary | ICD-10-CM | POA: Insufficient documentation

## 2020-12-30 NOTE — ED Notes (Addendum)
Pt left & handed this tech her patient labels. Stated "I am going to go to the breast center instead. I just got a call from my doctor."

## 2021-01-03 LAB — SURGICAL PATHOLOGY

## 2021-01-05 ENCOUNTER — Encounter: Payer: Self-pay | Admitting: *Deleted

## 2021-01-05 ENCOUNTER — Telehealth: Payer: Self-pay | Admitting: *Deleted

## 2021-01-05 NOTE — Telephone Encounter (Signed)
Ordered oncotype per Dr.Magrinat.  Faxed requisition to pathology and [facility].

## 2021-01-13 DIAGNOSIS — Z170 Estrogen receptor positive status [ER+]: Secondary | ICD-10-CM | POA: Diagnosis not present

## 2021-01-13 DIAGNOSIS — C50512 Malignant neoplasm of lower-outer quadrant of left female breast: Secondary | ICD-10-CM | POA: Diagnosis not present

## 2021-01-17 ENCOUNTER — Encounter: Payer: Self-pay | Admitting: *Deleted

## 2021-01-17 ENCOUNTER — Telehealth: Payer: Self-pay | Admitting: *Deleted

## 2021-01-17 NOTE — Telephone Encounter (Signed)
Received oncotype score of 22. Physician team notified. Called pt, left vm with results and chemo not recommended. Stated next step will be xrt after released by Dr. Barry Dienes. Contact information provided for questions or needs.

## 2021-01-19 ENCOUNTER — Encounter: Payer: Self-pay | Admitting: Oncology

## 2021-02-02 ENCOUNTER — Encounter: Payer: Self-pay | Admitting: *Deleted

## 2021-02-03 ENCOUNTER — Other Ambulatory Visit: Payer: Self-pay | Admitting: *Deleted

## 2021-02-03 DIAGNOSIS — Z170 Estrogen receptor positive status [ER+]: Secondary | ICD-10-CM

## 2021-02-03 DIAGNOSIS — C50512 Malignant neoplasm of lower-outer quadrant of left female breast: Secondary | ICD-10-CM

## 2021-02-10 ENCOUNTER — Encounter: Payer: Self-pay | Admitting: *Deleted

## 2021-02-10 ENCOUNTER — Ambulatory Visit
Admission: RE | Admit: 2021-02-10 | Discharge: 2021-02-10 | Disposition: A | Discharge status: Home / Self Care | Payer: Medicare Other | Source: Ambulatory Visit | Attending: Radiation Oncology | Admitting: Radiation Oncology

## 2021-02-10 ENCOUNTER — Encounter: Payer: Self-pay | Admitting: Radiation Oncology

## 2021-02-10 ENCOUNTER — Other Ambulatory Visit: Payer: Self-pay

## 2021-02-10 VITALS — BP 105/59 | HR 71 | Temp 97.6°F | Resp 18 | Ht 64.0 in | Wt 139.4 lb

## 2021-02-10 DIAGNOSIS — Z809 Family history of malignant neoplasm, unspecified: Secondary | ICD-10-CM | POA: Diagnosis not present

## 2021-02-10 DIAGNOSIS — Z791 Long term (current) use of non-steroidal anti-inflammatories (NSAID): Secondary | ICD-10-CM | POA: Insufficient documentation

## 2021-02-10 DIAGNOSIS — Z170 Estrogen receptor positive status [ER+]: Secondary | ICD-10-CM | POA: Diagnosis not present

## 2021-02-10 DIAGNOSIS — M8580 Other specified disorders of bone density and structure, unspecified site: Secondary | ICD-10-CM | POA: Insufficient documentation

## 2021-02-10 DIAGNOSIS — C50512 Malignant neoplasm of lower-outer quadrant of left female breast: Secondary | ICD-10-CM | POA: Diagnosis not present

## 2021-02-10 DIAGNOSIS — Z79899 Other long term (current) drug therapy: Secondary | ICD-10-CM | POA: Diagnosis not present

## 2021-02-10 NOTE — Progress Notes (Signed)
Patient reports mild fatigue, swelling of lt axilla (lump), w/ tenderness, and full range of motion bilaterally.  Meaningful use complete.  BP (!) 105/59 (BP Location: Right Arm, Patient Position: Sitting, Cuff Size: Normal)   Pulse 71   Temp 97.6 F (36.4 C) (Oral)   Resp 18   Ht '5\' 4"'$  (1.626 m)   Wt 139 lb 6.4 oz (63.2 kg)   SpO2 97%   BMI 23.93 kg/m

## 2021-02-11 NOTE — Progress Notes (Signed)
Radiation Oncology         [phone]) [phone] ________________________________  Name: Katrina Williamson        MRN: [id number]  Date of Service: 02/10/2021 DOB: [birth year]  CC:Leamon Arnt, MD  Magrinat, Virgie Dad, MD     REFERRING PHYSICIAN: Magrinat, Virgie Dad, MD   DIAGNOSIS: The encounter diagnosis was Malignant neoplasm of lower-outer quadrant of left breast of female, estrogen receptor positive ([place]).   HISTORY OF PRESENT ILLNESS: Katrina Williamson is a 65 y.o. female seen with a history of left breast cancer. The patient was originally seen in multidisciplinary breast oncology clinic in July.  She had been found to have a screening detected abnormality in the left breast.  Ultrasound identified a 0.9 cm tumor the 3:30 position within the left breast.  No axillary adenopathy was seen on this side.  Biopsy was performed and this revealed a grade 2 invasive lobular carcinoma.  Receptor studies indicated that the tumor is estrogen receptor positive, progesterone receptor positive, and HER2/neu negative.  Ki-67 staining was 1%.   Since her last visit she has undergone left lumpectomy and sentinel lymph node biopsy on 12/29/2020 and this revealed a grade 2 invasive lobular carcinoma spanning 1.4 cm with atypical lobular neoplasia the invasive component was less than 1 mm from the posterior margin focally in 1 to 2 mm from the anterior margin focally 2 sentinel lymph nodes were removed and were negative for metastatic disease.  Oncotype scoring was also performed and was 22 so no systemic chemotherapy has been recommended.  She is seen today to discuss adjuvant radiotherapy.    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  See old records   Postmenopausal HRT (hormone replacement therapy) 03/14/2018   Menopause at age 40;        PAST SURGICAL HISTORY: Past Surgical History:  Procedure Laterality Date   BREAST LUMPECTOMY WITH RADIOACTIVE SEED AND SENTINEL LYMPH NODE BIOPSY Left 12/29/2020   Procedure: LEFT BREAST LUMPECTOMY WITH RADIOACTIVE SEED;  Surgeon: Stark Klein, MD;  Location: [place];  Service: General;  Laterality: Left;   SENTINEL NODE BIOPSY Left 12/29/2020   Procedure: LEFT AXILLARY SENTINEL NODE BIOPSY;  Surgeon: Stark Klein, MD;  Location: [place];  Service: General;  Laterality: Left;     FAMILY HISTORY:  Family History  Problem Relation Age of Onset   Arthritis Mother    Hearing loss Mother    Stroke Father    Alcohol abuse Sister    Arthritis Sister    Cancer Sister      SOCIAL HISTORY:  reports that she has never smoked. She has never used smokeless tobacco. She reports current alcohol use of about 2.0 standard drinks per week. She reports that she does not currently use drugs after having used the following drugs: Marijuana.  The patient is in a relationship with [name] who accompanies her today.  She lives in [place].  She is retired from working in Sales promotion account executive and used to live in [place].   ALLERGIES: Patient has no known allergies.   MEDICATIONS:  Current Outpatient Medications  Medication Sig Dispense Refill   ibuprofen (ADVIL) 200 MG tablet Take 400 mg by mouth every 8 (eight) hours as needed (PAIN.).     sertraline (ZOLOFT) 50 MG tablet TAKE 1 TABLET BY MOUTH EVERY DAY (Patient taking differently: Take 50 mg by mouth at bedtime.) 90 tablet 3   acetaminophen (TYLENOL) 500 MG tablet Take 1,000 mg by mouth every 8 (eight) hours as needed (PAIN).     albuterol (VENTOLIN HFA) 108 (90 Base) MCG/ACT inhaler Inhale 2 puffs into the lungs every 4 (four) hours as needed for wheezing or shortness of breath. 1 each  2   aspirin-acetaminophen-caffeine (EXCEDRIN MIGRAINE) 250-250-65 MG tablet Take 2 tablets by mouth every 6 (six) hours as needed for headache.     Calcium Citrate-Vitamin D (CITRACAL PETITES/VITAMIN D) 200-250 MG-UNIT TABS Take 3 tabs twice a day (Patient not taking: Reported on 02/10/2021) 60 tablet    cholecalciferol (VITAMIN D) 25 MCG (1000 UNIT) tablet Take 1,000 Units by mouth at bedtime. (Patient not taking: Reported on 02/10/2021)     ciclopirox (PENLAC) 8 % solution Apply 1 application topically See admin instructions. APPLY 1 COAT AFTER SHOWERING (Patient not taking: Reported on 02/10/2021)     oxyCODONE (OXY IR/ROXICODONE) 5 MG immediate release tablet Take 1 tablet (5 mg total) by mouth every 6 (six) hours as needed for severe pain. (Patient not taking: Reported on 02/10/2021) 8 tablet 0   No current facility-administered medications for this encounter.     REVIEW OF SYSTEMS: On review of systems, the patient reports that she is doing well.  She is not sure if she is having some swelling that is related to fluid or just postoperative expected findings.  She feels like she has been able to do her normal routine however.  No other complaints are verbalized.     PHYSICAL EXAM:  Wt Readings from Last 3 Encounters:  02/10/21 139 lb 6.4 oz (63.2 kg)  12/29/20 135 lb (61.2 kg)  12/21/20 134 lb (60.8 kg)   Temp Readings from Last 3 Encounters:  02/10/21 97.6 F (36.4 C) (Oral)  12/30/20 98 F (36.7 C) (Oral)  12/29/20 (!) 97 F (36.1 C)   BP Readings from Last 3 Encounters:  02/10/21 (!) 105/59  12/30/20 110/61  12/29/20 122/65   Pulse Readings from Last 3 Encounters:  02/10/21 71  12/30/20 64  12/29/20 70    In general this is a well appearing Caucasian female in no acute distress. She's alert and oriented x4 and appropriate throughout the examination. Cardiopulmonary assessment is negative for acute distress and she exhibits normal effort.  Her left breast and axillary incision  site are both well-healed without erythema cellulitic streaking bleeding or drainage.  Minimal induration is noted below each of the incisions.   ECOG = 0  0 - Asymptomatic (Fully active, able to carry on all predisease activities without restriction)  1 - Symptomatic but completely ambulatory (Restricted in physically strenuous activity but ambulatory and able to carry out work of a light or sedentary nature. For example, light housework, office work)  2 - Symptomatic, <50% in bed during the day (Ambulatory and capable of all self care but unable to carry out any work activities. Up and about more than 50% of waking hours)  3 - Symptomatic, >50% in bed, but not bedbound (Capable of only limited self-care, confined to bed or chair 50% or more of waking hours)  4 - Bedbound (Completely disabled. Cannot carry on  any self-care. Totally confined to bed or chair)  5 - Death   Eustace Pen MM, Creech RH, Tormey DC, et al. [phone]). "Toxicity and response criteria of the [facility]". [place] Oncol. 5 (6): 649-55    LABORATORY DATA:  Lab Results  Component Value Date   WBC 6.0 12/21/2020   HGB 13.4 12/21/2020   HCT 40.8 12/21/2020   MCV 92.5 12/21/2020   PLT 247 12/21/2020   Lab Results  Component Value Date   NA 138 12/21/2020   K 4.2 12/21/2020   CL 103 12/21/2020   CO2 30 12/21/2020   Lab Results  Component Value Date   ALT 25 12/21/2020   AST 24 12/21/2020   ALKPHOS 69 12/21/2020   BILITOT 0.8 12/21/2020      RADIOGRAPHY: No results found.     IMPRESSION/PLAN: 1. Stage  IA, pT1cN0M0 grade 2, ER/PR positive invasive lobular carcinoma of the left breast. Dr. Lisbeth Renshaw discusses the pathology findings and reviews the nature of left breast disease.  She is well-healed from surgery and fortunately does not need any systemic chemotherapy.  She is now ready to consider external radiotherapy to the breast  to reduce risks of local recurrence followed by  antiestrogen therapy. We discussed the risks, benefits, short, and long term effects of radiotherapy, as well as the curative intent, and the patient is interested in proceeding. Dr. Lisbeth Renshaw discusses the delivery and logistics of radiotherapy and anticipates a course of 4 weeks of radiotherapy to the left breast with deep inspiration breath-hold technique. Written consent is obtained and placed in the chart, a copy was provided to the patient.  Given that she is interested in starting treatment a few weeks from now because of a planned vacation we will move her simulation which was originally scheduled from next week to the following.  We anticipate her treatment would begin the week of March 07, 2021.     In a visit lasting 45 minutes, greater than 50% of the time was spent face to face reviewing her case, as well as in preparation of, discussing, and coordinating the patient's care.  The above documentation reflects my direct findings during this shared patient visit. Please see the separate note by Dr. Lisbeth Renshaw on this date for the remainder of the patient's plan of care.    Carola Rhine, [facility]    **Disclaimer: This note was dictated with voice recognition software. Similar sounding words can inadvertently be transcribed and this note may contain transcription errors which may not have been corrected upon publication of note.**

## 2021-02-16 ENCOUNTER — Ambulatory Visit: Payer: Medicare Other | Admitting: Radiation Oncology

## 2021-02-17 ENCOUNTER — Encounter: Payer: Self-pay | Admitting: *Deleted

## 2021-02-23 ENCOUNTER — Other Ambulatory Visit: Payer: Self-pay

## 2021-02-23 ENCOUNTER — Ambulatory Visit
Admission: RE | Admit: 2021-02-23 | Discharge: 2021-02-23 | Disposition: A | Discharge status: Home / Self Care | Payer: Medicare Other | Source: Ambulatory Visit | Attending: Radiation Oncology | Admitting: Radiation Oncology

## 2021-02-23 DIAGNOSIS — C50512 Malignant neoplasm of lower-outer quadrant of left female breast: Secondary | ICD-10-CM | POA: Insufficient documentation

## 2021-02-23 DIAGNOSIS — Z170 Estrogen receptor positive status [ER+]: Secondary | ICD-10-CM | POA: Diagnosis not present

## 2021-03-02 ENCOUNTER — Encounter: Payer: Self-pay | Admitting: *Deleted

## 2021-03-07 ENCOUNTER — Ambulatory Visit: Payer: Medicare Other | Admitting: Radiation Oncology

## 2021-03-07 ENCOUNTER — Telehealth: Payer: Self-pay | Admitting: Oncology

## 2021-03-07 DIAGNOSIS — C50512 Malignant neoplasm of lower-outer quadrant of left female breast: Secondary | ICD-10-CM | POA: Diagnosis not present

## 2021-03-07 DIAGNOSIS — Z170 Estrogen receptor positive status [ER+]: Secondary | ICD-10-CM | POA: Insufficient documentation

## 2021-03-07 NOTE — Telephone Encounter (Signed)
Rescheduled per 10/3 sch msg, pt was called and confirmed appt

## 2021-03-08 ENCOUNTER — Other Ambulatory Visit: Payer: Self-pay

## 2021-03-08 ENCOUNTER — Ambulatory Visit
Admission: RE | Admit: 2021-03-08 | Discharge: 2021-03-08 | Disposition: A | Discharge status: Home / Self Care | Payer: Medicare Other | Source: Ambulatory Visit | Attending: Radiation Oncology | Admitting: Radiation Oncology

## 2021-03-08 DIAGNOSIS — C50512 Malignant neoplasm of lower-outer quadrant of left female breast: Secondary | ICD-10-CM | POA: Diagnosis not present

## 2021-03-08 DIAGNOSIS — Z170 Estrogen receptor positive status [ER+]: Secondary | ICD-10-CM | POA: Diagnosis not present

## 2021-03-09 ENCOUNTER — Ambulatory Visit
Admission: RE | Admit: 2021-03-09 | Discharge: 2021-03-09 | Disposition: A | Discharge status: Home / Self Care | Payer: Medicare Other | Source: Ambulatory Visit | Attending: Radiation Oncology | Admitting: Radiation Oncology

## 2021-03-09 ENCOUNTER — Other Ambulatory Visit: Payer: Self-pay

## 2021-03-09 DIAGNOSIS — Z170 Estrogen receptor positive status [ER+]: Secondary | ICD-10-CM | POA: Diagnosis not present

## 2021-03-09 DIAGNOSIS — C50512 Malignant neoplasm of lower-outer quadrant of left female breast: Secondary | ICD-10-CM | POA: Diagnosis not present

## 2021-03-10 ENCOUNTER — Ambulatory Visit
Admission: RE | Admit: 2021-03-10 | Discharge: 2021-03-10 | Disposition: A | Discharge status: Home / Self Care | Payer: Medicare Other | Source: Ambulatory Visit | Attending: Radiation Oncology | Admitting: Radiation Oncology

## 2021-03-10 ENCOUNTER — Other Ambulatory Visit: Payer: Self-pay

## 2021-03-10 DIAGNOSIS — Z170 Estrogen receptor positive status [ER+]: Secondary | ICD-10-CM | POA: Diagnosis not present

## 2021-03-10 DIAGNOSIS — C50512 Malignant neoplasm of lower-outer quadrant of left female breast: Secondary | ICD-10-CM | POA: Diagnosis not present

## 2021-03-10 NOTE — Progress Notes (Signed)

## 2021-03-11 ENCOUNTER — Ambulatory Visit
Admission: RE | Admit: 2021-03-11 | Discharge: 2021-03-11 | Disposition: A | Discharge status: Home / Self Care | Payer: Medicare Other | Source: Ambulatory Visit | Attending: Radiation Oncology | Admitting: Radiation Oncology

## 2021-03-11 DIAGNOSIS — Z170 Estrogen receptor positive status [ER+]: Secondary | ICD-10-CM | POA: Diagnosis not present

## 2021-03-11 DIAGNOSIS — C50512 Malignant neoplasm of lower-outer quadrant of left female breast: Secondary | ICD-10-CM

## 2021-03-11 MED ORDER — ALRA NON-METALLIC DEODORANT (RAD-ONC)
1.0000 "application " | Freq: Once | TOPICAL | Status: AC
  Administered 2021-03-11: 1 via TOPICAL

## 2021-03-11 MED ORDER — RADIAPLEXRX EX GEL: Freq: Once | CUTANEOUS | Status: AC

## 2021-03-14 ENCOUNTER — Ambulatory Visit
Admission: RE | Admit: 2021-03-14 | Discharge: 2021-03-14 | Disposition: A | Discharge status: Home / Self Care | Payer: Medicare Other | Source: Ambulatory Visit | Attending: Radiation Oncology | Admitting: Radiation Oncology

## 2021-03-14 ENCOUNTER — Other Ambulatory Visit: Payer: Self-pay

## 2021-03-14 DIAGNOSIS — C50512 Malignant neoplasm of lower-outer quadrant of left female breast: Secondary | ICD-10-CM | POA: Diagnosis not present

## 2021-03-14 DIAGNOSIS — Z170 Estrogen receptor positive status [ER+]: Secondary | ICD-10-CM | POA: Diagnosis not present

## 2021-03-15 ENCOUNTER — Other Ambulatory Visit: Payer: PPO

## 2021-03-15 ENCOUNTER — Ambulatory Visit
Admission: RE | Admit: 2021-03-15 | Discharge: 2021-03-15 | Disposition: A | Discharge status: Home / Self Care | Payer: Medicare Other | Source: Ambulatory Visit | Attending: Radiation Oncology | Admitting: Radiation Oncology

## 2021-03-15 ENCOUNTER — Ambulatory Visit: Payer: PPO | Admitting: Oncology

## 2021-03-15 DIAGNOSIS — C50512 Malignant neoplasm of lower-outer quadrant of left female breast: Secondary | ICD-10-CM | POA: Diagnosis not present

## 2021-03-15 DIAGNOSIS — Z170 Estrogen receptor positive status [ER+]: Secondary | ICD-10-CM | POA: Diagnosis not present

## 2021-03-16 ENCOUNTER — Other Ambulatory Visit: Payer: Self-pay

## 2021-03-16 ENCOUNTER — Ambulatory Visit
Admission: RE | Admit: 2021-03-16 | Discharge: 2021-03-16 | Disposition: A | Discharge status: Home / Self Care | Payer: Medicare Other | Source: Ambulatory Visit | Attending: Radiation Oncology | Admitting: Radiation Oncology

## 2021-03-16 DIAGNOSIS — Z170 Estrogen receptor positive status [ER+]: Secondary | ICD-10-CM | POA: Diagnosis not present

## 2021-03-16 DIAGNOSIS — C50512 Malignant neoplasm of lower-outer quadrant of left female breast: Secondary | ICD-10-CM | POA: Diagnosis not present

## 2021-03-17 ENCOUNTER — Ambulatory Visit
Admission: RE | Admit: 2021-03-17 | Discharge: 2021-03-17 | Disposition: A | Discharge status: Home / Self Care | Payer: Medicare Other | Source: Ambulatory Visit | Attending: Radiation Oncology | Admitting: Radiation Oncology

## 2021-03-17 DIAGNOSIS — C50512 Malignant neoplasm of lower-outer quadrant of left female breast: Secondary | ICD-10-CM | POA: Diagnosis not present

## 2021-03-17 DIAGNOSIS — Z170 Estrogen receptor positive status [ER+]: Secondary | ICD-10-CM | POA: Diagnosis not present

## 2021-03-18 ENCOUNTER — Ambulatory Visit
Admission: RE | Admit: 2021-03-18 | Discharge: 2021-03-18 | Disposition: A | Discharge status: Home / Self Care | Payer: Medicare Other | Source: Ambulatory Visit | Attending: Radiation Oncology | Admitting: Radiation Oncology

## 2021-03-18 ENCOUNTER — Other Ambulatory Visit: Payer: Self-pay

## 2021-03-18 DIAGNOSIS — C50512 Malignant neoplasm of lower-outer quadrant of left female breast: Secondary | ICD-10-CM | POA: Diagnosis not present

## 2021-03-18 DIAGNOSIS — Z170 Estrogen receptor positive status [ER+]: Secondary | ICD-10-CM | POA: Diagnosis not present

## 2021-03-21 ENCOUNTER — Ambulatory Visit
Admission: RE | Admit: 2021-03-21 | Discharge: 2021-03-21 | Disposition: A | Discharge status: Home / Self Care | Payer: Medicare Other | Source: Ambulatory Visit | Attending: Radiation Oncology | Admitting: Radiation Oncology

## 2021-03-21 ENCOUNTER — Other Ambulatory Visit: Payer: Self-pay

## 2021-03-21 DIAGNOSIS — C50512 Malignant neoplasm of lower-outer quadrant of left female breast: Secondary | ICD-10-CM | POA: Diagnosis not present

## 2021-03-21 DIAGNOSIS — Z170 Estrogen receptor positive status [ER+]: Secondary | ICD-10-CM | POA: Diagnosis not present

## 2021-03-22 ENCOUNTER — Ambulatory Visit
Admission: RE | Admit: 2021-03-22 | Discharge: 2021-03-22 | Disposition: A | Discharge status: Home / Self Care | Payer: Medicare Other | Source: Ambulatory Visit | Attending: Radiation Oncology | Admitting: Radiation Oncology

## 2021-03-22 DIAGNOSIS — C50512 Malignant neoplasm of lower-outer quadrant of left female breast: Secondary | ICD-10-CM | POA: Diagnosis not present

## 2021-03-22 DIAGNOSIS — Z170 Estrogen receptor positive status [ER+]: Secondary | ICD-10-CM | POA: Diagnosis not present

## 2021-03-23 ENCOUNTER — Ambulatory Visit
Admission: RE | Admit: 2021-03-23 | Discharge: 2021-03-23 | Disposition: A | Discharge status: Home / Self Care | Payer: Medicare Other | Source: Ambulatory Visit | Attending: Radiation Oncology | Admitting: Radiation Oncology

## 2021-03-23 ENCOUNTER — Other Ambulatory Visit: Payer: Self-pay

## 2021-03-23 DIAGNOSIS — C50512 Malignant neoplasm of lower-outer quadrant of left female breast: Secondary | ICD-10-CM | POA: Diagnosis not present

## 2021-03-23 DIAGNOSIS — Z170 Estrogen receptor positive status [ER+]: Secondary | ICD-10-CM | POA: Diagnosis not present

## 2021-03-24 ENCOUNTER — Ambulatory Visit
Admission: RE | Admit: 2021-03-24 | Discharge: 2021-03-24 | Disposition: A | Discharge status: Home / Self Care | Payer: Medicare Other | Source: Ambulatory Visit | Attending: Radiation Oncology | Admitting: Radiation Oncology

## 2021-03-24 DIAGNOSIS — C50512 Malignant neoplasm of lower-outer quadrant of left female breast: Secondary | ICD-10-CM | POA: Diagnosis not present

## 2021-03-24 DIAGNOSIS — Z170 Estrogen receptor positive status [ER+]: Secondary | ICD-10-CM | POA: Diagnosis not present

## 2021-03-25 ENCOUNTER — Other Ambulatory Visit: Payer: Self-pay

## 2021-03-25 ENCOUNTER — Ambulatory Visit: Payer: Medicare Other | Admitting: Radiation Oncology

## 2021-03-25 ENCOUNTER — Ambulatory Visit
Admission: RE | Admit: 2021-03-25 | Discharge: 2021-03-25 | Disposition: A | Discharge status: Home / Self Care | Payer: Medicare Other | Source: Ambulatory Visit | Attending: Radiation Oncology | Admitting: Radiation Oncology

## 2021-03-25 DIAGNOSIS — Z170 Estrogen receptor positive status [ER+]: Secondary | ICD-10-CM | POA: Diagnosis not present

## 2021-03-25 DIAGNOSIS — C50512 Malignant neoplasm of lower-outer quadrant of left female breast: Secondary | ICD-10-CM | POA: Diagnosis not present

## 2021-03-28 ENCOUNTER — Other Ambulatory Visit: Payer: Self-pay

## 2021-03-28 ENCOUNTER — Ambulatory Visit
Admission: RE | Admit: 2021-03-28 | Discharge: 2021-03-28 | Disposition: A | Discharge status: Home / Self Care | Payer: Medicare Other | Source: Ambulatory Visit | Attending: Family Medicine | Admitting: Family Medicine

## 2021-03-28 ENCOUNTER — Ambulatory Visit
Admission: RE | Admit: 2021-03-28 | Discharge: 2021-03-28 | Disposition: A | Discharge status: Home / Self Care | Payer: Medicare Other | Source: Ambulatory Visit | Attending: Radiation Oncology | Admitting: Radiation Oncology

## 2021-03-28 DIAGNOSIS — M810 Age-related osteoporosis without current pathological fracture: Secondary | ICD-10-CM

## 2021-03-28 DIAGNOSIS — Z780 Asymptomatic menopausal state: Secondary | ICD-10-CM | POA: Diagnosis not present

## 2021-03-28 DIAGNOSIS — Z170 Estrogen receptor positive status [ER+]: Secondary | ICD-10-CM | POA: Diagnosis not present

## 2021-03-28 DIAGNOSIS — C50512 Malignant neoplasm of lower-outer quadrant of left female breast: Secondary | ICD-10-CM | POA: Diagnosis not present

## 2021-03-29 ENCOUNTER — Ambulatory Visit: Payer: Medicare Other

## 2021-03-29 ENCOUNTER — Ambulatory Visit
Admission: RE | Admit: 2021-03-29 | Discharge: 2021-03-29 | Disposition: A | Discharge status: Home / Self Care | Payer: Medicare Other | Source: Ambulatory Visit | Attending: Radiation Oncology | Admitting: Radiation Oncology

## 2021-03-29 ENCOUNTER — Other Ambulatory Visit: Payer: Self-pay

## 2021-03-29 DIAGNOSIS — C50512 Malignant neoplasm of lower-outer quadrant of left female breast: Secondary | ICD-10-CM | POA: Diagnosis not present

## 2021-03-29 DIAGNOSIS — Z170 Estrogen receptor positive status [ER+]: Secondary | ICD-10-CM | POA: Diagnosis not present

## 2021-03-30 ENCOUNTER — Ambulatory Visit
Admission: RE | Admit: 2021-03-30 | Discharge: 2021-03-30 | Disposition: A | Discharge status: Home / Self Care | Payer: Medicare Other | Source: Ambulatory Visit | Attending: Radiation Oncology | Admitting: Radiation Oncology

## 2021-03-30 ENCOUNTER — Ambulatory Visit: Payer: Medicare Other

## 2021-03-30 DIAGNOSIS — Z170 Estrogen receptor positive status [ER+]: Secondary | ICD-10-CM | POA: Diagnosis not present

## 2021-03-30 DIAGNOSIS — C50512 Malignant neoplasm of lower-outer quadrant of left female breast: Secondary | ICD-10-CM | POA: Diagnosis not present

## 2021-03-31 ENCOUNTER — Encounter: Payer: Self-pay | Admitting: *Deleted

## 2021-03-31 ENCOUNTER — Ambulatory Visit
Admission: RE | Admit: 2021-03-31 | Discharge: 2021-03-31 | Disposition: A | Discharge status: Home / Self Care | Payer: Medicare Other | Source: Ambulatory Visit | Attending: Radiation Oncology | Admitting: Radiation Oncology

## 2021-03-31 ENCOUNTER — Other Ambulatory Visit: Payer: Self-pay

## 2021-03-31 DIAGNOSIS — Z170 Estrogen receptor positive status [ER+]: Secondary | ICD-10-CM | POA: Diagnosis not present

## 2021-03-31 DIAGNOSIS — C50512 Malignant neoplasm of lower-outer quadrant of left female breast: Secondary | ICD-10-CM | POA: Diagnosis not present

## 2021-04-01 ENCOUNTER — Ambulatory Visit: Payer: Medicare Other

## 2021-04-01 ENCOUNTER — Ambulatory Visit
Admission: RE | Admit: 2021-04-01 | Discharge: 2021-04-01 | Disposition: A | Discharge status: Home / Self Care | Payer: Medicare Other | Source: Ambulatory Visit | Attending: Radiation Oncology | Admitting: Radiation Oncology

## 2021-04-01 DIAGNOSIS — C50512 Malignant neoplasm of lower-outer quadrant of left female breast: Secondary | ICD-10-CM | POA: Diagnosis not present

## 2021-04-01 DIAGNOSIS — Z170 Estrogen receptor positive status [ER+]: Secondary | ICD-10-CM | POA: Diagnosis not present

## 2021-04-04 ENCOUNTER — Other Ambulatory Visit: Payer: Self-pay

## 2021-04-04 ENCOUNTER — Encounter: Payer: Self-pay | Admitting: Radiation Oncology

## 2021-04-04 ENCOUNTER — Ambulatory Visit
Admission: RE | Admit: 2021-04-04 | Discharge: 2021-04-04 | Disposition: A | Discharge status: Home / Self Care | Payer: Medicare Other | Source: Ambulatory Visit | Attending: Radiation Oncology | Admitting: Radiation Oncology

## 2021-04-04 DIAGNOSIS — Z170 Estrogen receptor positive status [ER+]: Secondary | ICD-10-CM | POA: Diagnosis not present

## 2021-04-04 DIAGNOSIS — C50512 Malignant neoplasm of lower-outer quadrant of left female breast: Secondary | ICD-10-CM | POA: Diagnosis not present

## 2021-04-12 NOTE — Progress Notes (Signed)
Katrina Williamson  [phone] [phone]) [phone]     ID: PETRITA BLUNCK DOB: Jun 29, 1955  MR#: [id number]  EPP#:[id number]  Patient Care Team: Leamon Arnt, MD as PCP - General (Family Medicine) Heydi Swango, Virgie Dad, MD as Consulting Physician (Oncology) Stark Klein, MD as Consulting Physician (General Surgery) Kyung Rudd, MD as Consulting Physician (Radiation Oncology) [place], Jennefer Bravo, MD as Referring Physician (Dermatology) Rockwell Germany, RN as Oncology Nurse Navigator Mauro Kaufmann, RN as Oncology Nurse Navigator Chauncey Cruel, MD OTHER MD:  CHIEF COMPLAINT: Estrogen receptor positive lobular breast cancer  CURRENT TREATMENT: Tamoxifen   INTERVAL HISTORY: Katrina Williamson returns today for follow up of her estrogen receptor positive breast cancer. She was evaluated in the breast cancer clinic on 11/30/2020.  She is accompanied by her significant other cannot  She underwent staging breast MRI on 12/08/2020 showing: breast composition C; biopsy-proven malignancy in outer left breast measuring 1.1 cm; no evidence of malignancy in right breast; no abnormal-appearing lymph nodes.  She proceeded to left lumpectomy on 12/29/2020 under Dr. Barry Dienes. Pathology from the procedure ([id number]) showed: invasive lobular carcinoma, grade 2, spanning 1.4 cm; lobular neoplasia; all margins negative.  Both biopsied lymph nodes were negative for carcinoma (0/2).  Oncotype DX was obtained on the final surgical sample and the recurrence score of 22 predicts a risk of recurrence outside the breast over the next 9 years of 8%, if the patient's only systemic therapy is an antiestrogen for 5 years.  It also predicts no benefit from chemotherapy.   She was referred back to Dr. Ida Rogue PA, [facility], on 02/10/2021 to review radiation therapy. She subsequently received treatment from 03/08/2021 through 04/04/2021.   REVIEW OF SYSTEMS: Katrina Williamson did well with her surgery and  generally well with radiation as well.  In the last week of radiation she experienced some fatigue which persisted for at least a week afterwards and she still has not fully recovered but she is exercising regularly chiefly by walking and doing Pilates.  She tells me her primary care physician has diagnosed her with osteoporosis and she is very concerned regarding that.  A detailed review of systems today was otherwise stable.   COVID 19 VACCINATION STATUS: [place] x3, most recently 05/2020   HISTORY OF CURRENT ILLNESS: From the original intake note:  Katrina Williamson," rhyming with "highness") had routine screening mammography on 10/21/2020 showing a possible abnormality in the left breast. She underwent left diagnostic mammography with tomography and left breast ultrasonography at [facility] on 11/15/2020 showing: breast density category C; 0.9 cm mass in left breast at 3:30; normal left axillary lymph node.  Accordingly on 11/16/2020 she proceeded to biopsy of the left breast area in question. The pathology from this procedure ([id number]) showed: invasive mammary carcinoma, e-cadherin negative, grade 2; mammary carcinoma in situ. Prognostic indicators significant for: estrogen receptor, 95% positive with strong staining intensity and progesterone receptor, 90% positive with moderate staining intensity. Proliferation marker Ki67 at 1%. HER2 negative by immunohistochemistry (1+).  Cancer Staging Malignant neoplasm of lower-outer quadrant of left breast of female, estrogen receptor positive ([place]) Staging form: Breast, AJCC 8th Edition - Clinical: Stage IA (cT1b, cN0, cM0, G2, ER+, PR+, HER2-) - Signed by Chauncey Cruel, MD on 11/30/2020 Histologic grading system: 3 grade system  The patient's subsequent history is as detailed below.   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  See old records   Postmenopausal HRT (hormone replacement therapy) 03/14/2018   Menopause at age 42;     PAST SURGICAL HISTORY: Past Surgical History:  Procedure Laterality Date   BREAST LUMPECTOMY WITH RADIOACTIVE SEED AND SENTINEL LYMPH NODE BIOPSY Left 12/29/2020   Procedure: LEFT BREAST LUMPECTOMY WITH RADIOACTIVE SEED;  Surgeon: Stark Klein, MD;  Location: [place];  Service: General;  Laterality: Left;   SENTINEL NODE BIOPSY Left 12/29/2020   Procedure: LEFT AXILLARY SENTINEL NODE BIOPSY;  Surgeon: Stark Klein, MD;  Location: [place];  Service: General;  Laterality: Left;  Wisdom teeth removal without complications   FAMILY HISTORY: Family History  Problem Relation Age of Onset   Arthritis Mother    Hearing loss Mother    Stroke Father    Alcohol abuse Sister    Arthritis Sister    Cancer Sister   The patient's father died at the age of 91 from old age.  He was of "[place]" descent.  The patient's mother also died at the age of 72.  She had severe osteoporosis.  The patient has 2 sisters, no brothers.  1 sister had uterine cancer.  There is no other history of cancer in the family to the patient's knowledge   GYNECOLOGIC HISTORY:  No LMP recorded. Patient is postmenopausal. GX P 0 LMP age 25 (2002) HRT yes, used for 20 years, stopped  11/30/2018 Hysterectomy? no BSO? 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  She went through a divorce in 2019.  Currently she shares a home with Doralee Albino, who is retired from the foreign service.  He has a son, 70 year old, who runs a business in [place].  The patient and Nada Boozer  also share 3 cats.  The patient is not a church attender    ADVANCED  DIRECTIVES: Not in place; at the 11/30/2020 visit the patient was given the appropriate documents to complete and notarized at her discretion.   HEALTH MAINTENANCE: Social History   Tobacco Use   Smoking status: Never   Smokeless tobacco: Never  Vaping Use   Vaping Use: Never used  Substance Use Topics   Alcohol use: Yes    Alcohol/week: 2.0 standard drinks    Types: 1 Glasses of wine, 1 Shots of liquor per week   Drug use: Not Currently    Types: Marijuana     Colonoscopy: 06/2014 (performed in [place])  PAP: 05/2019, negative  Bone density: 07/2018, -2.6, repeat scheduled 03/2021   No Known Allergies  Current Outpatient Medications  Medication Sig Dispense Refill   tamoxifen (NOLVADEX) 20 MG tablet Take 1 tablet (20 mg total) by mouth daily. 90 tablet 4   acetaminophen (TYLENOL) 500 MG tablet Take 1,000 mg by mouth every 8 (eight) hours as needed (PAIN).     albuterol (VENTOLIN HFA) 108 (90 Base) MCG/ACT inhaler Inhale 2 puffs into the lungs every 4 (four) hours as needed for wheezing or shortness of breath. 1 each 2   aspirin-acetaminophen-caffeine (EXCEDRIN MIGRAINE) 250-250-65 MG tablet Take 2 tablets by mouth every 6 (six) hours as needed for headache.     Calcium Citrate-Vitamin D (CITRACAL  PETITES/VITAMIN D) 200-250 MG-UNIT TABS Take 3 tabs twice a day (Patient not taking: Reported on 02/10/2021) 60 tablet    cholecalciferol (VITAMIN D) 25 MCG (1000 UNIT) tablet Take 1,000 Units by mouth at bedtime. (Patient not taking: Reported on 02/10/2021)     ibuprofen (ADVIL) 200 MG tablet Take 400 mg by mouth every 8 (eight) hours as needed (PAIN.).     sertraline (ZOLOFT) 50 MG tablet TAKE 1 TABLET BY MOUTH EVERY DAY (Patient taking differently: Take 50 mg by mouth at bedtime.) 90 tablet 3   No current facility-administered medications for this visit.    OBJECTIVE: White woman who appears younger than stated age  65:   04/13/21 1205  BP: (!) 101/59  Pulse: 72  Resp: 16  Temp:  98.7 F (37.1 C)  SpO2: 99%      Body mass index is 24.85 kg/m.   Wt Readings from Last 3 Encounters:  04/13/21 144 lb 12.8 oz (65.7 kg)  02/10/21 139 lb 6.4 oz (63.2 kg)  12/29/20 135 lb (61.2 kg)     ECOG FS:1 - Symptomatic but completely ambulatory  Sclerae unicteric, EOMs intact Wearing a mask No cervical or supraclavicular adenopathy Lungs no rales or rhonchi Heart regular rate and rhythm Abd soft, nontender, positive bowel sounds MSK no focal spinal tenderness, no upper extremity lymphedema Neuro: nonfocal, well oriented, appropriate affect Breasts: The right breast is unremarkable.  The left breast is status postlumpectomy and radiation.  The hyperpigmentation is clearing.  The cosmetic result is very good.  There is no evidence of local recurrence.  Both axillae are benign   LAB RESULTS:  CMP     Component Value Date/Time   NA 141 04/13/2021 1117   NA 139 04/06/2016 0000   K 4.0 04/13/2021 1117   CL 106 04/13/2021 1117   CO2 27 04/13/2021 1117   GLUCOSE 86 04/13/2021 1117   BUN 22 04/13/2021 1117   BUN 19 04/06/2016 0000   CREATININE 1.01 (H) 04/13/2021 1117   CREATININE 1.17 (H) 11/30/2020 1530   CALCIUM 9.3 04/13/2021 1117   PROT 6.8 04/13/2021 1117   ALBUMIN 4.1 04/13/2021 1117   AST 25 04/13/2021 1117   AST 18 11/30/2020 1530   ALT 24 04/13/2021 1117   ALT 18 11/30/2020 1530   ALKPHOS 80 04/13/2021 1117   BILITOT 0.6 04/13/2021 1117   BILITOT 0.6 11/30/2020 1530   GFRNONAA >60 04/13/2021 1117   GFRNONAA 52 (L) 11/30/2020 1530    No results found for: TOTALPROTELP, ALBUMINELP, A1GS, A2GS, BETS, BETA2SER, GAMS, MSPIKE, SPEI  Lab Results  Component Value Date   WBC 4.1 04/13/2021   NEUTROABS 2.9 04/13/2021   HGB 13.2 04/13/2021   HCT 40.1 04/13/2021   MCV 91.3 04/13/2021   PLT 182 04/13/2021    No results found for: LABCA2  No components found for: [id number]  No results for input(s): INR in the last 168 hours.  No results found for:  LABCA2  No results found for: RSW546  No results found for: EVO350  No results found for: KXF818  No results found for: CA2729  No components found for: HGQUANT  No results found for: CEA1 / No results found for: CEA1   No results found for: AFPTUMOR  No results found for: CHROMOGRNA  No results found for: KPAFRELGTCHN, LAMBDASER, KAPLAMBRATIO (kappa/lambda light chains)  No results found for: HGBA, HGBA2QUANT, HGBFQUANT, HGBSQUAN (Hemoglobinopathy evaluation)   No results found for: LDH  No results found for: IRON, TIBC,  IRONPCTSAT (Iron and TIBC)  No results found for: FERRITIN  Urinalysis No results found for: COLORURINE, APPEARANCEUR, LABSPEC, PHURINE, GLUCOSEU, HGBUR, BILIRUBINUR, KETONESUR, PROTEINUR, UROBILINOGEN, NITRITE, LEUKOCYTESUR   STUDIES: DG BONE DENSITY (DXA)  Result Date: 03/28/2021 EXAM: DUAL X-RAY ABSORPTIOMETRY (DXA) FOR BONE MINERAL DENSITY IMPRESSION: Referring Physician:  [place] Your patient completed a bone mineral density test using GE Lunar iDXA system (analysis version: 16). Technologist: [place] PATIENT: Name: Katrina Williamson, Katrina Williamson Patient ID: [id number] Birth Date: 1955-08-19 Height: 63.5 in. Sex: Female Measured: 03/28/2021 Weight: 141.2 lbs. Indications: Albuterol, Breast Cancer History, Caucasian, Estrogen Deficient, Family Hist. (Parent hip fracture), Postmenopausal, Zoloft Fractures: Rib Treatments: Calcium (E943.0), Estradiol Patch, Vitamin D (E933.5) ASSESSMENT: The BMD measured at Forearm Radius 33% is 0.644 g/cm2 with a T-score of -2.7. This patient is considered OSTEOPOROTIC according to [facility]) criteria. The quality of the exam is good. The lumbar spine was excluded due to degenerative changes. Site Region Measured Date Measured Age YA BMD Significant CHANGE T-score Left Forearm Radius 33% 03/28/2021 65.5 -2.7 0.644 g/cm2 DualFemur Total Right 03/28/2021 65.5 -2.6 0.681 g/cm2 DualFemur Total Right 07/10/2018 62.8 -2.6  0.685 g/cm2 DualFemur Total Mean 03/28/2021 65.5 -2.5 0.687 g/cm2 DualFemur Total Mean 07/10/2018 62.8 -2.4 0.699 g/cm2 World Health Organization [facility]) criteria for post-menopausal, Caucasian Women: Normal       T-score at or above -1 SD Osteopenia   T-score between -1 and -2.5 SD Osteoporosis T-score at or below -2.5 SD RECOMMENDATION: 1. All patients should optimize calcium and vitamin D intake. 2. Consider FDA-approved medical therapies in postmenopausal women and men aged 31 years and older, based on the following: a. A hip or vertebral (clinical or morphometric) fracture. b. T-score = -2.5 at the femoral neck or spine after appropriate evaluation to exclude secondary causes. c. Low bone mass (T-score between -1.0 and -2.5 at the femoral neck or spine) and a 10-year probability of a hip fracture = 3% or a 10-year probability of a major osteoporosis-related fracture = 20% based on the [place]-adapted WHO algorithm. d. Clinician judgment and/or patient preferences may indicate treatment for people with 10-year fracture probabilities above or below these levels. FOLLOW-UP: Patients with diagnosis of osteoporosis or at high risk for fracture should have regular bone mineral density tests.? Patients eligible for Medicare are allowed routine testing every 2 years.? The testing frequency can be increased to one year for patients who have rapidly progressing disease, are receiving or discontinuing medical therapy to restore bone mass, or have additional risk factors. I have reviewed this study and agree with the findings. Mark A. Thornton Papas, M.D. [facility] Electronically Signed   By: Lavonia Dana M.D.   On: 03/28/2021 14:36     ELIGIBLE FOR AVAILABLE RESEARCH PROTOCOL: no  ASSESSMENT: 65 y.o. [place] woman status post left breast lower outer quadrant biopsy 11/16/2020 for T1b N0, stage IA invasive lobular carcinoma (E-cadherin negative), estrogen and progesterone receptor positive, HER2 not amplified, with  an MIB-1 of 1%  (1) status post left lumpectomy and sentinel lymph node sampling 12/29/2020 for a pT1c pN0,  stage IA invasive lobular carcinoma, grade 2, with negative margins.  (A) a total of 2 left extra lymph nodes were removed  (2) adjuvant radiation completed 04/04/2021  (3) tamoxifen started 04/13/2021   PLAN: Katrina Williamson has completed local treatment for her stage I breast cancer and is now ready to proceed to antiestrogen therapy.  We discussed the difference between tamoxifen and anastrozole in detail. She understands that  anastrozole and the aromatase inhibitors in general work by blocking estrogen production. Accordingly vaginal dryness, decrease in bone density, and of course hot flashes can result. The aromatase inhibitors can also negatively affect the cholesterol profile, although that is a minor effect. One out of 5 women on aromatase inhibitors we will feel "old and achy". This arthralgia/myalgia syndrome, which resembles fibromyalgia clinically, does resolve with stopping the medications. Accordingly this is not a reason to not try an aromatase inhibitor but it is a frequent reason to stop it (in other words 20% of women will not be able to tolerate these medications).  Tamoxifen on the other hand does not block estrogen production. It does not "take away a woman's estrogen". It blocks the estrogen receptor in breast cells. Like anastrozole, it can also cause hot flashes. As opposed to anastrozole, tamoxifen has many estrogen-like effects. It is technically an estrogen receptor modulator. This means that in some tissues tamoxifen works like estrogen-- for example it helps strengthen the bones. It tends to improve the cholesterol profile. It can cause thickening of the endometrial lining, and even endometrial polyps or rarely cancer of the uterus.(The risk of uterine cancer due to tamoxifen is one additional cancer per thousand women year). It can cause vaginal wetness or stickiness. It can  cause blood clots through this estrogen-like effect--the risk of blood clots with tamoxifen is exactly the same as with birth control pills or hormone replacement.  Neither of these agents causes mood changes or weight gain, despite the popular belief that they can have these side effects. We have data from studies comparing either of these drugs with placebo, and in those cases the control group had the same amount of weight gain and depression as the group that took the drug.  I think she would do well with tamoxifen and I went ahead and wrote that prescription for her.  If after some weeks or months she discovered significant side effects then we can always change to anastrozole.  At this point as she would like to turn her care over to my partners in [place].  Specifically she would like to have a woman oncologist so we are setting her up to see Dr. Hinton Rao sometime in February.  Total encounter time today 40 minutes.Sarajane Jews C. Anouk Critzer, MD 04/13/2021 5:09 PM Medical Oncology and Hematology [facility] [place], [place] [address] Tel. [phone]    Fax. [phone]   This document serves as a record of services personally performed by Lurline Del, MD. It was created on his behalf by Wilburn Mylar, a trained medical scribe. The creation of this record is based on the scribe's personal observations and the provider's statements to them.   I, Lurline Del MD, have reviewed the above documentation for accuracy and completeness, and I agree with the above.   *Total Encounter Time as defined by the Centers for Medicare and Medicaid Services includes, in addition to the face-to-face time of a patient visit (documented in the note above) non-face-to-face time: obtaining and reviewing outside history, ordering and reviewing medications, tests or procedures, care coordination (communications with other health care professionals or caregivers) and  documentation in the medical record.

## 2021-04-13 ENCOUNTER — Inpatient Hospital Stay: Payer: Medicare Other | Attending: Oncology

## 2021-04-13 ENCOUNTER — Inpatient Hospital Stay (HOSPITAL_BASED_OUTPATIENT_CLINIC_OR_DEPARTMENT_OTHER): Payer: Medicare Other | Admitting: Oncology

## 2021-04-13 ENCOUNTER — Other Ambulatory Visit: Payer: Self-pay

## 2021-04-13 VITALS — BP 101/59 | HR 72 | Temp 98.7°F | Resp 16 | Ht 64.0 in | Wt 144.8 lb

## 2021-04-13 DIAGNOSIS — Z170 Estrogen receptor positive status [ER+]: Secondary | ICD-10-CM

## 2021-04-13 DIAGNOSIS — C50512 Malignant neoplasm of lower-outer quadrant of left female breast: Secondary | ICD-10-CM | POA: Insufficient documentation

## 2021-04-13 DIAGNOSIS — Z79811 Long term (current) use of aromatase inhibitors: Secondary | ICD-10-CM | POA: Diagnosis not present

## 2021-04-13 DIAGNOSIS — Z923 Personal history of irradiation: Secondary | ICD-10-CM | POA: Diagnosis not present

## 2021-04-13 LAB — COMPREHENSIVE METABOLIC PANEL
ALT: 24 U/L (ref 0–44)
AST: 25 U/L (ref 15–41)
Albumin: 4.1 g/dL (ref 3.5–5.0)
Alkaline Phosphatase: 80 U/L (ref 38–126)
Anion gap: 8 (ref 5–15)
BUN: 22 mg/dL (ref 8–23)
CO2: 27 mmol/L (ref 22–32)
Calcium: 9.3 mg/dL (ref 8.9–10.3)
Chloride: 106 mmol/L (ref 98–111)
Creatinine, Ser: 1.01 mg/dL — ABNORMAL HIGH (ref 0.44–1.00)
GFR, Estimated: >60 mL/min (ref >60)
Glucose, Bld: 86 mg/dL (ref 70–99)
Potassium: 4 mmol/L (ref 3.5–5.1)
Sodium: 141 mmol/L (ref 135–145)
Total Bilirubin: 0.6 mg/dL (ref 0.3–1.2)
Total Protein: 6.8 g/dL (ref 6.5–8.1)

## 2021-04-13 LAB — CBC WITH DIFFERENTIAL/PLATELET
Abs Immature Granulocytes: 0.01 10*3/uL (ref 0.00–0.07)
Basophils Absolute: 0 10*3/uL (ref 0.0–0.1)
Basophils Relative: 1 %
Eosinophils Absolute: 0.2 10*3/uL (ref 0.0–0.5)
Eosinophils Relative: 4 %
HCT: 40.1 % (ref 36.0–46.0)
Hemoglobin: 13.2 g/dL (ref 12.0–15.0)
Immature Granulocytes: 0 %
Lymphocytes Relative: 15 %
Lymphs Abs: 0.6 10*3/uL — ABNORMAL LOW (ref 0.7–4.0)
MCH: 30.1 pg (ref 26.0–34.0)
MCHC: 32.9 g/dL (ref 30.0–36.0)
MCV: 91.3 fL (ref 80.0–100.0)
Monocytes Absolute: 0.4 10*3/uL (ref 0.1–1.0)
Monocytes Relative: 9 %
Neutro Abs: 2.9 10*3/uL (ref 1.7–7.7)
Neutrophils Relative %: 71 %
Platelets: 182 10*3/uL (ref 150–400)
RBC: 4.39 MIL/uL (ref 3.87–5.11)
RDW: 12.3 % (ref 11.5–15.5)
WBC: 4.1 10*3/uL (ref 4.0–10.5)
nRBC: 0 % (ref 0.0–0.2)

## 2021-04-13 MED ORDER — TAMOXIFEN CITRATE 20 MG PO TABS: 20.0000 mg | ORAL_TABLET | Freq: Every day | ORAL | 4 refills | Status: AC

## 2021-04-15 NOTE — Progress Notes (Signed)
                                                                                                                                                             Patient Name: Katrina Williamson MRN: [id number] DOB: [birth year] Referring Physician: Billey Chang (Profile Not Attached) Date of Service: 04/04/2021 [facility], [place]                                                        End Of Treatment Note  Diagnoses: C50.512-Malignant neoplasm of lower-outer quadrant of left female breast  Cancer Staging: Stage  IA, pT1cN0M0 grade 2, ER/PR positive invasive lobular carcinoma of the left breast.   Intent: Curative  Radiation Treatment Dates: 03/08/2021 through 04/04/2021 Site Technique Total Dose (Gy) Dose per Fx (Gy) Completed Fx Beam Energies  Breast, Left: Breast_Lt 3D 42.56/42.56 2.66 16/16 6X  Breast, Left: Breast_Lt_Bst 3D 8/8 2 4/4 6X   Narrative: The patient tolerated radiation therapy relatively well. She developed fatigue and anticipated skin changes in the treatment field.   Plan: The patient will receive a call in about one month from the radiation oncology department. She will continue follow up with Dr. Jana Hakim as well.   ________________________________________________    Carola Rhine, [facility]

## 2021-04-19 ENCOUNTER — Telehealth: Payer: Self-pay | Admitting: *Deleted

## 2021-04-22 ENCOUNTER — Telehealth: Payer: Self-pay | Admitting: *Deleted

## 2021-05-02 ENCOUNTER — Ambulatory Visit
Admission: RE | Admit: 2021-05-02 | Discharge: 2021-05-02 | Disposition: A | Discharge status: Home / Self Care | Payer: Medicare Other | Source: Ambulatory Visit | Attending: Radiation Oncology | Admitting: Radiation Oncology

## 2021-05-02 ENCOUNTER — Encounter: Payer: Self-pay | Admitting: Oncology

## 2021-05-02 DIAGNOSIS — C50512 Malignant neoplasm of lower-outer quadrant of left female breast: Secondary | ICD-10-CM | POA: Insufficient documentation

## 2021-05-02 DIAGNOSIS — Z170 Estrogen receptor positive status [ER+]: Secondary | ICD-10-CM | POA: Insufficient documentation

## 2021-05-02 NOTE — Progress Notes (Signed)
  Radiation Oncology         [phone] ________________________________  Name: Katrina Williamson MRN: [id number]  Date of Service: 05/02/2021  DOB: 06-16-1955  Post Treatment Telephone Note  Diagnosis:   Stage  IA, pT1cN0M0 grade 2, ER/PR positive invasive lobular carcinoma of the left breast.   Interval Since Last Radiation:  4 weeks   03/08/2021 through 04/04/2021 Site Technique Total Dose (Gy) Dose per Fx (Gy) Completed Fx Beam Energies  Breast, Left: Breast_Lt 3D 42.56/42.56 2.66 16/16 6X  Breast, Left: Breast_Lt_Bst 3D 8/8 2 4/4 6X    Narrative:  The patient was contacted today for routine follow-up. During treatment she did very well with radiotherapy and did not have significant desquamation.    Impression/Plan: 1. Stage  IA, pT1cN0M0 grade 2, ER/PR positive invasive lobular carcinoma of the left breast. I was unable to reach the patient but left a voicemail and on the message, I   discussed that we would be happy to continue to follow her as needed, but she will also continue to follow up with Dr. Jana Hakim in medical oncology. She was counseled on skin care as well as measures to avoid sun exposure to this area.  2. Survivorship. We discussed the importance of survivorship evaluation and encouraged her to attend her upcoming visit with that clinic.       Carola Rhine, PAC

## 2021-05-06 ENCOUNTER — Inpatient Hospital Stay: Payer: Medicare Other | Attending: Oncology | Admitting: *Deleted

## 2021-05-06 ENCOUNTER — Other Ambulatory Visit: Payer: Self-pay

## 2021-05-06 ENCOUNTER — Encounter: Payer: Self-pay | Admitting: *Deleted

## 2021-05-06 DIAGNOSIS — C50512 Malignant neoplasm of lower-outer quadrant of left female breast: Secondary | ICD-10-CM

## 2021-05-06 DIAGNOSIS — Z170 Estrogen receptor positive status [ER+]: Secondary | ICD-10-CM

## 2021-05-06 NOTE — Progress Notes (Signed)
  2 Identifiers used for this visit for verification purposes. SCP reviewed and completed. SDOH assessed and completed. Pt seems to be tolerating anti-estrogen well.  She doesn't seem to be bothered by hot flashes much. Denies joint pain. She has started back trying to exercise, she does stretching and yoga 3x a week. Her sleep hygiene is good with an average of 8-9 hours nightly.  She does experience intermittent sharp pains to left breast sometimes and has some numbness were surgical incision is to the left breast. Last colonoscopy was in 2016. Last DEXA  03/28/2021. I will schedule her next mammogram for June,2023 at the [facility]. Pt will be scheduled to see new oncologist in [place] in the new year per Dr. Virgie Dad recommendation.

## 2021-05-09 ENCOUNTER — Other Ambulatory Visit: Payer: Self-pay | Admitting: *Deleted

## 2021-05-10 ENCOUNTER — Other Ambulatory Visit: Payer: Self-pay | Admitting: Oncology

## 2021-05-10 DIAGNOSIS — Z9889 Other specified postprocedural states: Secondary | ICD-10-CM

## 2021-05-19 DIAGNOSIS — Z23 Encounter for immunization: Secondary | ICD-10-CM | POA: Diagnosis not present

## 2021-06-08 ENCOUNTER — Other Ambulatory Visit: Payer: Self-pay

## 2021-06-08 ENCOUNTER — Ambulatory Visit (INDEPENDENT_AMBULATORY_CARE_PROVIDER_SITE_OTHER): Payer: Medicare Other | Admitting: Family Medicine

## 2021-06-08 ENCOUNTER — Encounter: Payer: Self-pay | Admitting: Family Medicine

## 2021-06-08 VITALS — BP 130/68 | HR 68 | Temp 98.1°F | Ht 64.0 in | Wt 145.2 lb

## 2021-06-08 DIAGNOSIS — F4322 Adjustment disorder with anxiety: Secondary | ICD-10-CM | POA: Diagnosis not present

## 2021-06-08 DIAGNOSIS — Z8262 Family history of osteoporosis: Secondary | ICD-10-CM | POA: Diagnosis not present

## 2021-06-08 DIAGNOSIS — Z170 Estrogen receptor positive status [ER+]: Secondary | ICD-10-CM

## 2021-06-08 DIAGNOSIS — M810 Age-related osteoporosis without current pathological fracture: Secondary | ICD-10-CM | POA: Diagnosis not present

## 2021-06-08 DIAGNOSIS — C50512 Malignant neoplasm of lower-outer quadrant of left female breast: Secondary | ICD-10-CM

## 2021-06-08 MED ORDER — ALENDRONATE SODIUM 70 MG PO TABS: 70.0000 mg | ORAL_TABLET | ORAL | 3 refills | Status: DC

## 2021-06-08 NOTE — Progress Notes (Signed)
Subjective  CC:  Chief Complaint  Patient presents with   Osteoporosis    Discuss bone density   Anxiety    Wants to get off of zoloft    HPI: Katrina Williamson is a 66 y.o. female who presents to the office today to address the problems listed above in the chief complaint. 66 year old female with osteoporosis with most recent bone density scan showing mild worsening.  Recommend treatment.  However she recently started tamoxifen for treatment of her breast cancer.  She completed radiation therapy in October.  She still has bouts of fatigue but overall is tolerating tamoxifen well.  She has questions about starting a new medication for bone density. Start Zoloft for congestion and anxiety related to work back in 2007.  Has done very well for her.  However, she has retired almost 2 years ago and now wonders if she still needs it.  She would like to take less medicine if possible.  She would like to consider coming off the medication.  Again, it was treating anxiety and stress related reactions that were all work-related.  She feels now her life is very stable.  In a healthy monogamous relationship.  Does not feel an undue amount of stress regarding her breast cancer diagnosis.  No history of depression.  She was never treated for mood disorders prior to starting Zoloft.  Assessment  1. Osteoporosis without current pathological fracture, unspecified osteoporosis type   2. Adjustment disorder with anxiety   3. Family history of osteoporosis   4. Malignant neoplasm of lower-outer quadrant of left breast of female, estrogen receptor positive ([place])      Plan  Osteoporosis: Strong family history.  Osteoporosis in spite of having been on HRT for a number of years.  Recommend treatment.  Fortunately, she is on tamoxifen which should also help her bone health.  We discussed treatment options.  Elected Fosamax.  We will defer starting this medication until she is tolerating tamoxifen well for 3 months.   This way she can better identify any side effects from either medicines.  Continue vitamin D and calcium supplements.  Continue weightbearing exercising.  Recheck bone density in 2 years.  May be able to stop Fosamax at that time bone density has improved while on tamoxifen as well. Adjustment disorder: Has been very stable for a number of years.  Agree with coming off medications.  Discussed weaning over 3 weeks.  Recommending deferring this change until she has started the Fosamax and tolerates it.  She will also consider the stress related to her breast cancer diagnosis..   Follow up: May for complete physical and recheck Visit date not found  No orders of the defined types were placed in this encounter.  Meds ordered this encounter  Medications   alendronate (FOSAMAX) 70 MG tablet    Sig: Take 1 tablet (70 mg total) by mouth every 7 (seven) days. Take with a full glass of water on an empty stomach.    Dispense:  12 tablet    Refill:  3      I reviewed the patients updated PMH, FH, and SocHx.    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   albuterol (VENTOLIN HFA) 108 (90 Base) MCG/ACT inhaler Inhale 2 puffs into the lungs every 4 (four) hours as needed for wheezing or shortness of breath.   alendronate (FOSAMAX) 70 MG tablet Take 1 tablet (70 mg total) by mouth every 7 (seven) days. Take with a full glass of water on an empty stomach.    aspirin-acetaminophen-caffeine (EXCEDRIN MIGRAINE) 250-250-65 MG tablet Take 2 tablets by mouth every 6 (six) hours as needed for headache.   ibuprofen (ADVIL) 200 MG tablet Take 400 mg by mouth every 8 (eight) hours as needed (PAIN.).   sertraline (ZOLOFT) 50 MG tablet TAKE 1 TABLET BY MOUTH EVERY DAY (Patient taking differently: Take 50 mg by mouth at bedtime.)    Allergies: Patient has No Known Allergies. Family History: Patient family history includes Alcohol abuse in her sister; Arthritis in her mother and sister; Cancer in her sister; Hearing loss in her mother; Stroke in her father. Social History:  Patient  reports that she has never smoked. She has never used smokeless tobacco. She reports current alcohol use of about 2.0 standard drinks per week. She reports that she does not currently use drugs after having used the following drugs: Marijuana.  Review of Systems: Constitutional: Negative for fever malaise or anorexia Cardiovascular: negative for chest pain Respiratory: negative for SOB or persistent cough Gastrointestinal: negative for abdominal pain  Objective  Vitals: BP 130/68    Pulse 68    Temp 98.1 F (36.7 C) (Temporal)    Ht 5\' 4"  (1.626 m)    Wt 145 lb 3.2 oz (65.9 kg)    SpO2 99%    BMI 24.92 kg/m  General: no acute distress , A&Ox3     Commons side effects, risks, benefits, and alternatives for medications and treatment plan prescribed today were discussed, and the patient expressed understanding of the given instructions. Patient is instructed to call or message via MyChart if he/she has any questions or concerns regarding our treatment plan. No barriers to understanding were identified. We discussed Red Flag symptoms and signs in detail. Patient expressed understanding regarding what to do in case of urgent or emergency type symptoms.  Medication list was reconciled, printed and provided to the patient in AVS. Patient instructions and summary information was  reviewed with the patient as documented in the AVS. This note was prepared with assistance of Dragon voice recognition software. Occasional wrong-word or sound-a-like substitutions may have occurred due to the inherent limitations of voice recognition software  This visit occurred during the SARS-CoV-2 public health emergency.  Safety protocols were in place, including screening questions prior to the visit, additional usage of staff PPE, and extensive cleaning of exam room while observing appropriate contact time as indicated for disinfecting solutions.

## 2021-06-08 NOTE — Patient Instructions (Signed)
Please return in May 2023 for your annual complete physical; please come fasting and recheck.  You may start the fosamax in February if you are tolerating the tamoxifen well. Tamoxifen actually has a positive effect on bone health as well. This should keep your bones from worsening.   Start weaning from zoloft 6-12 weeks after starting the fosamax as we discussed: Take 1/2 tab daily for a week, Then 1/2 tab every other day for a week, Then 1/2 tab on T and Fri, then stop.  If you have any questions or concerns, please don't hesitate to send me a message via MyChart or call the office at [phone]. Thank you for visiting with [place] today! It's our pleasure caring for you.

## 2021-07-04 ENCOUNTER — Telehealth: Payer: Self-pay | Admitting: Oncology

## 2021-07-04 NOTE — Telephone Encounter (Signed)
Called pt to schedule an appt. VM was left.  Entered by Hosie Poisson H on 05/10/2021 at  1:14 PM Priority: Routine <No visit type provided>  Department: [facility]  Provider:   Scheduling Notes:  This is a new pt to me, being seen by Dr. Jana Hakim and he has rec I f/u in the new year.  Not really a new pt to the practice so don't need a full hour    He is retiring 12/31 so will prob send any breast cancer patients that live in this direction.

## 2021-07-06 ENCOUNTER — Telehealth: Payer: Self-pay | Admitting: Oncology

## 2021-07-06 NOTE — Telephone Encounter (Signed)
Patient has been scheduled for an appt with Dr.McCarty. Aware of appt date and time

## 2021-07-06 NOTE — Telephone Encounter (Signed)
Called pt to schedule an appt per staff msg from Dr.McCarty. VM was left for patient to call me back to schedule.

## 2021-07-18 ENCOUNTER — Other Ambulatory Visit: Payer: Self-pay | Admitting: Oncology

## 2021-07-18 DIAGNOSIS — Z170 Estrogen receptor positive status [ER+]: Secondary | ICD-10-CM

## 2021-07-18 DIAGNOSIS — C50512 Malignant neoplasm of lower-outer quadrant of left female breast: Secondary | ICD-10-CM

## 2021-07-18 NOTE — Progress Notes (Signed)
°[facility]  °[address] °[place],  [place]  [address] °[phone] ° °Clinic Day:  07/19/2021 ° °Referring physician: Andy, Camille L, MD ° °This document serves as a record of services personally performed by Christine McCarty, MD. It was created on their behalf by Curry,Lauren E, a trained medical scribe. The creation of this record is based on the scribe's personal observations and the provider's statements to them. ° °ASSESSMENT & PLAN:  ° °Stage IA hormone receptor positive invasive lobular carcinoma, diagnosed in June 2022. This was treated with lumpectomy and adjuvant radiation. She was placed on hormonal therapy with Tamoxifen in November 2022, and will continue this for a total of 5 years. ° °Osteoporosis, for which she started alendronate last week. She will be due for repeat bone density in October 2024. ° °Small firm ridge of the right breast at 4 o'clock. We will obtain diagnostic imaging for further evaluation. ° °This is a pleasant 65 year old female with stage IA hormone receptor positive left breast cancer. She has continued tamoxifen without difficulty and will continue this for a total of 5 years. In view of the above findings of the right breast, we will schedule her for diagnostic unilateral right mammogram and ultrasound for further evaluation. If all is well, we will plan to see her back in 3 months for repeat evaluation. Her annual bilateral mammogram is scheduled for May. If the diagnostic imaging is suspicious, we will bring her back sooner. She and her partner understand and agree with this plan of care. I have answered their questions and she knows to call with any concerns. ° °Thank you for the opportunity to participate in the care of your patients ° °I provided 45 minutes of face-to-face time during this this encounter and > 50% was spent counseling as documented under my assessment and plan.  ° ° °Christine H McCarty, MD °[facility] °[facility] °[address] °[place] [address] °Dept: [phone] °Dept Fax: [phone]  ° °CHIEF COMPLAINT:  °CC: Stage IA hormone receptor positive left breast cancer ° °Current Treatment:  Tamoxifen ° ° °HISTORY OF PRESENT ILLNESS:  °Katrina Williamson is a 65 y.o. female who presents as a transfer of care from Dr. Magrinat's office for the continued management and treatment of stage IA (T1b N0 M0) hormone receptor positive invasive lobular carcinoma of the left breast, diagnosed in June 2022. This was found on screening mammogram from May 2022 which showed possible abnormality in the left breast. Diagnostic imaging from June confirmed a 0.9 mm mass in the left breast at 3:30 o'clock. No adenopathy was identified. Biopsy was pursued on June 14th and pathology revealed invasive mammary carcinoma, grade 2, and mammary carcinoma in situ. Estrogen receptor was positive at 95% and progesterone receptor was positive at 90%. HER2 was negative 1+. Ki67 was 1%. Bilateral breast MRI from July revealed a biopsy proven malignancy measuring 1.1 cm within the left breast. Right breast was negative. She underwent lumpectomy with Dr. Byerly on July 27th and final pathology from this procedure confirmed invasive lobular carcinoma, grade 2, 1.4 cm, lobular neoplasia. All margins were negative. Two sentinel lymph nodes were negative for carcinoma (0/2). She did have some mild complications after her procedure with opening of the incisions, which required packing. Oncotype revealed a recurrence score of 22 and predicts a risk of recurrence outside the breast over the next 9 years of 8%, if the patient's only   systemic therapy is an antiestrogen for 5 years.  It also predicted  no benefit from chemotherapy. She received adjuvant radiation to the left breast, and was started on adjuvant endocrine therapy with tamoxifen in November 2022, with plans of 5 years of therapy.  ° °INTERVAL  HISTORY:  °I have reviewed her chart and materials related to her cancer extensively and collaborated history with the patient. Summary of oncologic history is as follows: °Oncology History  °Malignant neoplasm of lower-outer quadrant of left breast of female, estrogen receptor positive ([facility])  °11/29/2020 Initial Diagnosis  ° Malignant neoplasm of lower-outer quadrant of left breast of female, estrogen receptor positive ([facility]) °  °11/30/2020 Cancer Staging  ° Staging form: Breast, AJCC 8th Edition °- Clinical: Stage IA (cT1b, cN0, cM0, G2, ER+, PR+, HER2-) - Signed by Magrinat, Gustav C, MD on 11/30/2020 °Histologic grading system: 3 grade system ° °  °12/29/2020 Surgery  ° Left breast lumpectomy with sentinel lymph node. Two lymph nodes biopsied, both negative for carcinoma. °  ° Radiation Therapy  ° 03/08/2021 through 04/04/2021 °Site Technique Total Dose (Gy) Dose per Fx (Gy) Completed Fx Beam Energies  °Breast, Left: Breast_Lt 3D 42.56/42.56 2.66 16/16 6X  °Breast, Left: Breast_Lt_Bst 3D 8/8 2 4/4 6X  °  ° Surgery  °  °  ° ° °Katrina Williamson states that she is doing well and denies complaints. She continues tamoxifen daily without significant difficulty. [facility] She does have occasional pain of the left breast/axilla. Bone density in October 2022 revealed osteoporosis and she was recently started on alendronate 70 mg weekly. Her  appetite is good, and she is eating well. She denies any significant unintentional weight loss or gain.  She denies fever, chills or other signs of infection.  She denies nausea, vomiting, bowel issues, or abdominal pain.  She denies sore throat, cough, dyspnea, or chest pain. She started menarche around age 12. She is nulliparous. She was on oral estradiol and Prometrium for around 20 years, due to her family history of severe osteoporosis. She is up to date on colonoscopy as of 2016, and repeat in 5-10 years was recommended. 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  See old records  ° Osteoporosis   ° Postmenopausal HRT (hormone replacement therapy) 03/14/2018  ° Menopause at age 45;   ° ° °Past Surgical History:  °Procedure Laterality Date  ° BREAST LUMPECTOMY WITH RADIOACTIVE SEED AND SENTINEL LYMPH NODE BIOPSY Left 12/29/2020  ° Procedure: LEFT BREAST LUMPECTOMY WITH RADIOACTIVE SEED;  Surgeon: Byerly, Faera, MD;  Location: [facility] OR;  Service: General;  Laterality: Left;  ° SENTINEL NODE BIOPSY Left 12/29/2020  ° Procedure: LEFT AXILLARY SENTINEL NODE BIOPSY;  Surgeon: Byerly, Faera, MD;  Location: [facility] OR;  Service: General;  Laterality: Left;  ° WISDOM TOOTH EXTRACTION    ° ° °Family History  °Problem Relation Age of Onset  ° Arthritis Mother   ° Hearing loss Mother   ° Osteoporosis Mother   °     significant  ° Stroke Father   ° Alcohol abuse Sister   °   Arthritis Sister    Cancer Sister        uterine; 17s    Social History:  reports that she has never smoked. She has never used smokeless tobacco. She reports current alcohol use of about 2.0 standard drinks per week. She reports that she does not currently use drugs after having used the following drugs: Marijuana.The patient is accompanied by her significant other today. She is divorced and lives at home. She is nulliparous. 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  Allergies: No Known Allergies  Current Medications: Current Outpatient Medications  Medication Sig Dispense Refill   alendronate (FOSAMAX) 70 MG tablet Take 1 tablet (70 mg total) by mouth every 7 (seven) days. Take with a full glass of water on an empty stomach. 12 tablet 3    sertraline (ZOLOFT) 50 MG tablet TAKE 1 TABLET BY MOUTH EVERY DAY (Patient taking differently: Take 50 mg by mouth at bedtime.) 90 tablet 3   tamoxifen (NOLVADEX) 20 MG tablet Take 20 mg by mouth daily.     No current facility-administered medications for this visit.    REVIEW OF SYSTEMS:  Review of Systems  Constitutional: Negative.  Negative for appetite change, chills, fatigue, fever and unexpected weight change.  HENT:  Negative.    Eyes: Negative.   Respiratory: Negative.  Negative for chest tightness, cough, hemoptysis, shortness of breath and wheezing.   Cardiovascular: Negative.  Negative for chest pain, leg swelling and palpitations.  Gastrointestinal: Negative.  Negative for abdominal distention, abdominal pain, blood in stool, constipation, diarrhea, nausea and vomiting.  Endocrine: Negative.   Genitourinary: Negative.  Negative for difficulty urinating, dysuria, frequency and hematuria.   Musculoskeletal:  Negative for arthralgias, back pain, flank pain, gait problem and myalgias.       Occasional pain of the lumpectomy and axillary sites  Skin: Negative.   Neurological: Negative.  Negative for dizziness, extremity weakness, gait problem, headaches, light-headedness, numbness, seizures and speech difficulty.  Hematological: Negative.   Psychiatric/Behavioral: Negative.  Negative for depression and sleep disturbance. The patient is not nervous/anxious.      VITALS:  Blood pressure 102/60, pulse 68, temperature 97.6 F (36.4 C), temperature source Oral, resp. rate 16, height 5' 4" (1.626 m), weight 145 lb 1.6 oz (65.8 kg), SpO2 97 %.  Wt Readings from Last 3 Encounters:  07/19/21 145 lb 1.6 oz (65.8 kg)  06/08/21 145 lb 3.2 oz (65.9 kg)  04/13/21 144 lb 12.8 oz (65.7 kg)    Body mass index is 24.91 kg/m.  Performance status (ECOG): 0 - Asymptomatic  PHYSICAL EXAM:  Physical Exam Constitutional:      General: She is not in acute distress.    Appearance: Normal  appearance. She is normal weight.  HENT:     Head: Normocephalic and atraumatic.  Eyes:     General: No scleral icterus.    Extraocular Movements: Extraocular movements intact.     Conjunctiva/sclera: Conjunctivae normal.     Pupils: Pupils are equal, round, and reactive to light.  Cardiovascular:     Rate and Rhythm: Normal rate and regular rhythm.     Pulses: Normal pulses.     Heart sounds: Normal heart sounds. No murmur heard.   No friction rub. No gallop.  Pulmonary:     Effort: Pulmonary effort is normal. No respiratory distress.     Breath sounds: Normal breath sounds.  Chest:     Comments: Well healed  incision at the lateral left breast at 3 o'clock; there is a slight nodularity. Well healed incision in the left axilla. Small firm ridge measuring 3 cm long at about 4 o'clock in the right breast, 5 cm from the nipple. Abdominal:     General: Bowel sounds are normal. There is no distension.     Palpations: Abdomen is soft. There is no hepatomegaly, splenomegaly or mass.     Tenderness: There is no abdominal tenderness.  Musculoskeletal:        General: Normal range of motion.     Cervical back: Normal range of motion and neck supple.     Right lower leg: No edema.     Left lower leg: No edema.  Lymphadenopathy:     Cervical: No cervical adenopathy.  Skin:    General: Skin is warm and dry.  Neurological:     General: No focal deficit present.     Mental Status: She is alert and oriented to person, place, and time. Mental status is at baseline.  Psychiatric:        Mood and Affect: Mood normal.        Behavior: Behavior normal.        Thought Content: Thought content normal.        Judgment: Judgment normal.     LABS:   CBC Latest Ref Rng & Units 07/19/2021 04/13/2021 12/21/2020  WBC - 4.2 4.1 6.0  Hemoglobin 12.0 - 16.0 13.5 13.2 13.4  Hematocrit 36 - 46 39 40.1 40.8  Platelets 150 - 399 195 182 247   CMP Latest Ref Rng & Units 07/19/2021 04/13/2021 12/21/2020   Glucose 70 - 99 mg/dL - 86 103(H)  BUN 4 - _0 Creatinine 0.5 - 1.1 0.9 1.01(H) 1.04(H)  Sodium 137 - 147 139 141 138  Potassium 3.4 - 5.3 4.0 4.0 4.2  Chloride 99 - 108 106 106 103  CO2 13 - 22 26(A) 27 30  Calcium 8.7 - 10.7 8.6(A) 9.3 10.1  Total Protein 6.5 - 8.1 g/dL - 6.8 6.5  Total Bilirubin 0.3 - 1.2 mg/dL - 0.6 0.8  Alkaline Phos 25 - 125 63 80 69  AST 13 - 35 _1 ALT 7 - 35 _2 STUDIES:  No results found.     I, Rita Ohara, am acting as scribe for Derwood Kaplan, MD  I have reviewed this report as typed by the medical scribe, and it is complete and accurate.

## 2021-07-19 ENCOUNTER — Inpatient Hospital Stay: Payer: Medicare Other | Attending: Oncology | Admitting: Oncology

## 2021-07-19 ENCOUNTER — Other Ambulatory Visit: Payer: Self-pay

## 2021-07-19 ENCOUNTER — Encounter: Payer: Self-pay | Admitting: Oncology

## 2021-07-19 ENCOUNTER — Inpatient Hospital Stay: Payer: Medicare Other

## 2021-07-19 ENCOUNTER — Other Ambulatory Visit: Payer: Self-pay | Admitting: Oncology

## 2021-07-19 VITALS — BP 102/60 | HR 68 | Temp 97.6°F | Resp 16 | Ht 64.0 in | Wt 145.1 lb

## 2021-07-19 DIAGNOSIS — N6314 Unspecified lump in the right breast, lower inner quadrant: Secondary | ICD-10-CM

## 2021-07-19 DIAGNOSIS — Z170 Estrogen receptor positive status [ER+]: Secondary | ICD-10-CM | POA: Diagnosis not present

## 2021-07-19 DIAGNOSIS — C50512 Malignant neoplasm of lower-outer quadrant of left female breast: Secondary | ICD-10-CM

## 2021-07-19 DIAGNOSIS — D649 Anemia, unspecified: Secondary | ICD-10-CM | POA: Diagnosis not present

## 2021-07-19 DIAGNOSIS — L814 Other melanin hyperpigmentation: Secondary | ICD-10-CM | POA: Insufficient documentation

## 2021-07-19 DIAGNOSIS — L578 Other skin changes due to chronic exposure to nonionizing radiation: Secondary | ICD-10-CM | POA: Insufficient documentation

## 2021-07-19 DIAGNOSIS — D2370 Other benign neoplasm of skin of unspecified lower limb, including hip: Secondary | ICD-10-CM | POA: Insufficient documentation

## 2021-07-19 LAB — BASIC METABOLIC PANEL
BUN: 17 (ref 4–21)
CO2: 26 — AB (ref 13–22)
Chloride: 106 (ref 99–108)
Creatinine: 0.9 (ref 0.5–1.1)
Glucose: 108
Potassium: 4 (ref 3.4–5.3)
Sodium: 139 (ref 137–147)

## 2021-07-19 LAB — CBC AND DIFFERENTIAL
HCT: 39 (ref 36–46)
Hemoglobin: 13.5 (ref 12.0–16.0)
Neutrophils Absolute: 2.77
Platelets: 195 (ref 150–399)
WBC: 4.2

## 2021-07-19 LAB — COMPREHENSIVE METABOLIC PANEL
Albumin: 4 (ref 3.5–5.0)
Calcium: 8.6 — AB (ref 8.7–10.7)

## 2021-07-19 LAB — CBC: RBC: 4.35 (ref 3.87–5.11)

## 2021-07-19 LAB — HEPATIC FUNCTION PANEL
ALT: 22 (ref 7–35)
AST: 26 (ref 13–35)
Alkaline Phosphatase: 63 (ref 25–125)
Bilirubin, Total: 0.6

## 2021-07-20 ENCOUNTER — Other Ambulatory Visit: Payer: Self-pay | Admitting: Oncology

## 2021-07-20 DIAGNOSIS — N6314 Unspecified lump in the right breast, lower inner quadrant: Secondary | ICD-10-CM

## 2021-07-30 ENCOUNTER — Ambulatory Visit
Admission: RE | Admit: 2021-07-30 | Discharge: 2021-07-30 | Disposition: A | Discharge status: Home / Self Care | Payer: Medicare Other | Source: Ambulatory Visit | Attending: Oncology | Admitting: Oncology

## 2021-07-30 DIAGNOSIS — R922 Inconclusive mammogram: Secondary | ICD-10-CM | POA: Diagnosis not present

## 2021-07-30 DIAGNOSIS — N6314 Unspecified lump in the right breast, lower inner quadrant: Secondary | ICD-10-CM

## 2021-08-24 DIAGNOSIS — L578 Other skin changes due to chronic exposure to nonionizing radiation: Secondary | ICD-10-CM | POA: Diagnosis not present

## 2021-08-24 DIAGNOSIS — Z23 Encounter for immunization: Secondary | ICD-10-CM | POA: Diagnosis not present

## 2021-08-24 DIAGNOSIS — Z86018 Personal history of other benign neoplasm: Secondary | ICD-10-CM | POA: Diagnosis not present

## 2021-08-24 DIAGNOSIS — D225 Melanocytic nevi of trunk: Secondary | ICD-10-CM | POA: Diagnosis not present

## 2021-08-24 DIAGNOSIS — D2371 Other benign neoplasm of skin of right lower limb, including hip: Secondary | ICD-10-CM | POA: Diagnosis not present

## 2021-08-24 DIAGNOSIS — L814 Other melanin hyperpigmentation: Secondary | ICD-10-CM | POA: Diagnosis not present

## 2021-08-24 DIAGNOSIS — Z411 Encounter for cosmetic surgery: Secondary | ICD-10-CM | POA: Diagnosis not present

## 2021-08-24 DIAGNOSIS — L821 Other seborrheic keratosis: Secondary | ICD-10-CM | POA: Diagnosis not present

## 2021-09-06 DIAGNOSIS — J310 Chronic rhinitis: Secondary | ICD-10-CM | POA: Diagnosis not present

## 2021-09-06 DIAGNOSIS — J3489 Other specified disorders of nose and nasal sinuses: Secondary | ICD-10-CM | POA: Diagnosis not present

## 2021-09-06 DIAGNOSIS — J342 Deviated nasal septum: Secondary | ICD-10-CM | POA: Diagnosis not present

## 2021-09-14 ENCOUNTER — Encounter (HOSPITAL_COMMUNITY): Payer: Self-pay

## 2021-09-22 ENCOUNTER — Telehealth: Payer: Self-pay | Admitting: Family Medicine

## 2021-09-22 NOTE — Telephone Encounter (Signed)
Copied from [place] [id number]. Topic: Medicare AWV ?>> Sep 22, 2021 10:33 AM Harris-Coley, Hannah Beat wrote: ?Reason for CRM: Left message for patient to schedule Annual Wellness Visit.  Please schedule with Nurse Health Advisor Charlott Rakes, RN at [facility].  Please call [phone] ask for Juliann Pulse ?

## 2021-09-30 ENCOUNTER — Ambulatory Visit (INDEPENDENT_AMBULATORY_CARE_PROVIDER_SITE_OTHER): Payer: Medicare Other

## 2021-09-30 DIAGNOSIS — Z0000 Encounter for general adult medical examination without abnormal findings: Secondary | ICD-10-CM | POA: Diagnosis not present

## 2021-09-30 NOTE — Patient Instructions (Addendum)
Katrina Williamson , ?Thank you for taking time to come for your Medicare Wellness Visit. I appreciate your ongoing commitment to your health goals. Please review the following plan we discussed and let me know if I can assist you in the future.  ? ?Screening recommendations/referrals: ?Colonoscopy: Done 03/15/15 repeat every 10 years   ?Mammogram: Done 07/30/21  scheduled for 10/24/21 ?Bone Density: Done 03/28/21 repeat every 2 years  ?Recommended yearly ophthalmology/optometry visit for glaucoma screening and checkup ?Recommended yearly dental visit for hygiene and checkup ? ?Vaccinations: ?Influenza vaccine: Done 05/18/21 repeat every year  ?Pneumococcal vaccine: Due soon 10/04/21 ?Tdap vaccine: Done 03/14/18 repeat every 10 years  ?Shingles vaccine: Completed 9/23, 05/21/19   ?Covid-19:Completed 3/25, 4/15, 05/17/20 & 03/02/21 ? ?Advanced directives: Advance directive discussed with you today. Even though you declined this today please call our office should you change your mind and we can give you the proper paperwork for you to fill out. ? ?Conditions/risks identified: None at this time  ? ?Next appointment: Follow up in one year for your annual wellness visit  ? ? ?Preventive Care 66 Years and Older, Female ?Preventive care refers to lifestyle choices and visits with your health care provider that can promote health and wellness. ?What does preventive care include? ?A yearly physical exam. This is also called an annual well check. ?Dental exams once or twice a year. ?Routine eye exams. Ask your health care provider how often you should have your eyes checked. ?Personal lifestyle choices, including: ?Daily care of your teeth and gums. ?Regular physical activity. ?Eating a healthy diet. ?Avoiding tobacco and drug use. ?Limiting alcohol use. ?Practicing safe sex. ?Taking low-dose aspirin every day. ?Taking vitamin and mineral supplements as recommended by your health care provider. ?What happens during an annual well  check? ?The services and screenings done by your health care provider during your annual well check will depend on your age, overall health, lifestyle risk factors, and family history of disease. ?Counseling  ?Your health care provider may ask you questions about your: ?Alcohol use. ?Tobacco use. ?Drug use. ?Emotional well-being. ?Home and relationship well-being. ?Sexual activity. ?Eating habits. ?History of falls. ?Memory and ability to understand (cognition). ?Work and work Statistician. ?Reproductive health. ?Screening  ?You may have the following tests or measurements: ?Height, weight, and BMI. ?Blood pressure. ?Lipid and cholesterol levels. These may be checked every 5 years, or more frequently if you are over 62 years old. ?Skin check. ?Lung cancer screening. You may have this screening every year starting at age 26 if you have a 30-pack-year history of smoking and currently smoke or have quit within the past 15 years. ?Fecal occult blood test (FOBT) of the stool. You may have this test every year starting at age 9. ?Flexible sigmoidoscopy or colonoscopy. You may have a sigmoidoscopy every 5 years or a colonoscopy every 10 years starting at age 66. ?Hepatitis C blood test. ?Hepatitis B blood test. ?Sexually transmitted disease (STD) testing. ?Diabetes screening. This is done by checking your blood sugar (glucose) after you have not eaten for a while (fasting). You may have this done every 1-3 years. ?Bone density scan. This is done to screen for osteoporosis. You may have this done starting at age 11. ?Mammogram. This may be done every 1-2 years. Talk to your health care provider about how often you should have regular mammograms. ?Talk with your health care provider about your test results, treatment options, and if necessary, the need for more tests. ?Vaccines  ?Your health care  provider may recommend certain vaccines, such as: ?Influenza vaccine. This is recommended every year. ?Tetanus, diphtheria, and  acellular pertussis (Tdap, Td) vaccine. You may need a Td booster every 10 years. ?Zoster vaccine. You may need this after age 65. ?Pneumococcal 13-valent conjugate (PCV13) vaccine. One dose is recommended after age 54. ?Pneumococcal polysaccharide (PPSV23) vaccine. One dose is recommended after age 62. ?Talk to your health care provider about which screenings and vaccines you need and how often you need them. ?This information is not intended to replace advice given to you by your health care provider. Make sure you discuss any questions you have with your health care provider. ?Document Released: 06/18/2015 Document Revised: 02/09/2016 Document Reviewed: 03/23/2015 ?Elsevier Interactive Patient Education ? 2017 [place]. ? ?Fall Prevention in the Home ?Falls can cause injuries. They can happen to people of all ages. There are many things you can do to make your home safe and to help prevent falls. ?What can I do on the outside of my home? ?Regularly fix the edges of walkways and driveways and fix any cracks. ?Remove anything that might make you trip as you walk through a door, such as a raised step or threshold. ?Trim any bushes or trees on the path to your home. ?Use bright outdoor lighting. ?Clear any walking paths of anything that might make someone trip, such as rocks or tools. ?Regularly check to see if handrails are loose or broken. Make sure that both sides of any steps have handrails. ?Any raised decks and porches should have guardrails on the edges. ?Have any leaves, snow, or ice cleared regularly. ?Use sand or salt on walking paths during winter. ?Clean up any spills in your garage right away. This includes oil or grease spills. ?What can I do in the bathroom? ?Use night lights. ?Install grab bars by the toilet and in the tub and shower. Do not use towel bars as grab bars. ?Use non-skid mats or decals in the tub or shower. ?If you need to sit down in the shower, use a plastic, non-slip stool. ?Keep  the floor dry. Clean up any water that spills on the floor as soon as it happens. ?Remove soap buildup in the tub or shower regularly. ?Attach bath mats securely with double-sided non-slip rug tape. ?Do not have throw rugs and other things on the floor that can make you trip. ?What can I do in the bedroom? ?Use night lights. ?Make sure that you have a light by your bed that is easy to reach. ?Do not use any sheets or blankets that are too big for your bed. They should not hang down onto the floor. ?Have a firm chair that has side arms. You can use this for support while you get dressed. ?Do not have throw rugs and other things on the floor that can make you trip. ?What can I do in the kitchen? ?Clean up any spills right away. ?Avoid walking on wet floors. ?Keep items that you use a lot in easy-to-reach places. ?If you need to reach something above you, use a strong step stool that has a grab bar. ?Keep electrical cords out of the way. ?Do not use floor polish or wax that makes floors slippery. If you must use wax, use non-skid floor wax. ?Do not have throw rugs and other things on the floor that can make you trip. ?What can I do with my stairs? ?Do not leave any items on the stairs. ?Make sure that there are handrails on both  sides of the stairs and use them. Fix handrails that are broken or loose. Make sure that handrails are as long as the stairways. ?Check any carpeting to make sure that it is firmly attached to the stairs. Fix any carpet that is loose or worn. ?Avoid having throw rugs at the top or bottom of the stairs. If you do have throw rugs, attach them to the floor with carpet tape. ?Make sure that you have a light switch at the top of the stairs and the bottom of the stairs. If you do not have them, ask someone to add them for you. ?What else can I do to help prevent falls? ?Wear shoes that: ?Do not have high heels. ?Have rubber bottoms. ?Are comfortable and fit you well. ?Are closed at the toe. Do not  wear sandals. ?If you use a stepladder: ?Make sure that it is fully opened. Do not climb a closed stepladder. ?Make sure that both sides of the stepladder are locked into place. ?Ask someone to hold it for you, i

## 2021-09-30 NOTE — Progress Notes (Signed)
Virtual Visit via Telephone Note ? ?I connected with  Katrina Williamson on 09/30/21 at 10:30 AM EDT by telephone and verified that I am speaking with the correct person using two identifiers. ? ?Medicare Annual Wellness visit completed telephonically due to Covid-19 pandemic.  ? ?Persons participating in this call: This Health Coach and this patient.  ? ?Location: ?Patient: Home ?Provider: Office  ?  ?I discussed the limitations, risks, security and privacy concerns of performing an evaluation and management service by telephone and the availability of in person appointments. The patient expressed understanding and agreed to proceed. ? ?Unable to perform video visit due to video visit attempted and failed and/or patient does not have video capability.  ? ?Some vital signs may be absent or patient reported.  ? ?Willette Brace, LPN ? ? ?Subjective:  ? Katrina Williamson is a 66 y.o. female who presents for an Initial Medicare Annual Wellness Visit. ? ?Review of Systems    ? ?Cardiac Risk Factors include: advanced age (>30mn, >>52women) ? ?   ?Objective:  ?  ?There were no vitals filed for this visit. ?There is no height or weight on file to calculate BMI. ? ? ?  09/30/2021  ? 10:20 AM 02/10/2021  ?  1:42 PM 12/09/2020  ?  1:38 PM  ?Advanced Directives  ?Does Patient Have a Medical Advance Directive? No No No  ?Would patient like information on creating a medical advance directive? No - Patient declined No - Patient declined No - Patient declined  ? ? ?Current Medications (verified) ?Outpatient Encounter Medications as of 09/30/2021  ?Medication Sig  ? alendronate (FOSAMAX) 70 MG tablet Take 1 tablet (70 mg total) by mouth every 7 (seven) days. Take with a full glass of water on an empty stomach.  ? sertraline (ZOLOFT) 50 MG tablet TAKE 1 TABLET BY MOUTH EVERY DAY (Patient taking differently: Take 50 mg by mouth at bedtime.)  ? tamoxifen (NOLVADEX) 20 MG tablet Take 20 mg by mouth daily.  ? fluticasone (FLONASE) 50 MCG/ACT  nasal spray Place 2 sprays into both nostrils daily.  ? ?No facility-administered encounter medications on file as of 09/30/2021.  ? ? ?Allergies (verified) ?Patient has no known allergies.  ? ?History: ?Past Medical History:  ?Diagnosis Date  ? Allergies   ? Allergy 1990  ? seasonal  ? Anxiety 2006  ? Asthma   ? minor  ? Breast cancer [facility] 11/17/20  ? Breast cancer in female ([facility]   ? Frequent headaches   ? 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 Osteopenia after menopause 03/14/2018  ? 2011 DEXA, T equals -2.3.  See old records  ? Osteoporosis   ? Postmenopausal HRT (hormone replacement therapy) 03/14/2018  ? Menopause at age 66   ? ?Past Surgical History:  ?Procedure Laterality Date  ? BREAST LUMPECTOMY WITH RADIOACTIVE SEED AND SENTINEL LYMPH NODE BIOPSY Left 12/29/2020  ? Procedure: LEFT BREAST LUMPECTOMY WITH RADIOACTIVE SEED;  Surgeon: BStark Klein MD;  [facility]  Service: General;  Laterality: Left;  ? SENTINEL NODE BIOPSY Left 12/29/2020  ? Procedure: LEFT AXILLARY SENTINEL NODE BIOPSY;  Surgeon: BStark Klein MD;  Location[facility]  Service: General;  Laterality: Left;  ? WISDOM TOOTH EXTRACTION    ? ?Family History  ?Problem Relation Age of Onset  ? Arthritis Mother   ? Hearing loss Mother   ? Osteoporosis Mother   ?     significant  ? Stroke Father   ? Alcohol  abuse Sister   ? Arthritis Sister   ? Cancer Sister   ?     uterine; 60s  ? ?Social History  ? ?Socioeconomic History  ? Marital status: Divorced  ?  Spouse name: Not on file  ? Number of children: Not on file  ? Years of education: Not on file  ? Highest education level: Not on file  ?Occupational History  ? Not on file  ?Tobacco Use  ? Smoking status: Never  ? Smokeless tobacco: Never  ?Vaping Use  ? Vaping Use: Never used  ?Substance and Sexual Activity  ? Alcohol use: Yes  ?  Alcohol/week: 2.0 standard drinks  ?  Types: 1 Glasses of wine, 1 Shots of liquor per week  ? Drug use: Not Currently  ?  Types: Marijuana  ? Sexual  activity: Yes  ?  Birth control/protection: Post-menopausal  ?Other Topics Concern  ? Not on file  ?Social History Narrative  ? Not on file  ? ?Social Determinants of Health  ? ?Financial Resource Strain: Low Risk   ? Difficulty of Paying Living Expenses: Not hard at all  ?Food Insecurity: No Food Insecurity  ? Worried About Charity fundraiser in the Last Year: Never true  ? Ran Out of Food in the Last Year: Never true  ?Transportation Needs: No Transportation Needs  ? Lack of Transportation (Medical): No  ? Lack of Transportation (Non-Medical): No  ?Physical Activity: Sufficiently Active  ? Days of Exercise per Week: 3 days  ? Minutes of Exercise per Session: 60 min  ?Stress: No Stress Concern Present  ? Feeling of Stress : Not at all  ?Social Connections: Moderately Integrated  ? Frequency of Communication with Friends and Family: More than three times a week  ? Frequency of Social Gatherings with Friends and Family: More than three times a week  ? Attends Religious Services: Never  ? Active Member of Clubs or Organizations: Yes  ? Attends Archivist Meetings: 1 to 4 times per year  ? Marital Status: Married  ? ? ?Tobacco Counseling ?Counseling given: Not Answered ? ? ?Clinical Intake: ? ?Pre-visit preparation completed: Yes ? ?Pain : No/denies pain ? ?  ? ?BMI - recorded: 24.91 ?Nutritional Status: BMI of 19-24  Normal ?Nutritional Risks: None ?Diabetes: No ? ?How often do you need to have someone help you when you read instructions, pamphlets, or other written materials from your doctor or pharmacy?: 1 - Never ? ?Diabetic?no ? ?Interpreter Needed?: No ? ?Information entered by :: Charlott Rakes, LPN ? ? ?Activities of Daily Living ? ?  09/30/2021  ? 10:21 AM 12/21/2020  ?  2:08 PM  ?In your present state of health, do you have any difficulty performing the following activities:  ?Hearing? 0   ?Vision? 0   ?Difficulty concentrating or making decisions? 0   ?Walking or climbing stairs? 0   ?Dressing or  bathing? 0   ?Doing errands, shopping? 0 0  ?Preparing Food and eating ? N   ?Using the Toilet? N   ?In the past six months, have you accidently leaked urine? N   ?Do you have problems with loss of bowel control? N   ?Managing your Medications? N   ?Managing your Finances? N   ?Housekeeping or managing your Housekeeping? N   ? ? ?Patient Care Team: ?Leamon Arnt, MD as PCP - General (Family Medicine) ?Magrinat, Virgie Dad, MD (Inactive) as Consulting Physician (Oncology) ?Stark Klein, MD as Consulting Physician (General  Surgery) ?Kyung Rudd, MD as Consulting Physician (Radiation Oncology) ?Haverstock, Jennefer Bravo, MD as Referring Physician (Dermatology) ?Gardenia Phlegm, NP as Nurse Practitioner (Hematology and Oncology) ?Harmon Pier, RN as Registered Nurse ? ?Indicate any recent Medical Services you may have received from other than [facility] providers in the past year (date may be approximate). ? ?   ?Assessment:  ? This is a routine wellness examination for [place]. ? ?Hearing/Vision screen ?Hearing Screening - Comments:: Pt denies any hearing issues  ?Vision Screening - Comments:: Pt follows up with [facility] for annual eye exams  ? ?Dietary issues and exercise activities discussed: ?Current Exercise Habits: Home exercise routine, Type of exercise: Other - see comments, Time (Minutes): 60, Frequency (Times/Week): 3, Weekly Exercise (Minutes/Week): 180 ? ? Goals Addressed   ? ?  ?  ?  ?  ? This Visit's Progress  ?  Patient Stated     ?  None at this Time  ?  ? ?  ? ?Depression Screen ? ?  09/30/2021  ? 10:19 AM 05/06/2021  ?  2:38 PM 10/04/2020  ?  1:00 PM 08/06/2018  ? 11:20 AM 04/25/2018  ? 10:23 AM 03/14/2018  ?  4:22 PM  ?PHQ 2/9 Scores  ?PHQ - 2 Score 0 0 0 0 0 1  ?PHQ- 9 Score     0 10  ?  ?Fall Risk ? ?  09/30/2021  ? 10:21 AM 10/04/2020  ?  1:00 PM 08/06/2018  ? 11:20 AM  ?Fall Risk   ?Falls in the past year? 0 0 0  ?Number falls in past yr: 0 0 0  ?Injury with Fall? 0 0 0  ?Risk for fall due to :  Impaired vision    ?Follow up Falls prevention discussed  Falls evaluation completed  ? ? ?FALL RISK PREVENTION PERTAINING TO THE HOME: ? ?Any stairs in or around the home? No  ?If so, are there any without han

## 2021-10-03 ENCOUNTER — Ambulatory Visit (INDEPENDENT_AMBULATORY_CARE_PROVIDER_SITE_OTHER): Payer: Medicare Other | Admitting: Family Medicine

## 2021-10-03 ENCOUNTER — Encounter: Payer: Self-pay | Admitting: Family Medicine

## 2021-10-03 VITALS — BP 110/66 | HR 59 | Ht 64.0 in | Wt 147.0 lb

## 2021-10-03 DIAGNOSIS — M810 Age-related osteoporosis without current pathological fracture: Secondary | ICD-10-CM

## 2021-10-03 DIAGNOSIS — Z8262 Family history of osteoporosis: Secondary | ICD-10-CM | POA: Diagnosis not present

## 2021-10-03 DIAGNOSIS — F4322 Adjustment disorder with anxiety: Secondary | ICD-10-CM

## 2021-10-03 DIAGNOSIS — C50512 Malignant neoplasm of lower-outer quadrant of left female breast: Secondary | ICD-10-CM

## 2021-10-03 DIAGNOSIS — Z170 Estrogen receptor positive status [ER+]: Secondary | ICD-10-CM

## 2021-10-03 DIAGNOSIS — Z23 Encounter for immunization: Secondary | ICD-10-CM

## 2021-10-03 DIAGNOSIS — E782 Mixed hyperlipidemia: Secondary | ICD-10-CM | POA: Diagnosis not present

## 2021-10-03 LAB — COMPREHENSIVE METABOLIC PANEL
ALT: 16 U/L (ref 0–35)
AST: 21 U/L (ref 0–37)
Albumin: 4.2 g/dL (ref 3.5–5.2)
Alkaline Phosphatase: 57 U/L (ref 39–117)
BUN: 17 mg/dL (ref 6–23)
CO2: 27 mEq/L (ref 19–32)
Calcium: 8.9 mg/dL (ref 8.4–10.5)
Chloride: 105 mEq/L (ref 96–112)
Creatinine, Ser: 1.09 mg/dL (ref 0.40–1.20)
GFR: 53.11 mL/min — ABNORMAL LOW (ref >60.00)
Glucose, Bld: 86 mg/dL (ref 70–99)
Potassium: 3.8 mEq/L (ref 3.5–5.1)
Sodium: 140 mEq/L (ref 135–145)
Total Bilirubin: 0.5 mg/dL (ref 0.2–1.2)
Total Protein: 6.8 g/dL (ref 6.0–8.3)

## 2021-10-03 LAB — LIPID PANEL
Cholesterol: 234 mg/dL — ABNORMAL HIGH (ref 0–200)
HDL: 67.2 mg/dL (ref >39.00)
LDL Cholesterol: 142 mg/dL — ABNORMAL HIGH (ref 0–99)
NonHDL: 166.74
Total CHOL/HDL Ratio: 3
Triglycerides: 123 mg/dL (ref 0.0–149.0)
VLDL: 24.6 mg/dL (ref 0.0–40.0)

## 2021-10-03 LAB — TSH: TSH: 2.29 u[IU]/mL (ref 0.35–5.50)

## 2021-10-03 MED ORDER — SERTRALINE HCL 50 MG PO TABS: 50.0000 mg | ORAL_TABLET | Freq: Every day | ORAL | 3 refills | Status: DC

## 2021-10-03 NOTE — Patient Instructions (Signed)
Please return in 12 months for your annual complete physical; please come fasting.  ? ?I will release your lab results to you on your MyChart account with further instructions. You may see the results before I do, but when I review them I will send you a message with my report or have my assistant call you if things need to be discussed. Please reply to my message with any questions. Thank you!  ? ?Today you were given your Pneuomvax or pneumococcal -23 vaccination.  This completes your pneumonia vaccinations.  ? ?If you have any questions or concerns, please don't hesitate to send me a message via MyChart or call the office at [phone]. Thank you for visiting with [place] today! It's our pleasure caring for you.  ? ? ?

## 2021-10-03 NOTE — Progress Notes (Signed)
?Subjective  ?Chief Complaint  ?Patient presents with  ? Annual Exam  ?  Pt here for Annual Exam and is currently fasting. She also stated that she has a bruise on her Rt foot that has been there for months and she does not know where it came from  ? ? ?HPI: Katrina Williamson is a 66 y.o. female who presents to [facility] [place] today for a Female Wellness Visit. She also has the concerns and/or needs as listed above in the chief complaint. These will be addressed in addition to the Health Maintenance Visit.  ? ?Wellness Visit: annual visit with health maintenance review and exam without Pap ? ?HM: due mammogram this month. Doing well overall. Eligible for pneumovax 23 today. Other screens are current. Was married this weekend. She is very happy.  ?Chronic disease f/u and/or acute problem visit: (deemed necessary to be done in addition to the wellness visit): ?Adjustment disorder with anxiety: She decided to continue sertraline 50 mg nightly.  This is worked well for her.  Request refill.  No adverse effects ?Osteoporosis now on Fosamax 70 mg weekly.  Tolerating well without side effects.  On calcium and vitamin D.  No GI side effects.  Bone density due next year. ?Breast cancer: Reviewed oncology notes.  Reviewed recent lab work.  Remains stable on tamoxifen.  Mammogram this month. ?Hyperlipidemia, mild.  Not yet on medication.  Fasting for recheck today.  Eats well overall. ? ?Assessment  ?1. Adjustment disorder with anxiety   ?2. Osteoporosis without current pathological fracture, unspecified osteoporosis type   ?3. Family history of osteoporosis   ?4. Malignant neoplasm of lower-outer quadrant of left breast of female, estrogen receptor positive ([place])   ?5. Mixed hyperlipidemia   ?6. Need for Streptococcus pneumoniae vaccination   ? ?  ?Plan  ?Female Wellness Visit: ?Age appropriate Health Maintenance and Prevention measures were discussed with patient. Included topics are cancer screening  recommendations, ways to keep healthy (see AVS) including dietary and exercise recommendations, regular eye and dental care, use of seat belts, and avoidance of moderate alcohol use and tobacco use.  Mammogram scheduled ?BMI: discussed patient's BMI and encouraged positive lifestyle modifications to help get to or maintain a target BMI. ?HM needs and immunizations were addressed and ordered. See below for orders. See HM and immunization section for updates.  Pneumovax given today ?Routine labs and screening tests ordered including cmp, cbc and lipids where appropriate. ?Discussed recommendations regarding Vit D and calcium supplementation (see AVS) ? ?Chronic disease management visit and/or acute problem visit: ?Osteoporosis: Continue Fosamax 70 mg weekly. ?Adjustment disorder is very stable on sertraline 50 mg daily.  Refilled. ?Recheck fasting lipids.  May warrant statin if worsens. ?Breast cancer: On tamoxifen for 5 years. ? ?Follow up: Return in about 1 year (around 10/04/2022) for complete physical.  ?Orders Placed This Encounter  ?Procedures  ? Pneumococcal polysaccharide vaccine 23-valent greater than or equal to 2yo subcutaneous/IM  ? Lipid panel  ? TSH  ? Comprehensive metabolic panel  ? ?Meds ordered this encounter  ?Medications  ? sertraline (ZOLOFT) 50 MG tablet  ?  Sig: Take 1 tablet (50 mg total) by mouth at bedtime.  ?  Dispense:  90 tablet  ?  Refill:  3  ? ?  ? ?Body mass index is 8.07 kg/m?. ?Wt Readings from Last 3 Encounters:  ?10/03/21 47 lb (21.3 kg)  ?07/19/21 145 lb 1.6 oz (65.8 kg)  ?06/08/21 145 lb 3.2  oz (65.9 kg)  ? ? ? ?Patient Active Problem List  ? Diagnosis Date Noted  ? Malignant neoplasm of lower-outer quadrant of left breast of female, estrogen receptor positive ([place]) 11/29/2020  ? Seasonal allergic rhinitis due to pollen 11/29/2020  ? 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 Postmenopausal HRT (hormone replacement therapy) 03/14/2018  ?  Menopause at age 80;  ? ?  ? Family  history of osteoporosis 03/14/2018  ?  Mom with severe osteoporosis ? ?  ? Adjustment disorder with anxiety 03/14/2018  ? Osteoporosis 03/14/2018  ?  DEXA: 03/2021: lowest T = -2.7 left forearm,  -2.5 tp -2.6 at femurs. Started fosamax. On tamoxifen ?DEXA: 07/30/2018: lowest T = -2.6, now with osteoporosis. Will offer treatment. 08/2018 declined meds; will increase exercise, collagen, vit D and calcium. Will recheck in 2 years.  ?Dexa 03/2018, stable osteopenia, t = -2.3 lowest. 2011 DEXA, T equals -2.3.  See old records ? ?  ? ?Health Maintenance  ?Topic Date Due  ? MAMMOGRAM  10/21/2021  ? INFLUENZA VACCINE  01/03/2022  ? DEXA SCAN  03/29/2023  ? COLONOSCOPY (Pts 45-79yr Insurance coverage will need to be confirmed)  03/14/2025  ? TETANUS/TDAP  03/14/2028  ? Pneumonia Vaccine 66 Years old  Completed  ? COVID-19 Vaccine  Completed  ? Hepatitis C Screening  Completed  ? Zoster Vaccines- Shingrix  Completed  ? HPV VACCINES  Aged Out  ? ?Immunization History  ?Administered Date(s) Administered  ? Fluad Quad(high Dose 65+) 05/18/2021  ? Influenza-Unspecified 02/25/2018  ? PFIZER(Purple Top)SARS-COV-2 Vaccination 08/28/2019, 09/18/2019, 05/17/2020  ? PPension scheme manager111yr& up 03/02/2021  ? Pneumococcal Conjugate-13 10/04/2020  ? Pneumococcal Polysaccharide-23 10/03/2021  ? Tdap 03/14/2018  ? Zoster Recombinat (Shingrix) 02/26/2019, 05/21/2019  ? ?We updated and reviewed the patient's past history in detail and it is documented below. ?Allergies: ?Patient has No Known Allergies. ?Past Medical History ?Patient  has a past medical history of Allergies, Allergy (1990), Anxiety (2006), Asthma, Breast cancer ([facility](11/17/20), [facility] F[facility], Herpes simplex dermatitis of eyelid (02/17/2019), Osteopenia after menopause (03/14/2018), Osteoporosis, and Postmenopausal HRT (hormone replacement therapy) (03/14/2018). ?Past Surgical History ?Patient  has a past surgical  history that includes Breast lumpectomy with radioactive seed and sentinel lymph node biopsy (Left, 12/29/2020); Sentinel node biopsy (Left, 12/29/2020); and Wisdom tooth extraction. ?Family History: ?Patient family history includes Alcohol abuse in her sister; Arthritis in her mother and sister; Cancer in her sister; Hearing loss in her mother; Osteoporosis in her mother; Stroke in her father. ?Social History:  ?Patient  reports that she has never smoked. She has never used smokeless tobacco. She reports current alcohol use of about 2.0 standard drinks per week. She reports that she does not currently use drugs after having used the following drugs: Marijuana. ? ?Review of Systems: ?Constitutional: negative for fever or malaise ?Ophthalmic: negative for photophobia, double vision or loss of vision ?Cardiovascular: negative for chest pain, dyspnea on exertion, or new LE swelling ?Respiratory: negative for SOB or persistent cough ?Gastrointestinal: negative for abdominal pain, change in bowel habits or melena ?Genitourinary: negative for dysuria or gross hematuria, no abnormal uterine bleeding or disharge ?Musculoskeletal: negative for new gait disturbance or muscular weakness ?Integumentary: negative for new or persistent rashes, no breast lumps ?Neurological: negative for TIA or stroke symptoms ?Psychiatric: negative for SI or delusions ?Allergic/Immunologic: negative for hives ? ?Patient Care Team  ?  Relationship Specialty Notifications Start End  ?AnBilley Chang  L, MD PCP - General Family Medicine  03/14/18   ?Magrinat, Virgie Dad, MD (Inactive) Consulting Physician Oncology  11/29/20   ?Stark Klein, MD Consulting Physician General Surgery  11/29/20   ?Kyung Rudd, MD Consulting Physician Radiation Oncology  11/30/20   ?Haverstock, Jennefer Bravo, MD Referring Physician Dermatology  11/30/20   ?Gardenia Phlegm, NP Nurse Practitioner Hematology and Oncology  05/03/21   ?Harmon Pier, RN Registered Nurse    05/03/21   ?Arelia Sneddon, MD Consulting Physician Otolaryngology  10/03/21   ? ? ?Objective  ?Vitals: BP 110/66   Pulse (!) 59   Ht '5\' 4"'$  (1.626 m)   Wt 47 lb (21.3 kg)   SpO2 96%   BMI 8.07 kg/m?  ?General:

## 2021-10-16 NOTE — Progress Notes (Signed)
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 This was treated with lumpectomy and adjuvant radiation. She was placed on hormonal therapy with Tamoxifen in November 2022, and will continue this for a total of 5 years.  Osteoporosis, for which she started alendronate. She will be due for repeat bone density in October 2024.   This is a pleasant 66 year old female with stage IA hormone receptor positive left breast cancer. She has continued tamoxifen without difficulty and will continue this for a total of 5 years.  We will plan to see her back in 3 months for repeat evaluation. Her annual bilateral mammogram is scheduled for May 22nd at the breast center in [place].  She understands and agrees with this plan of care. I have answered her questions and she knows to call with any concerns.   I provided 15 minutes of face-to-face time during this this encounter and > 50% was spent counseling as documented under my assessment and plan.    Derwood Kaplan, MD [facility] [address] [place] [address] Dept: [phone] Dept Fax: [phone]   CHIEF COMPLAINT:  CC: Stage IA hormone receptor positive left breast cancer  Current Treatment:  Tamoxifen   HISTORY OF PRESENT ILLNESS:  Katrina Williamson is a 66 y.o. female who presents as a transfer of care from Dr. Virgie Dad office for the continued management and treatment of stage IA (T1b N0 M0) hormone receptor positive invasive lobular carcinoma of the left breast, diagnosed in June 2022. This was found on screening mammogram from May 2022 which showed possible abnormality in the left breast. Diagnostic imaging from June confirmed a 0.9  mm mass in the left breast at 3:30 o'clock. No adenopathy was identified. Biopsy was pursued on June 14th and pathology revealed invasive mammary carcinoma, grade 2, and mammary carcinoma in situ. Estrogen receptor was positive at 95% and progesterone receptor was positive at 90%. HER2 was negative 1+. Ki67 was 1%. Bilateral breast MRI from July revealed a biopsy proven malignancy measuring 1.1 cm within the left breast. Right breast was negative. She underwent lumpectomy with Dr. Barry Dienes on July 27th and final pathology from this procedure confirmed invasive lobular carcinoma, grade 2, 1.4 cm, lobular neoplasia. All margins were negative. Two sentinel lymph nodes were negative for carcinoma (0/2). She did have some mild complications after her procedure with opening of the incisions, which required packing. Oncotype revealed a recurrence score of 22, low risk, and predicts a risk of recurrence outside the breast over the next 9 years of 8%, if the patient's only systemic therapy is an antiestrogen for 5 years.  It also predicted  no benefit from chemotherapy. She received adjuvant radiation to the left breast, and was started on adjuvant endocrine therapy with tamoxifen in November 2022, with plans of 5 years of therapy.   INTERVAL HISTORY:  I have reviewed her chart and materials related to her cancer extensively and collaborated history with the patient. Summary of oncologic history is as follows: Oncology History  Malignant neoplasm of lower-outer quadrant of left breast of female, estrogen receptor positive ([place])  11/29/2020 Initial Diagnosis   Malignant neoplasm of lower-outer quadrant of left breast of female, estrogen receptor positive ([place])  11/30/2020 Cancer Staging   Staging form: Breast, AJCC 8th Edition - Clinical: Stage IA (cT1b, cN0, cM0, G2, ER+, PR+, HER2-) - Signed by Chauncey Cruel, MD on 11/30/2020 Histologic grading system: 3 grade system    12/29/2020 Surgery   Left breast  lumpectomy with sentinel lymph node. Two lymph nodes biopsied, both negative for carcinoma.    Radiation Therapy   03/08/2021 through 04/04/2021 Site Technique Total Dose (Gy) Dose per Fx (Gy) Completed Fx Beam Energies  Breast, Left: Breast_Lt 3D 42.56/42.56 2.66 16/16 6X  Breast, Left: Breast_Lt_Bst 3D 8/8 2 4/4 6X     Surgery        Katrina Williamson states that she is doing well and denies complaints. She continues tamoxifen daily without significant difficulty. Her mammogram is already scheduled on May 22nd at the [facility].  She had her labs done earlier this month by her primary care provider.  She does have occasional pain of the left breast/axilla. Bone density in October 2022 revealed osteoporosis and she was recently started on alendronate 70 mg weekly. Her  appetite is good, and she is eating well. She denies any significant unintentional weight loss or gain.  She denies fever, chills or other signs of infection.  She denies nausea, vomiting, bowel issues, or abdominal pain.  She denies sore throat, cough, dyspnea, or chest pain.  She has a family history of severe osteoporosis. She is up to date on colonoscopy as of 2016, and repeat in 5-10 years was recommended.  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  See old records   Osteoporosis    Postmenopausal HRT (hormone replacement therapy) 03/14/2018   Menopause at age 52;     Past Surgical History:  Procedure Laterality Date   BREAST LUMPECTOMY WITH RADIOACTIVE SEED AND SENTINEL LYMPH NODE BIOPSY Left 12/29/2020   Procedure: LEFT BREAST LUMPECTOMY WITH RADIOACTIVE SEED;  Surgeon: Stark Klein, MD;  Location: [facility];  Service: General;  Laterality:  Left;   SENTINEL NODE BIOPSY Left 12/29/2020   Procedure: LEFT AXILLARY SENTINEL NODE BIOPSY;  Surgeon: Stark Klein, MD;  Location: [place];  Service: General;  Laterality: Left;   WISDOM TOOTH EXTRACTION      Family History  Problem Relation Age of Onset   Arthritis Mother    Hearing loss Mother    Osteoporosis Mother        significant   Stroke Father    Alcohol abuse Sister    Arthritis Sister    Cancer Sister        uterine; 72s    Social History:  reports that she has never smoked. She has never used smokeless tobacco. She reports current alcohol use of about 2.0 standard drinks per week. She reports that she does not currently use drugs after having used the following drugs: Marijuana.The patient is accompanied by her significant other today. She is divorced and lives at home. She is nulliparous. 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  Allergies: No Known Allergies  Current Medications: Current Outpatient Medications  Medication Sig Dispense Refill  alendronate (FOSAMAX) 70 MG tablet Take 1 tablet (70 mg total) by mouth every 7 (seven) days. Take with a full glass of water on an empty stomach. 12 tablet 3   fluticasone (FLONASE) 50 MCG/ACT nasal spray Place 2 sprays into both nostrils daily.     sertraline (ZOLOFT) 50 MG tablet Take 1 tablet (50 mg total) by mouth at bedtime. 90 tablet 3   tamoxifen (NOLVADEX) 20 MG tablet Take 1 tablet (20 mg total) by mouth daily. 90 tablet 3   No current facility-administered medications for this visit.    REVIEW OF SYSTEMS:  Review of Systems  Constitutional: Negative.  Negative for appetite change, chills, fatigue, fever and unexpected weight change.  HENT:  Negative.    Eyes: Negative.   Respiratory: Negative.  Negative for chest tightness, cough, hemoptysis, shortness of breath and wheezing.   Cardiovascular: Negative.  Negative for chest pain, leg swelling and  palpitations.  Gastrointestinal: Negative.  Negative for abdominal distention, abdominal pain, blood in stool, constipation, diarrhea, nausea and vomiting.  Endocrine: Negative.   Genitourinary: Negative.  Negative for difficulty urinating, dysuria, frequency and hematuria.   Musculoskeletal:  Negative for arthralgias, back pain, flank pain, gait problem and myalgias.       Occasional pain of the lumpectomy and axillary sites  Skin: Negative.   Neurological: Negative.  Negative for dizziness, extremity weakness, gait problem, headaches, light-headedness, numbness, seizures and speech difficulty.  Hematological: Negative.   Psychiatric/Behavioral: Negative.  Negative for depression and sleep disturbance. The patient is not nervous/anxious.      VITALS:  Blood pressure (!) 111/58, pulse 63, temperature 98.6 F (37 C), resp. rate 14, height '5\' 4"'  (1.626 m), weight 146 lb 3.2 oz (66.3 kg), SpO2 96 %.  Wt Readings from Last 3 Encounters:  10/17/21 146 lb 3.2 oz (66.3 kg)  10/03/21 147 lb (66.7 kg)  07/19/21 145 lb 1.6 oz (65.8 kg)    Body mass index is 25.1 kg/m.  Performance status (ECOG): 0 - Asymptomatic  PHYSICAL EXAM:  Physical Exam Constitutional:      General: She is not in acute distress.    Appearance: Normal appearance. She is normal weight.  HENT:     Head: Normocephalic and atraumatic.  Eyes:     General: No scleral icterus.    Extraocular Movements: Extraocular movements intact.     Conjunctiva/sclera: Conjunctivae normal.     Pupils: Pupils are equal, round, and reactive to light.  Cardiovascular:     Rate and Rhythm: Normal rate and regular rhythm.     Pulses: Normal pulses.     Heart sounds: Normal heart sounds. No murmur heard.   No friction rub. No gallop.  Pulmonary:     Effort: Pulmonary effort is normal. No respiratory distress.     Breath sounds: Normal breath sounds.  Chest:     Comments: Well healed incision at the lateral left breast at 3 o'clock;  there is a slight nodularity. Well healed incision in the left axilla. Abdominal:     General: Bowel sounds are normal. There is no distension.     Palpations: Abdomen is soft. There is no hepatomegaly, splenomegaly or mass.     Tenderness: There is no abdominal tenderness.  Musculoskeletal:        General: Normal range of motion.     Cervical back: Normal range of motion and neck supple.     Right lower leg: No edema.     Left lower leg: No edema.  Lymphadenopathy:     Cervical: No cervical adenopathy.  Skin:    General: Skin is warm and dry.  Neurological:     General: No focal deficit present.     Mental Status: She is alert and oriented to person, place, and time. Mental status is at baseline.  Psychiatric:        Mood and Affect: Mood normal.        Behavior: Behavior normal.        Thought Content: Thought content normal.        Judgment: Judgment normal.     LABS:      Latest Ref Rng & Units 07/19/2021   12:00 AM 04/13/2021   11:17 AM 12/21/2020    2:26 PM  CBC  WBC  4.2   4.1   6.0    Hemoglobin 12.0 - 16.0 13.5   13.2   13.4    Hematocrit 36 - 46 39   40.1   40.8    Platelets 150 - 399 195   182   247        Latest Ref Rng & Units 10/03/2021   10:11 AM 07/19/2021   12:00 AM 04/13/2021   11:17 AM  CMP  Glucose 70 - 99 mg/dL 86    86    BUN 6 - 23 mg/dL '17   17   22    ' Creatinine 0.40 - 1.20 mg/dL 1.09   0.9   1.01    Sodium 135 - 145 mEq/L 140   139   141    Potassium 3.5 - 5.1 mEq/L 3.8   4.0   4.0    Chloride 96 - 112 mEq/L 105   106   106    CO2 19 - 32 mEq/L '27   26   27    ' Calcium 8.4 - 10.5 mg/dL 8.9   8.6   9.3    Total Protein 6.0 - 8.3 g/dL 6.8    6.8    Total Bilirubin 0.2 - 1.2 mg/dL 0.5    0.6    Alkaline Phos 39 - 117 U/L 57   63   80    AST 0 - 37 U/L '21   26   25    ' ALT 0 - 35 U/L '16   22   24      ' STUDIES:  MM DIAG BREAST TOMO BILATERAL  Result Date: 10/24/2021 CLINICAL DATA:  History of left breast cancer status post lumpectomy in July  of 2022. EXAM: DIGITAL DIAGNOSTIC BILATERAL MAMMOGRAM WITH TOMOSYNTHESIS AND CAD TECHNIQUE: Bilateral digital diagnostic mammography and breast tomosynthesis was performed. The images were evaluated with computer-aided detection. COMPARISON:  Previous exam(s). ACR Breast Density Category b: There are scattered areas of fibroglandular density. FINDINGS: Lumpectomy changes are seen in the upper-outer quadrant of the left breast. No suspicious mass or malignant type microcalcifications identified in either breast. IMPRESSION: No evidence of malignancy in either breast. RECOMMENDATION: Bilateral diagnostic mammogram in 1 year is recommended. I have discussed the findings and recommendations with the patient. If applicable, a reminder letter will be sent to the patient regarding the next appointment. BI-RADS CATEGORY  2: Benign. Electronically Signed   By: Lillia Mountain M.D.   On: 10/24/2021 12:22      I, Rita Ohara, am acting as scribe for Derwood Kaplan, MD  I have reviewed this report as typed by the medical scribe, and it is complete and accurate.

## 2021-10-17 ENCOUNTER — Inpatient Hospital Stay: Payer: Medicare Other | Attending: Oncology | Admitting: Oncology

## 2021-10-17 ENCOUNTER — Other Ambulatory Visit: Payer: Self-pay | Admitting: Oncology

## 2021-10-17 VITALS — BP 111/58 | HR 63 | Temp 98.6°F | Resp 14 | Ht 64.0 in | Wt 146.2 lb

## 2021-10-17 DIAGNOSIS — Z170 Estrogen receptor positive status [ER+]: Secondary | ICD-10-CM | POA: Diagnosis not present

## 2021-10-17 DIAGNOSIS — C50512 Malignant neoplasm of lower-outer quadrant of left female breast: Secondary | ICD-10-CM

## 2021-10-17 MED ORDER — TAMOXIFEN CITRATE 20 MG PO TABS: 20.0000 mg | ORAL_TABLET | Freq: Every day | ORAL | 3 refills | Status: DC

## 2021-10-24 ENCOUNTER — Ambulatory Visit
Admission: RE | Admit: 2021-10-24 | Discharge: 2021-10-24 | Disposition: A | Discharge status: Home / Self Care | Payer: Medicare Other | Source: Ambulatory Visit | Attending: Oncology | Admitting: Oncology

## 2021-10-24 DIAGNOSIS — Z853 Personal history of malignant neoplasm of breast: Secondary | ICD-10-CM | POA: Diagnosis not present

## 2021-10-24 DIAGNOSIS — Z9889 Other specified postprocedural states: Secondary | ICD-10-CM

## 2021-11-06 ENCOUNTER — Encounter: Payer: Self-pay | Admitting: Oncology

## 2022-01-16 DIAGNOSIS — H43813 Vitreous degeneration, bilateral: Secondary | ICD-10-CM | POA: Diagnosis not present

## 2022-01-16 DIAGNOSIS — H01005 Unspecified blepharitis left lower eyelid: Secondary | ICD-10-CM | POA: Diagnosis not present

## 2022-01-16 DIAGNOSIS — H01002 Unspecified blepharitis right lower eyelid: Secondary | ICD-10-CM | POA: Diagnosis not present

## 2022-01-16 DIAGNOSIS — H01001 Unspecified blepharitis right upper eyelid: Secondary | ICD-10-CM | POA: Diagnosis not present

## 2022-01-16 DIAGNOSIS — H01004 Unspecified blepharitis left upper eyelid: Secondary | ICD-10-CM | POA: Diagnosis not present

## 2022-01-16 DIAGNOSIS — H04123 Dry eye syndrome of bilateral lacrimal glands: Secondary | ICD-10-CM | POA: Diagnosis not present

## 2022-01-16 DIAGNOSIS — H2513 Age-related nuclear cataract, bilateral: Secondary | ICD-10-CM | POA: Diagnosis not present

## 2022-01-16 DIAGNOSIS — H18593 Other hereditary corneal dystrophies, bilateral: Secondary | ICD-10-CM | POA: Diagnosis not present

## 2022-01-17 ENCOUNTER — Encounter: Payer: Self-pay | Admitting: Oncology

## 2022-01-17 ENCOUNTER — Telehealth: Payer: Self-pay | Admitting: Oncology

## 2022-01-17 ENCOUNTER — Inpatient Hospital Stay: Payer: Medicare Other | Attending: Oncology | Admitting: Oncology

## 2022-01-17 ENCOUNTER — Other Ambulatory Visit: Payer: Self-pay | Admitting: Oncology

## 2022-01-17 VITALS — BP 117/59 | HR 70 | Temp 98.0°F | Resp 16 | Ht 64.0 in | Wt 148.0 lb

## 2022-01-17 DIAGNOSIS — M810 Age-related osteoporosis without current pathological fracture: Secondary | ICD-10-CM

## 2022-01-17 DIAGNOSIS — Z170 Estrogen receptor positive status [ER+]: Secondary | ICD-10-CM

## 2022-01-17 DIAGNOSIS — C50512 Malignant neoplasm of lower-outer quadrant of left female breast: Secondary | ICD-10-CM

## 2022-01-17 NOTE — Progress Notes (Deleted)
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 This was treated with lumpectomy and adjuvant radiation. She was placed on hormonal therapy with Tamoxifen in November 2022, and will continue this for a total of 5 years.  Osteoporosis, for which she started alendronate. She will be due for repeat bone density in October 2024.   She has continued tamoxifen without difficulty and will continue this for a total of 5 years.  We will plan to see her back in 3 months for repeat evaluation with CBC and CMP.  She is already scheduled for her bilateral mammogram in May of 2024. She understands and agrees with this plan of care. I have answered her questions and she knows to call with any concerns.   I provided 15 minutes of face-to-face time during this this encounter and > 50% was spent counseling as documented under my assessment and plan.    Derwood Kaplan, MD [facility] [address] [place] [address] Dept: [phone] Dept Fax: [phone]   CHIEF COMPLAINT:  CC: Stage IA hormone receptor positive left breast cancer  Current Treatment:  Tamoxifen   HISTORY OF PRESENT ILLNESS:  Katrina Williamson is a 66 y.o. female who presents as a transfer of care from Dr. Virgie Dad office for the continued management and treatment of stage IA (T1b N0 M0) hormone receptor positive invasive lobular carcinoma of the left breast, diagnosed in June 2022. This was found on screening mammogram from May 2022 which showed possible abnormality in the left breast. Diagnostic imaging from June confirmed a 0.9 mm mass in the left breast at 3:30 o'clock. No adenopathy was identified. Biopsy was pursued on June  14th and pathology revealed invasive mammary carcinoma, grade 2, and mammary carcinoma in situ. Estrogen receptor was positive at 95% and progesterone receptor was positive at 90%. HER2 was negative 1+. Ki67 was 1%. Bilateral breast MRI from July revealed a biopsy proven malignancy measuring 1.1 cm within the left breast. Right breast was negative. She underwent lumpectomy with Dr. Barry Dienes on July 27th and final pathology from this procedure confirmed invasive lobular carcinoma, grade 2, 1.4 cm, lobular neoplasia. All margins were negative. Two sentinel lymph nodes were negative for carcinoma (0/2). She did have some mild complications after her procedure with opening of the incisions, which required packing. Oncotype revealed a recurrence score of 22, low risk, and predicts a risk of recurrence outside the breast over the next 9 years of 8%, if the patient's only systemic therapy is an antiestrogen for 5 years.  It also predicted  no benefit from chemotherapy. She received adjuvant radiation to the left breast, and was started on adjuvant endocrine therapy with tamoxifen in November 2022, with plans of 5 years of therapy.   INTERVAL HISTORY:  I have reviewed her chart and materials related to her cancer extensively and collaborated history with the patient. Summary of oncologic history is as follows: Oncology History  Malignant neoplasm of lower-outer quadrant of left breast of female, estrogen receptor positive ([facility])  11/29/2020 Initial Diagnosis   Malignant neoplasm of lower-outer quadrant of left breast of female, estrogen receptor positive ([facility])   11/30/2020 Cancer Staging   Staging form: Breast, AJCC 8th Edition - Clinical: Stage  IA (cT1b, cN0, cM0, G2, ER+, PR+, HER2-) - Signed by Chauncey Cruel, MD on 11/30/2020 Histologic grading system: 3 grade system   12/29/2020 Surgery   Left breast lumpectomy with sentinel lymph node. Two lymph nodes biopsied, both negative for carcinoma.    Radiation  Therapy   03/08/2021 through 04/04/2021 Site Technique Total Dose (Gy) Dose per Fx (Gy) Completed Fx Beam Energies  Breast, Left: Breast_Lt 3D 42.56/42.56 2.66 16/16 6X  Breast, Left: Breast_Lt_Bst 3D 8/8 2 4/4 6X     Surgery       Katrina Williamson is here today for a routine follow up of stage 1A breast cancer and states that she is doing well and denies complaints. She reports a smell since taking her medication. I think this may be a rare side effect of the tamoxifen. She continues tamoxifen daily without any other significant difficulty. She completes her labs every year with her PCP in May annually. Her  appetite is good, and she is eating well. She has gained 2 pounds since her last visit.  She denies any significant unintentional weight loss or gain. Her last bilateral mammogram was in May of 2023 and she is already scheduled for her next bilateral mammogram in May of 2024. She denies fever, chills or other signs of infection.  She denies nausea, vomiting, bowel issues, or abdominal pain.  She denies sore throat, cough, dyspnea, or chest pain.     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  See old records   Osteoporosis    Postmenopausal HRT (hormone replacement therapy) 03/14/2018   Menopause at age 69;     Past Surgical History:  Procedure Laterality Date   BREAST LUMPECTOMY WITH RADIOACTIVE SEED AND SENTINEL LYMPH NODE BIOPSY Left 12/29/2020   Procedure: LEFT BREAST LUMPECTOMY WITH RADIOACTIVE SEED;  Surgeon: Stark Klein, MD;  Location: [facility];  Service: General;  Laterality: Left;   SENTINEL NODE BIOPSY Left 12/29/2020   Procedure: LEFT AXILLARY SENTINEL NODE BIOPSY;  Surgeon: Stark Klein, MD;   Location: [facility];  Service: General;  Laterality: Left;   WISDOM TOOTH EXTRACTION      Family History  Problem Relation Age of Onset   Arthritis Mother    Hearing loss Mother    Osteoporosis Mother        significant   Stroke Father    Alcohol abuse Sister    Arthritis Sister    Cancer Sister        uterine; 67s    Social History:  reports that she has never smoked. She has never used smokeless tobacco. She reports current alcohol use of about 2.0 standard drinks of alcohol per week. She reports that she does not currently use drugs after having used the following drugs: Marijuana. She is divorced and lives at home. She is nulliparous. 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  Allergies: No Known Allergies  Current Medications: Current Outpatient Medications  Medication Sig Dispense Refill   alendronate (FOSAMAX) 70 MG tablet Take 1 tablet (70 mg total) by mouth every 7 (seven) days.  Take with a full glass of water on an empty stomach. 12 tablet 3   fluticasone (FLONASE) 50 MCG/ACT nasal spray Place 2 sprays into both nostrils daily.     sertraline (ZOLOFT) 50 MG tablet Take 1 tablet (50 mg total) by mouth at bedtime. 90 tablet 3   tamoxifen (NOLVADEX) 20 MG tablet Take 1 tablet (20 mg total) by mouth daily. 90 tablet 3   No current facility-administered medications for this visit.    REVIEW OF SYSTEMS:  Review of Systems  Constitutional: Negative.  Negative for appetite change, chills, fatigue, fever and unexpected weight change.  HENT:  Negative.    Eyes: Negative.   Respiratory: Negative.  Negative for chest tightness, cough, hemoptysis, shortness of breath and wheezing.   Cardiovascular: Negative.  Negative for chest pain, leg swelling and palpitations.  Gastrointestinal: Negative.  Negative for abdominal distention, abdominal pain, blood in stool, constipation, diarrhea, nausea and vomiting.  Endocrine: Negative.    Genitourinary: Negative.  Negative for difficulty urinating, dysuria, frequency and hematuria.   Musculoskeletal:  Negative for arthralgias, back pain, flank pain, gait problem and myalgias.       Occasional pain of the lumpectomy and axillary sites  Skin: Negative.   Neurological: Negative.  Negative for dizziness, extremity weakness, gait problem, headaches, light-headedness, numbness, seizures and speech difficulty.  Hematological: Negative.   Psychiatric/Behavioral: Negative.  Negative for depression and sleep disturbance. The patient is not nervous/anxious.       VITALS:  Blood pressure (!) 117/59, pulse 70, temperature 98 F (36.7 C), temperature source Oral, resp. rate 16, height 5' 4" (1.626 m), weight 148 lb (67.1 kg), SpO2 96 %.  Wt Readings from Last 3 Encounters:  01/17/22 148 lb (67.1 kg)  10/17/21 146 lb 3.2 oz (66.3 kg)  10/03/21 147 lb (66.7 kg)    Body mass index is 25.4 kg/m.  Performance status (ECOG): 0 - Asymptomatic  PHYSICAL EXAM:  Physical Exam Constitutional:      General: She is not in acute distress.    Appearance: Normal appearance. She is normal weight.  HENT:     Head: Normocephalic and atraumatic.  Eyes:     General: No scleral icterus.    Extraocular Movements: Extraocular movements intact.     Conjunctiva/sclera: Conjunctivae normal.     Pupils: Pupils are equal, round, and reactive to light.  Cardiovascular:     Rate and Rhythm: Normal rate and regular rhythm.     Pulses: Normal pulses.     Heart sounds: Normal heart sounds. No murmur heard.    No friction rub. No gallop.  Pulmonary:     Effort: Pulmonary effort is normal. No respiratory distress.     Breath sounds: Normal breath sounds.  Chest:     Comments: Well healed incision at the lateral left breast at 3 o'clock; there is a slight nodularity. Well healed incision in the left axilla. Abdominal:     General: Bowel sounds are normal. There is no distension.     Palpations: Abdomen  is soft. There is no hepatomegaly, splenomegaly or mass.     Tenderness: There is no abdominal tenderness.  Musculoskeletal:        General: Normal range of motion.     Cervical back: Normal range of motion and neck supple.     Right lower leg: No edema.     Left lower leg: No edema.  Lymphadenopathy:     Cervical: No cervical adenopathy.  Skin:    General:  Skin is warm and dry.  Neurological:     General: No focal deficit present.     Mental Status: She is alert and oriented to person, place, and time. Mental status is at baseline.  Psychiatric:        Mood and Affect: Mood normal.        Behavior: Behavior normal.        Thought Content: Thought content normal.        Judgment: Judgment normal.      LABS:      Latest Ref Rng & Units 07/19/2021   12:00 AM 04/13/2021   11:17 AM 12/21/2020    2:26 PM  CBC  WBC  4.2  4.1  6.0   Hemoglobin 12.0 - 16.0 13.5  13.2  13.4   Hematocrit 36 - 46 39  40.1  40.8   Platelets 150 - 399 195  182  247       Latest Ref Rng & Units 10/03/2021   10:11 AM 07/19/2021   12:00 AM 04/13/2021   11:17 AM  CMP  Glucose 70 - 99 mg/dL 86   86   BUN 6 - 23 mg/dL _0 Creatinine 0.40 - 1.20 mg/dL 1.09  0.9  1.01   Sodium 135 - 145 mEq/L 140  139  141   Potassium 3.5 - 5.1 mEq/L 3.8  4.0  4.0   Chloride 96 - 112 mEq/L 105  106  106   CO2 19 - 32 mEq/L _1 Calcium 8.4 - 10.5 mg/dL 8.9  8.6  9.3   Total Protein 6.0 - 8.3 g/dL 6.8   6.8   Total Bilirubin 0.2 - 1.2 mg/dL 0.5   0.6   Alkaline Phos 39 - 117 U/L 57  63  80   AST 0 - 37 U/L _2 ALT 0 - 35 U/L _3 STUDIES:  No results found.  EXAM:10/24/2021 DIGITAL DIAGNOSTIC BILATERLA MAMMOGRAM   FINDINGS: Lumpectomy changes are seen in the upper-outer quadrant of the left breast. No suspicious mass or malignant type microcalcifications identified in either breast.   IMPRESSION: No evidence of malignancy in either breast.   RECOMMENDATION: Bilateral  diagnostic mammogram in 1 year is recommended.   I have discussed the findings and recommendations with the patient. If applicable, a reminder letter will be sent to the patient regarding the next appointment.   BI-RADS CATEGORY  2: Benign.   I  I,Gabriella Ballesteros,acting as a scribe for Derwood Kaplan, MD.,have documented all relevant documentation on the behalf of Derwood Kaplan, MD,as directed by  Derwood Kaplan, MD while in the presence of Derwood Kaplan, MD.   I have reviewed this report as typed by the medical scribe, and it is complete and accurate.

## 2022-01-17 NOTE — Telephone Encounter (Signed)
Per 01/17/22 los next appt scheduled and confirmed with patient

## 2022-02-11 NOTE — Progress Notes (Signed)
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 This was treated with lumpectomy and adjuvant radiation. She was placed on hormonal therapy with Tamoxifen in November 2022, and will continue this for a total of 5 years.  Osteoporosis, for which she started alendronate. She will be due for repeat bone density in October 2024.   She has continued tamoxifen without difficulty and will continue this for a total of 5 years.  We will plan to see her back in 3 months for repeat evaluation with CBC and CMP.  She is already scheduled for her bilateral mammogram in May of 2024. She understands and agrees with this plan of care. I have answered her questions and she knows to call with any concerns.   I provided 15 minutes of face-to-face time during this this encounter and > 50% was spent counseling as documented under my assessment and plan.    Katrina Kaplan, MD [facility] [address] [place] [address] Dept: [phone] Dept Fax: [phone]   CHIEF COMPLAINT:  CC: Stage IA hormone receptor positive left breast cancer  Current Treatment:  Tamoxifen   HISTORY OF PRESENT ILLNESS:  Katrina Williamson is a 66 y.o. female who presents as a transfer of care from Dr. Virgie Dad office for the continued management and treatment of stage IA (T1b N0 M0) hormone receptor positive invasive lobular carcinoma of the left breast, diagnosed in June 2022. This was found on screening mammogram from May 2022 which showed possible abnormality in the left breast. Diagnostic imaging from June confirmed a 0.9 mm mass in the left breast at 3:30 o'clock. No adenopathy was identified. Biopsy was pursued on June  14th and pathology revealed invasive mammary carcinoma, grade 2, and mammary carcinoma in situ. Estrogen receptor was positive at 95% and progesterone receptor was positive at 90%. HER2 was negative 1+. Ki67 was 1%. Bilateral breast MRI from July revealed a biopsy proven malignancy measuring 1.1 cm within the left breast. Right breast was negative. She underwent lumpectomy with Dr. Barry Dienes on July 27th and final pathology from this procedure confirmed invasive lobular carcinoma, grade 2, 1.4 cm, lobular neoplasia. All margins were negative. Two sentinel lymph nodes were negative for carcinoma (0/2). She did have some mild complications after her procedure with opening of the incisions, which required packing. Oncotype revealed a recurrence score of 22, low risk, and predicts a risk of recurrence outside the breast over the next 9 years of 8%, if the patient's only systemic therapy is an antiestrogen for 5 years.  It also predicted  no benefit from chemotherapy. She received adjuvant radiation to the left breast, and was started on adjuvant endocrine therapy with tamoxifen in November 2022, with plans of 5 years of therapy.   INTERVAL HISTORY:  I have reviewed her chart and materials related to her cancer extensively and collaborated history with the patient. Summary of oncologic history is as follows: Oncology History  Malignant neoplasm of lower-outer quadrant of left breast of female, estrogen receptor positive ([facility])  11/29/2020 Initial Diagnosis   Malignant neoplasm of lower-outer quadrant of left breast of female, estrogen receptor positive ([facility])   11/30/2020 Cancer Staging   Staging form: Breast, AJCC 8th Edition - Clinical: Stage  IA (cT1b, cN0, cM0, G2, ER+, PR+, HER2-) - Signed by Chauncey Cruel, MD on 11/30/2020 Histologic grading system: 3 grade system   12/29/2020 Surgery   Left breast lumpectomy with sentinel lymph node. Two lymph nodes biopsied, both negative for carcinoma.    Radiation  Therapy   03/08/2021 through 04/04/2021 Site Technique Total Dose (Gy) Dose per Fx (Gy) Completed Fx Beam Energies  Breast, Left: Breast_Lt 3D 42.56/42.56 2.66 16/16 6X  Breast, Left: Breast_Lt_Bst 3D 8/8 2 4/4 6X     Surgery       Katrina Williamson is here today for a routine follow up of stage 1A breast cancer and states that she is doing well and denies complaints. She reports a smell since taking her medication. I think this may be a rare side effect of the tamoxifen. She continues tamoxifen daily without any other significant difficulty. She completes her labs every year with her PCP in May annually. Her  appetite is good, and she is eating well. She has gained 2 pounds since her last visit.  She denies any significant unintentional weight loss or gain. Her last bilateral mammogram was in May of 2023 and she is already scheduled for her next bilateral mammogram in May of 2024. She denies fever, chills or other signs of infection.  She denies nausea, vomiting, bowel issues, or abdominal pain.  She denies sore throat, cough, dyspnea, or chest pain.     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  See old records   Osteoporosis    Postmenopausal HRT (hormone replacement therapy) 03/14/2018   Menopause at age 69;     Past Surgical History:  Procedure Laterality Date   BREAST LUMPECTOMY WITH RADIOACTIVE SEED AND SENTINEL LYMPH NODE BIOPSY Left 12/29/2020   Procedure: LEFT BREAST LUMPECTOMY WITH RADIOACTIVE SEED;  Surgeon: Stark Klein, MD;  Location: [facility];  Service: General;  Laterality: Left;   SENTINEL NODE BIOPSY Left 12/29/2020   Procedure: LEFT AXILLARY SENTINEL NODE BIOPSY;  Surgeon: Stark Klein, MD;   Location: [facility];  Service: General;  Laterality: Left;   WISDOM TOOTH EXTRACTION      Family History  Problem Relation Age of Onset   Arthritis Mother    Hearing loss Mother    Osteoporosis Mother        significant   Stroke Father    Alcohol abuse Sister    Arthritis Sister    Cancer Sister        uterine; 67s    Social History:  reports that she has never smoked. She has never used smokeless tobacco. She reports current alcohol use of about 2.0 standard drinks of alcohol per week. She reports that she does not currently use drugs after having used the following drugs: Marijuana. She is divorced and lives at home. She is nulliparous. 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  Allergies: No Known Allergies  Current Medications: Current Outpatient Medications  Medication Sig Dispense Refill   alendronate (FOSAMAX) 70 MG tablet Take 1 tablet (70 mg total) by mouth every 7 (seven) days.  Take with a full glass of water on an empty stomach. 12 tablet 3   fluticasone (FLONASE) 50 MCG/ACT nasal spray Place 2 sprays into both nostrils daily.     sertraline (ZOLOFT) 50 MG tablet Take 1 tablet (50 mg total) by mouth at bedtime. 90 tablet 3   tamoxifen (NOLVADEX) 20 MG tablet Take 1 tablet (20 mg total) by mouth daily. 90 tablet 3   No current facility-administered medications for this visit.    REVIEW OF SYSTEMS:  Review of Systems  Constitutional: Negative.  Negative for appetite change, chills, fatigue, fever and unexpected weight change.  HENT:  Negative.    Eyes: Negative.   Respiratory: Negative.  Negative for chest tightness, cough, hemoptysis, shortness of breath and wheezing.   Cardiovascular: Negative.  Negative for chest pain, leg swelling and palpitations.  Gastrointestinal: Negative.  Negative for abdominal distention, abdominal pain, blood in stool, constipation, diarrhea, nausea and vomiting.  Endocrine: Negative.    Genitourinary: Negative.  Negative for difficulty urinating, dysuria, frequency and hematuria.   Musculoskeletal:  Negative for arthralgias, back pain, flank pain, gait problem and myalgias.       Occasional pain of the lumpectomy and axillary sites  Skin: Negative.   Neurological: Negative.  Negative for dizziness, extremity weakness, gait problem, headaches, light-headedness, numbness, seizures and speech difficulty.  Hematological: Negative.   Psychiatric/Behavioral: Negative.  Negative for depression and sleep disturbance. The patient is not nervous/anxious.       VITALS:  Blood pressure (!) 117/59, pulse 70, temperature 98 F (36.7 C), temperature source Oral, resp. rate 16, height 5' 4" (1.626 m), weight 148 lb (67.1 kg), SpO2 96 %.  Wt Readings from Last 3 Encounters:  01/17/22 148 lb (67.1 kg)  10/17/21 146 lb 3.2 oz (66.3 kg)  10/03/21 147 lb (66.7 kg)    Body mass index is 25.4 kg/m.  Performance status (ECOG): 0 - Asymptomatic  PHYSICAL EXAM:  Physical Exam Constitutional:      General: She is not in acute distress.    Appearance: Normal appearance. She is normal weight.  HENT:     Head: Normocephalic and atraumatic.  Eyes:     General: No scleral icterus.    Extraocular Movements: Extraocular movements intact.     Conjunctiva/sclera: Conjunctivae normal.     Pupils: Pupils are equal, round, and reactive to light.  Cardiovascular:     Rate and Rhythm: Normal rate and regular rhythm.     Pulses: Normal pulses.     Heart sounds: Normal heart sounds. No murmur heard.    No friction rub. No gallop.  Pulmonary:     Effort: Pulmonary effort is normal. No respiratory distress.     Breath sounds: Normal breath sounds.  Chest:     Comments: Well healed incision at the lateral left breast at 3 o'clock; there is a slight nodularity. Well healed incision in the left axilla. Abdominal:     General: Bowel sounds are normal. There is no distension.     Palpations: Abdomen  is soft. There is no hepatomegaly, splenomegaly or mass.     Tenderness: There is no abdominal tenderness.  Musculoskeletal:        General: Normal range of motion.     Cervical back: Normal range of motion and neck supple.     Right lower leg: No edema.     Left lower leg: No edema.  Lymphadenopathy:     Cervical: No cervical adenopathy.  Skin:    General:  Skin is warm and dry.  Neurological:     General: No focal deficit present.     Mental Status: She is alert and oriented to person, place, and time. Mental status is at baseline.  Psychiatric:        Mood and Affect: Mood normal.        Behavior: Behavior normal.        Thought Content: Thought content normal.        Judgment: Judgment normal.      LABS:      Latest Ref Rng & Units 07/19/2021   12:00 AM 04/13/2021   11:17 AM 12/21/2020    2:26 PM  CBC  WBC  4.2  4.1  6.0   Hemoglobin 12.0 - 16.0 13.5  13.2  13.4   Hematocrit 36 - 46 39  40.1  40.8   Platelets 150 - 399 195  182  247       Latest Ref Rng & Units 10/03/2021   10:11 AM 07/19/2021   12:00 AM 04/13/2021   11:17 AM  CMP  Glucose 70 - 99 mg/dL 86   86   BUN 6 - 23 mg/dL $Remove'17  17  22   'rVPvkbb$ Creatinine 0.40 - 1.20 mg/dL 1.09  0.9  1.01   Sodium 135 - 145 mEq/L 140  139  141   Potassium 3.5 - 5.1 mEq/L 3.8  4.0  4.0   Chloride 96 - 112 mEq/L 105  106  106   CO2 19 - 32 mEq/L $Remove'27  26  27   'TXQGeTm$ Calcium 8.4 - 10.5 mg/dL 8.9  8.6  9.3   Total Protein 6.0 - 8.3 g/dL 6.8   6.8   Total Bilirubin 0.2 - 1.2 mg/dL 0.5   0.6   Alkaline Phos 39 - 117 U/L 57  63  80   AST 0 - 37 U/L $Remo'21  26  25   'cgJWi$ ALT 0 - 35 U/L $Remo'16  22  24     'SvHNZ$ STUDIES:  No results found.  EXAM:10/24/2021 DIGITAL DIAGNOSTIC BILATERLA MAMMOGRAM   FINDINGS: Lumpectomy changes are seen in the upper-outer quadrant of the left breast. No suspicious mass or malignant type microcalcifications identified in either breast.   IMPRESSION: No evidence of malignancy in either breast.   RECOMMENDATION: Bilateral  diagnostic mammogram in 1 year is recommended.   I have discussed the findings and recommendations with the patient. If applicable, a reminder letter will be sent to the patient regarding the next appointment.   BI-RADS CATEGORY  2: Benign.    I,Gabriella Ballesteros,acting as a scribe for Katrina Kaplan, MD.,have documented all relevant documentation on the behalf of Katrina Kaplan, MD,as directed by  Katrina Kaplan, MD while in the presence of Katrina Kaplan, MD.   I have reviewed this report as typed by the medical scribe, and it is complete and accurate.

## 2022-02-27 ENCOUNTER — Encounter: Payer: Self-pay | Admitting: *Deleted

## 2022-03-29 DIAGNOSIS — Z23 Encounter for immunization: Secondary | ICD-10-CM | POA: Diagnosis not present

## 2022-03-30 ENCOUNTER — Encounter: Payer: Self-pay | Admitting: Family Medicine

## 2022-04-19 ENCOUNTER — Other Ambulatory Visit: Payer: Medicare Other

## 2022-04-19 ENCOUNTER — Ambulatory Visit: Payer: Medicare Other | Admitting: Oncology

## 2022-04-25 ENCOUNTER — Telehealth: Payer: Self-pay | Admitting: Oncology

## 2022-04-25 ENCOUNTER — Encounter: Payer: Self-pay | Admitting: Oncology

## 2022-04-25 ENCOUNTER — Inpatient Hospital Stay: Payer: Medicare Other | Attending: Oncology

## 2022-04-25 ENCOUNTER — Other Ambulatory Visit: Payer: Self-pay

## 2022-04-25 ENCOUNTER — Inpatient Hospital Stay (INDEPENDENT_AMBULATORY_CARE_PROVIDER_SITE_OTHER): Payer: Medicare Other | Admitting: Oncology

## 2022-04-25 VITALS — BP 132/59 | HR 59 | Temp 98.0°F | Resp 18 | Ht 64.0 in | Wt 151.8 lb

## 2022-04-25 DIAGNOSIS — Z170 Estrogen receptor positive status [ER+]: Secondary | ICD-10-CM | POA: Insufficient documentation

## 2022-04-25 DIAGNOSIS — C50512 Malignant neoplasm of lower-outer quadrant of left female breast: Secondary | ICD-10-CM | POA: Insufficient documentation

## 2022-04-25 DIAGNOSIS — M810 Age-related osteoporosis without current pathological fracture: Secondary | ICD-10-CM

## 2022-04-25 DIAGNOSIS — Z923 Personal history of irradiation: Secondary | ICD-10-CM | POA: Diagnosis not present

## 2022-04-25 DIAGNOSIS — Z79811 Long term (current) use of aromatase inhibitors: Secondary | ICD-10-CM | POA: Insufficient documentation

## 2022-04-25 LAB — CMP (CANCER CENTER ONLY)
ALT: 15 U/L (ref 0–44)
AST: 18 U/L (ref 15–41)
Albumin: 3.9 g/dL (ref 3.5–5.0)
Alkaline Phosphatase: 44 U/L (ref 38–126)
Anion gap: 8 (ref 5–15)
BUN: 16 mg/dL (ref 8–23)
CO2: 26 mmol/L (ref 22–32)
Calcium: 9.1 mg/dL (ref 8.9–10.3)
Chloride: 106 mmol/L (ref 98–111)
Creatinine: 0.99 mg/dL (ref 0.44–1.00)
GFR, Estimated: >60 mL/min (ref >60)
Glucose, Bld: 95 mg/dL (ref 70–99)
Potassium: 4.3 mmol/L (ref 3.5–5.1)
Sodium: 140 mmol/L (ref 135–145)
Total Bilirubin: 0.8 mg/dL (ref 0.3–1.2)
Total Protein: 6.9 g/dL (ref 6.5–8.1)

## 2022-04-25 LAB — CBC WITH DIFFERENTIAL (CANCER CENTER ONLY)
Abs Immature Granulocytes: 0.03 10*3/uL (ref 0.00–0.07)
Basophils Absolute: 0 10*3/uL (ref 0.0–0.1)
Basophils Relative: 1 %
Eosinophils Absolute: 0.2 10*3/uL (ref 0.0–0.5)
Eosinophils Relative: 5 %
HCT: 39.2 % (ref 36.0–46.0)
Hemoglobin: 12.7 g/dL (ref 12.0–15.0)
Immature Granulocytes: 1 %
Lymphocytes Relative: 22 %
Lymphs Abs: 1.1 10*3/uL (ref 0.7–4.0)
MCH: 30.1 pg (ref 26.0–34.0)
MCHC: 32.4 g/dL (ref 30.0–36.0)
MCV: 92.9 fL (ref 80.0–100.0)
Monocytes Absolute: 0.5 10*3/uL (ref 0.1–1.0)
Monocytes Relative: 10 %
Neutro Abs: 3 10*3/uL (ref 1.7–7.7)
Neutrophils Relative %: 61 %
Platelet Count: 194 10*3/uL (ref 150–400)
RBC: 4.22 MIL/uL (ref 3.87–5.11)
RDW: 12.5 % (ref 11.5–15.5)
WBC Count: 4.9 10*3/uL (ref 4.0–10.5)
nRBC: 0 % (ref 0.0–0.2)

## 2022-04-25 NOTE — Progress Notes (Signed)
[facility]  [address] 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 This was treated with lumpectomy and adjuvant radiation. She was placed on hormonal therapy with Tamoxifen in November 2022, and will continue this for a total of 5 years.  Osteoporosis, for which she started alendronate. She will be due for repeat bone density in October 2024.   Plan: She has continued tamoxifen without difficulty and will continue this for a total of 5 years.  We will plan to see her back in 4 months for repeat evaluation.  She is already scheduled for her bilateral mammogram in May of 2024. She understands and agrees with this plan of care. I have answered her questions and she knows to call with any concerns.   I provided 15 minutes of face-to-face time during this this encounter and > 50% was spent counseling as documented under my assessment and plan.    Derwood Kaplan, MD [facility] [address] [place] [address] Dept: [phone] Dept Fax: [phone]   CHIEF COMPLAINT:  CC: Stage IA hormone receptor positive left breast cancer  Current Treatment:  Tamoxifen   HISTORY OF PRESENT ILLNESS:  Katrina Williamson is a 66 y.o. female who presents as a transfer of care from Dr. Virgie Dad office for the continued management and treatment of stage IA (T1b N0 M0) hormone receptor positive invasive lobular carcinoma of the left breast, diagnosed in June 2022. This was found on screening mammogram from May 2022 which showed possible abnormality in the left breast. Diagnostic imaging from June confirmed a 0.9 mm mass in the left breast at 3:30 o'clock. No adenopathy was identified. Biopsy was pursued on June 14th and  pathology revealed invasive mammary carcinoma, grade 2, and mammary carcinoma in situ. Estrogen receptor was positive at 95% and progesterone receptor was positive at 90%. HER2 was negative 1+. Ki67 was 1%. Bilateral breast MRI from July revealed a biopsy proven malignancy measuring 1.1 cm within the left breast. Right breast was negative. She underwent lumpectomy with Dr. Barry Dienes on July 27th and final pathology from this procedure confirmed invasive lobular carcinoma, grade 2, 1.4 cm, lobular neoplasia. All margins were negative. Two sentinel lymph nodes were negative for carcinoma (0/2). She did have some mild complications after her procedure with opening of the incisions, which required packing. Oncotype revealed a recurrence score of 22, low risk, and predicts a risk of recurrence outside the breast over the next 9 years of 8%, if the patient's only systemic therapy is an antiestrogen for 5 years.  It also predicted  no benefit from chemotherapy. She received adjuvant radiation to the left breast, and was started on adjuvant endocrine therapy with tamoxifen in November 2022, with plans of 5 years of therapy.   INTERVAL HISTORY:  I have reviewed her chart and materials related to her cancer extensively and collaborated history with the patient. Summary of oncologic history is as follows: Oncology History  Malignant neoplasm of lower-outer quadrant of left breast of female, estrogen receptor positive ([place])  11/29/2020 Initial Diagnosis   Malignant neoplasm of lower-outer quadrant of left breast of female, estrogen receptor positive ([facility])   11/30/2020 Cancer Staging   Staging form: Breast, AJCC 8th Edition - Clinical: Stage IA (cT1b,  cN0, cM0, G2, ER+, PR+, HER2-) - Signed by Chauncey Cruel, MD on 11/30/2020 Histologic grading system: 3 grade system   12/29/2020 Surgery   Left breast lumpectomy with sentinel lymph node. Two lymph nodes biopsied, both negative for carcinoma.    Radiation Therapy    03/08/2021 through 04/04/2021 Site Technique Total Dose (Gy) Dose per Fx (Gy) Completed Fx Beam Energies  Breast, Left: Breast_Lt 3D 42.56/42.56 2.66 16/16 6X  Breast, Left: Breast_Lt_Bst 3D 8/8 2 4/4 6X     Surgery       Katrina Williamson is here today for a routine follow up of stage 1A breast cancer and states that she is doing well and denies complaints. She continues tamoxifen daily without any other significant difficulty. She notes when her cat steps on her breast it feels sore compared to the other breast but no other complaints. Her  appetite is good, and she is eating well. She has gained 3 pounds since her last visit.  She denies any significant unintentional weight loss or gain. Her last bilateral mammogram was in May of 2023 and she is already scheduled for her next bilateral mammogram in May of 2024. She completes her labs every year with her PCP in May annually. She denies fever, chills or other signs of infection.  She denies nausea, vomiting, bowel issues, or abdominal pain.  She denies sore throat, cough, dyspnea, or chest pain.     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  See old records   Osteoporosis    Postmenopausal HRT (hormone replacement therapy) 03/14/2018   Menopause at age 58;     Past Surgical History:  Procedure Laterality Date   BREAST LUMPECTOMY WITH RADIOACTIVE SEED AND SENTINEL LYMPH NODE BIOPSY Left 12/29/2020   Procedure: LEFT BREAST LUMPECTOMY WITH RADIOACTIVE SEED;  Surgeon: Stark Klein, MD;  Location: [facility];  Service: General;  Laterality: Left;   SENTINEL NODE BIOPSY Left 12/29/2020   Procedure: LEFT AXILLARY SENTINEL NODE BIOPSY;  Surgeon: Stark Klein, MD;  Location: [facility];  Service: General;  Laterality: Left;   WISDOM TOOTH EXTRACTION      Family History  Problem Relation Age of Onset   Arthritis Mother    Hearing loss Mother    Osteoporosis Mother        significant   Stroke Father    Alcohol abuse Sister    Arthritis Sister    Cancer Sister        uterine; 103s    Social History:  reports that she has never smoked. She has never used smokeless tobacco. She reports current alcohol use of about 2.0 standard drinks of alcohol per week. She reports that she does not currently use drugs after having used the following drugs: Marijuana. She is divorced and lives at home. She is nulliparous. 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  Allergies: No Known Allergies  Current Medications: Current Outpatient Medications  Medication Sig Dispense Refill   alendronate (FOSAMAX) 70 MG tablet Take 1 tablet (70 mg total) by mouth every 7 (seven) days. Take  with a full glass of water on an empty stomach. 12 tablet 3   fluticasone (FLONASE) 50 MCG/ACT nasal spray Place 2 sprays into both nostrils daily.     sertraline (ZOLOFT) 50 MG tablet Take 1 tablet (50 mg total) by mouth at bedtime. 90 tablet 3   tamoxifen (NOLVADEX) 20 MG tablet Take 1 tablet (20 mg total) by mouth daily. 90 tablet 3   No current facility-administered medications for this visit.    REVIEW OF SYSTEMS:  Review of Systems  Constitutional: Negative.  Negative for appetite change, chills, fatigue, fever and unexpected weight change.  HENT:  Negative.    Eyes: Negative.   Respiratory: Negative.  Negative for chest tightness, cough, hemoptysis, shortness of breath and wheezing.   Cardiovascular: Negative.  Negative for chest pain, leg swelling and palpitations.  Gastrointestinal: Negative.  Negative for abdominal distention, abdominal pain, blood in stool, constipation, diarrhea, nausea and vomiting.  Endocrine: Negative.   Genitourinary:  Negative.  Negative for difficulty urinating, dysuria, frequency and hematuria.   Musculoskeletal:  Negative for arthralgias, back pain, flank pain, gait problem and myalgias.       Occasional pain of the lumpectomy and axillary sites  Skin: Negative.   Neurological: Negative.  Negative for dizziness, extremity weakness, gait problem, headaches, light-headedness, numbness, seizures and speech difficulty.  Hematological: Negative.   Psychiatric/Behavioral: Negative.  Negative for depression and sleep disturbance. The patient is not nervous/anxious.       VITALS:  Blood pressure (!) 117/59, pulse 70, temperature 98 F (36.7 C), temperature source Oral, resp. rate 16, height _0  (1.626 m), weight 148 lb (67.1 kg), SpO2 96 %.  Wt Readings from Last 3 Encounters:  01/17/22 148 lb (67.1 kg)  10/17/21 146 lb 3.2 oz (66.3 kg)  10/03/21 147 lb (66.7 kg)    Body mass index is 25.4 kg/m.  Performance status (ECOG): 0 - Asymptomatic  PHYSICAL EXAM:  Physical Exam Constitutional:      General: She is not in acute distress.    Appearance: Normal appearance. She is normal weight.  HENT:     Head: Normocephalic and atraumatic.  Eyes:     General: No scleral icterus.    Extraocular Movements: Extraocular movements intact.     Conjunctiva/sclera: Conjunctivae normal.     Pupils: Pupils are equal, round, and reactive to light.  Cardiovascular:     Rate and Rhythm: Normal rate and regular rhythm.     Pulses: Normal pulses.     Heart sounds: Normal heart sounds. No murmur heard.    No friction rub. No gallop.  Pulmonary:     Effort: Pulmonary effort is normal. No respiratory distress.     Breath sounds: Normal breath sounds.  Chest:     Comments:  Well healed scar in the lateral left breast and the left axilla. No masses in either breast.  Abdominal:     General: Bowel sounds are normal. There is no distension.     Palpations: Abdomen is soft. There is no hepatomegaly, splenomegaly or  mass.     Tenderness: There is no abdominal tenderness.  Musculoskeletal:        General: Normal range of motion.     Cervical back: Normal range of motion and neck supple.     Right lower leg: No edema.     Left lower leg: No edema.  Lymphadenopathy:     Cervical: No cervical adenopathy.  Skin:    General: Skin is warm and dry.  Neurological:     General: No focal deficit present.     Mental Status: She is alert and oriented to person, place, and time. Mental status is at baseline.  Psychiatric:        Mood and Affect: Mood normal.        Behavior: Behavior normal.        Thought Content: Thought content normal.        Judgment: Judgment normal.      LABS:      Latest Ref Rng & Units 07/19/2021   12:00 AM 04/13/2021   11:17 AM 12/21/2020    2:26 PM  CBC  WBC  4.2  4.1  6.0   Hemoglobin 12.0 - 16.0 13.5  13.2  13.4   Hematocrit 36 - 46 39  40.1  40.8   Platelets 150 - 399 195  182  247       Latest Ref Rng & Units 10/03/2021   10:11 AM 07/19/2021   12:00 AM 04/13/2021   11:17 AM  CMP  Glucose 70 - 99 mg/dL 86   86   BUN 6 - 23 mg/dL _0 Creatinine 0.40 - 1.20 mg/dL 1.09  0.9  1.01   Sodium 135 - 145 mEq/L 140  139  141   Potassium 3.5 - 5.1 mEq/L 3.8  4.0  4.0   Chloride 96 - 112 mEq/L 105  106  106   CO2 19 - 32 mEq/L _1 Calcium 8.4 - 10.5 mg/dL 8.9  8.6  9.3   Total Protein 6.0 - 8.3 g/dL 6.8   6.8   Total Bilirubin 0.2 - 1.2 mg/dL 0.5   0.6   Alkaline Phos 39 - 117 U/L 57  63  80   AST 0 - 37 U/L _2 ALT 0 - 35 U/L _3 STUDIES:  No results found.  EXAM:10/24/2021 DIGITAL DIAGNOSTIC BILATERLA MAMMOGRAM   FINDINGS: Lumpectomy changes are seen in the upper-outer quadrant of the left breast. No suspicious mass or malignant type microcalcifications identified in either breast.   IMPRESSION: No evidence of malignancy in either breast.   RECOMMENDATION: Bilateral diagnostic mammogram in 1 year is recommended.   I have  discussed the findings and recommendations with the patient. If applicable, a reminder letter will be sent to the patient regarding the next appointment.   BI-RADS CATEGORY  2: Benign.    I,Gabriella Ballesteros,acting as a scribe for Derwood Kaplan, MD.,have documented all relevant documentation on the behalf of Derwood Kaplan, MD,as directed by  Derwood Kaplan, MD while in the presence of Derwood Kaplan, MD.   I have reviewed this report as typed by the medical scribe, and it is complete and accurate.

## 2022-04-25 NOTE — Telephone Encounter (Signed)
Patient has been scheduled for follow-up visit per 04/25/22 los. Pt given an appt calendar with date and time.

## 2022-05-02 ENCOUNTER — Telehealth: Payer: Self-pay

## 2022-05-02 NOTE — Telephone Encounter (Signed)
-----   Message from Derwood Kaplan, MD sent at 05/02/2022  8:50 AM EST ----- Regarding: call Tell her labs look good

## 2022-05-03 ENCOUNTER — Other Ambulatory Visit: Payer: Self-pay | Admitting: Family Medicine

## 2022-05-03 ENCOUNTER — Other Ambulatory Visit (HOSPITAL_COMMUNITY): Payer: Self-pay

## 2022-05-03 MED ORDER — FLUTICASONE PROPIONATE 50 MCG/ACT NA SUSP
2.0000 | Freq: Every day | NASAL | 0 refills | Status: DC
  Filled 2022-05-03: qty 16, 30d supply, fill #0

## 2022-05-04 ENCOUNTER — Telehealth: Payer: Self-pay

## 2022-05-04 NOTE — Telephone Encounter (Signed)
-----   Message from Derwood Kaplan, MD sent at 05/02/2022  8:50 AM EST ----- Regarding: call Tell her labs look good

## 2022-05-04 NOTE — Telephone Encounter (Signed)
Patient notified

## 2022-05-04 NOTE — Telephone Encounter (Signed)
Left VM to return my call

## 2022-05-05 ENCOUNTER — Other Ambulatory Visit (HOSPITAL_COMMUNITY): Payer: Self-pay

## 2022-05-10 ENCOUNTER — Other Ambulatory Visit (HOSPITAL_COMMUNITY): Payer: Self-pay

## 2022-05-10 ENCOUNTER — Encounter (HOSPITAL_COMMUNITY): Payer: Self-pay

## 2022-05-11 ENCOUNTER — Other Ambulatory Visit (HOSPITAL_COMMUNITY): Payer: Self-pay

## 2022-05-29 ENCOUNTER — Other Ambulatory Visit: Payer: Self-pay | Admitting: Family Medicine

## 2022-06-27 DIAGNOSIS — H9202 Otalgia, left ear: Secondary | ICD-10-CM | POA: Diagnosis not present

## 2022-06-29 ENCOUNTER — Encounter: Payer: Self-pay | Admitting: Family Medicine

## 2022-06-29 ENCOUNTER — Ambulatory Visit (INDEPENDENT_AMBULATORY_CARE_PROVIDER_SITE_OTHER): Payer: Medicare Other | Admitting: Family Medicine

## 2022-06-29 VITALS — BP 106/67 | HR 62 | Temp 97.9°F | Ht 64.0 in | Wt 152.6 lb

## 2022-06-29 DIAGNOSIS — H6993 Unspecified Eustachian tube disorder, bilateral: Secondary | ICD-10-CM | POA: Diagnosis not present

## 2022-06-29 DIAGNOSIS — S39012A Strain of muscle, fascia and tendon of lower back, initial encounter: Secondary | ICD-10-CM | POA: Diagnosis not present

## 2022-06-29 DIAGNOSIS — J069 Acute upper respiratory infection, unspecified: Secondary | ICD-10-CM

## 2022-06-29 MED ORDER — TRIAMCINOLONE ACETONIDE 0.1 % EX CREA: TOPICAL_CREAM | CUTANEOUS | 0 refills | Status: DC

## 2022-06-29 NOTE — Patient Instructions (Signed)
Please follow up as scheduled for your next visit with me: 10/05/2022   If you have any questions or concerns, please don't hesitate to send me a message via MyChart or call the office at [phone]. Thank you for visiting with [place] today! It's our pleasure caring for you.   Start oral zyrtec or allegra (generics are fine) once nightly for several weeks.  Add a decongestant like sudafed or dayquil as well. Continue flonase.   Advil and stretching for your low back pain   Eustachian Tube Dysfunction  Eustachian tube dysfunction refers to a condition in which a blockage develops in the narrow passage that connects the middle ear to the back of the nose (eustachian tube). The eustachian tube regulates air pressure in the middle ear by letting air move between the ear and nose. It also helps to drain fluid from the middle ear space. Eustachian tube dysfunction can affect one or both ears. When the eustachian tube does not function properly, air pressure, fluid, or both can build up in the middle ear. What are the causes? This condition occurs when the eustachian tube becomes blocked or cannot open normally. Common causes of this condition include: Ear infections. Colds and other infections that affect the nose, mouth, and throat (upper respiratory tract). Allergies. Irritation from cigarette smoke. Irritation from stomach acid coming up into the esophagus (gastroesophageal reflux). The esophagus is the part of the body that moves food from the mouth to the stomach. Sudden changes in air pressure, such as from descending in an airplane or scuba diving. Abnormal growths in the nose or throat, such as: Growths that line the nose (nasal polyps). Abnormal growth of cells (tumors). Enlarged tissue at the back of the throat (adenoids). What increases the risk? You are more likely to develop this condition if: You smoke. You are overweight. You are a child who has: Certain birth defects of the mouth,  such as cleft palate. Large tonsils or adenoids. What are the signs or symptoms? Common symptoms of this condition include: A feeling of fullness in the ear. Ear pain. Clicking or popping noises in the ear. Ringing in the ear (tinnitus). Hearing loss. Loss of balance. Dizziness. Symptoms may get worse when the air pressure around you changes, such as when you travel to an area of high elevation, fly on an airplane, or go scuba diving. How is this diagnosed? This condition may be diagnosed based on: Your symptoms. A physical exam of your ears, nose, and throat. Tests, such as those that measure: The movement of your eardrum. Your hearing (audiometry). How is this treated? Treatment depends on the cause and severity of your condition. In mild cases, you may relieve your symptoms by moving air into your ears. This is called "popping the ears." In more severe cases, or if you have symptoms of fluid in your ears, treatment may include: Medicines to relieve congestion (decongestants). Medicines that treat allergies (antihistamines). Nasal sprays or ear drops that contain medicines that reduce swelling (steroids). A procedure to drain the fluid in your eardrum. In this procedure, a small tube may be placed in the eardrum to: Drain the fluid. Restore the air in the middle ear space. A procedure to insert a balloon device through the nose to inflate the opening of the eustachian tube (balloon dilation). Follow these instructions at home: Lifestyle Do not do any of the following until your health care provider approves: Travel to high altitudes. Fly in airplanes. Work in a Pension scheme manager or  room. Scuba dive. Do not use any products that contain nicotine or tobacco. These products include cigarettes, chewing tobacco, and vaping devices, such as e-cigarettes. If you need help quitting, ask your health care provider. Keep your ears dry. Wear fitted earplugs during showering and bathing.  Dry your ears completely after. General instructions Take over-the-counter and prescription medicines only as told by your health care provider. Use techniques to help pop your ears as recommended by your health care provider. These may include: Chewing gum. Yawning. Frequent, forceful swallowing. Closing your mouth, holding your nose closed, and gently blowing as if you are trying to blow air out of your nose. Keep all follow-up visits. This is important. Contact a health care provider if: Your symptoms do not go away after treatment. Your symptoms come back after treatment. You are unable to pop your ears. You have: A fever. Pain in your ear. Pain in your head or neck. Fluid draining from your ear. Your hearing suddenly changes. You become very dizzy. You lose your balance. Get help right away if: You have a sudden, severe increase in any of your symptoms. Summary Eustachian tube dysfunction refers to a condition in which a blockage develops in the eustachian tube. It can be caused by ear infections, allergies, inhaled irritants, or abnormal growths in the nose or throat. Symptoms may include ear pain or fullness, hearing loss, or ringing in the ears. Mild cases are treated with techniques to unblock the ears, such as yawning or chewing gum. More severe cases are treated with medicines or procedures. This information is not intended to replace advice given to you by your health care provider. Make sure you discuss any questions you have with your health care provider. Document Revised: 08/02/2020 Document Reviewed: 08/02/2020 Elsevier Patient Education  [employer].

## 2022-06-29 NOTE — Progress Notes (Signed)
Subjective   CC:  Chief Complaint  Patient presents with   Otitis Media    Pt states sx started 1x week ago, and pt states its getting better, pt states that both ears feel as if infected but mainly right ear    HPI: Katrina Williamson is a 67 y.o. female who presents to the office today to address the problems listed above in the chief complaint. Patient complains of typical URI symptoms including nasal congestion, mild sore throat, cough, and mild malaise.  The symptoms have been present for about a week. She sought care at an urgent care 2 days ago. Still with ear complaints and mild nasal congestion: fullness in ears, itching, squishing sound in ears, PND and internal itching of the ears. . She denies high fever or productive cough, shortness of breath or significant GI symptoms.  Over-the-counter cold medicines have been helpful. 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 Remains sore and tight but no radicular sxs. Typically manages chronic back pain with stretching.    Assessment  1. Viral upper respiratory tract infection   2. ETD (Eustachian tube dysfunction), bilateral   3. Strain of lumbar region, initial encounter      Plan  URI, viral, improved with serous effusions and ETD: discussed dx; no sign or sx of bacterial infection is present. Treat supportively with antihistamines, decongestants, and/or cough meds. See AVS for care instructions.  Lumbar strain: heat, stretching and advil. Mild.   Follow up: may for cpe   No orders of the defined types were placed in this encounter.  Meds ordered this encounter  Medications   triamcinolone cream (KENALOG) 0.1 %    Sig: Apply small amount to ear canal on Q-tip daily for up to 2 weeks, then as needed.    Dispense:  45 g    Refill:  0      I reviewed the patients updated PMH, FH, and SocHx.    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   fluticasone (FLONASE) 50 MCG/ACT nasal spray Place 2 sprays into both nostrils daily.   sertraline (ZOLOFT) 50 MG tablet Take 1 tablet (50 mg total) by mouth at bedtime.   tamoxifen (NOLVADEX) 20 MG tablet Take 1 tablet (20 mg total) by mouth daily.   triamcinolone cream (KENALOG) 0.1 % Apply small amount to ear canal on Q-tip daily for up to 2 weeks, then as needed.    Review of Systems: Constitutional: Negative for fever malaise or anorexia Cardiovascular: negative for chest pain Respiratory: negative for SOB or pleuritic chest pain Gastrointestinal: negative for abdominal pain  Objective  Vitals: BP 106/67 (BP Location: Left Arm, Patient Position: Sitting)   Pulse 62   Temp 97.9 F (  36.6 C) (Temporal)   Ht '5\' 4"'$  (1.626 m)   Wt 152 lb 9.6 oz (69.2 kg)   SpO2 98%   BMI 26.19 kg/m  General: no acute respiratory distress , moves easily to exam table.  Psych:  Alert and oriented, normal mood and affect HEENT: Normocephalic, nasal congestion present, TMs w/o erythema but serous effusions present bilaterally,  OP with erythema w/o exudate, + cervical LAD, supple neck  Cardiovascular:  RRR without murmur or gallop. no peripheral edema Respiratory:  Good breath sounds bilaterally, CTAB with normal respiratory effort Back w/o ttp  and neg seated SLR bilaterally Commons side effects, risks, benefits, and alternatives for medications and treatment plan prescribed today were discussed, and the patient expressed understanding of the given instructions. Patient is instructed to call or message via MyChart if he/she has any questions or concerns regarding our treatment plan. No barriers to understanding were identified. We discussed Red Flag symptoms and signs in detail. Patient expressed understanding regarding what to do in case of urgent or emergency type symptoms.  Medication list was reconciled, printed and provided to the patient in AVS. Patient instructions and summary information was reviewed with the patient as documented in the AVS. This note was prepared with assistance of Dragon voice recognition software. Occasional wrong-word or sound-a-like substitutions may have occurred due to the inherent limitations of voice recognition software

## 2022-07-18 ENCOUNTER — Encounter (HOSPITAL_COMMUNITY): Payer: Self-pay

## 2022-07-18 ENCOUNTER — Other Ambulatory Visit: Payer: Self-pay | Admitting: Family Medicine

## 2022-07-18 ENCOUNTER — Other Ambulatory Visit (HOSPITAL_COMMUNITY): Payer: Self-pay

## 2022-07-18 MED ORDER — FLUTICASONE PROPIONATE 50 MCG/ACT NA SUSP
2.0000 | Freq: Every day | NASAL | 0 refills | Status: AC
  Filled 2022-07-18 – 2022-07-21 (×2): qty 16, 30d supply, fill #0

## 2022-07-20 ENCOUNTER — Other Ambulatory Visit (HOSPITAL_COMMUNITY): Payer: Self-pay

## 2022-07-21 ENCOUNTER — Other Ambulatory Visit (HOSPITAL_COMMUNITY): Payer: Self-pay

## 2022-07-26 ENCOUNTER — Other Ambulatory Visit (HOSPITAL_COMMUNITY): Payer: Self-pay

## 2022-07-31 IMAGING — US US BREAST*R* LIMITED INC AXILLA
1 series · 2 of 2 positions shown · non-contrast
Comparison: Previous exam(s).

CLINICAL DATA: Palpable lump at 4 o'clock in the right breast felt
by the patient's physician. The patient does not feel a lump.

EXAM:
DIGITAL DIAGNOSTIC UNILATERAL RIGHT MAMMOGRAM WITH TOMOSYNTHESIS AND
CAD; ULTRASOUND RIGHT BREAST LIMITED
TECHNIQUE: Right digital diagnostic mammography and breast tomosynthesis was
performed. The images were evaluated with computer-aided detection.;
Targeted ultrasound examination of the right breast was performed

[Series 1: us breast*right* limited inc axilla · 0.06mm/px · 2 of 2 slices shown]
[im 1/2]
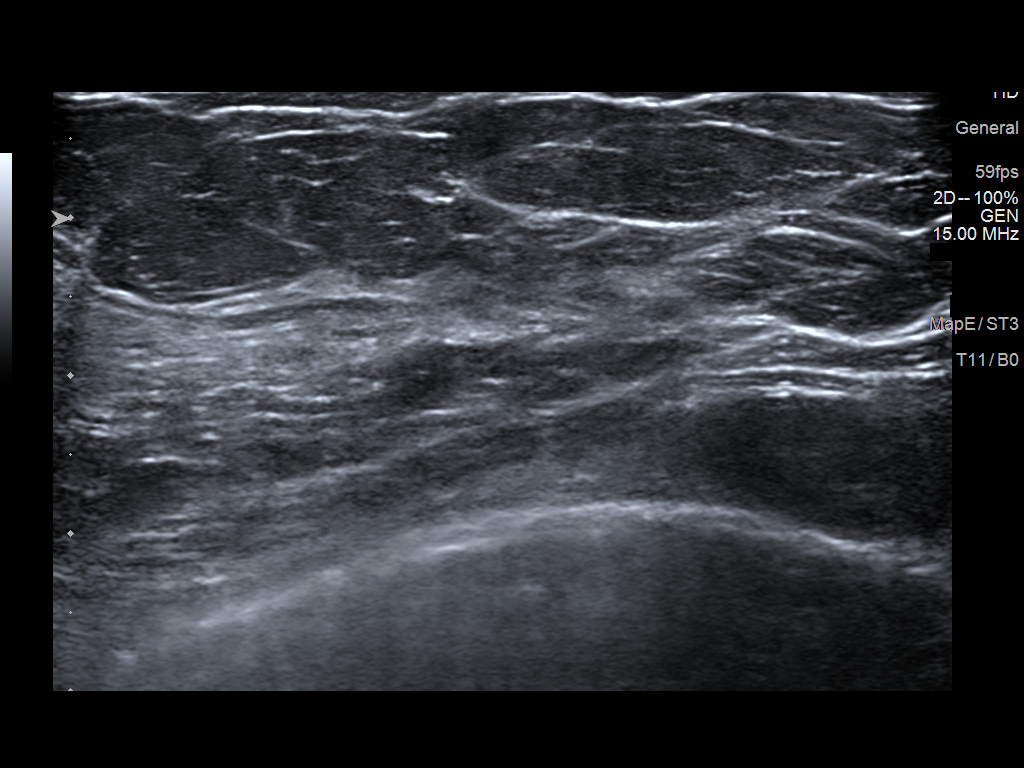
[im 2/2]
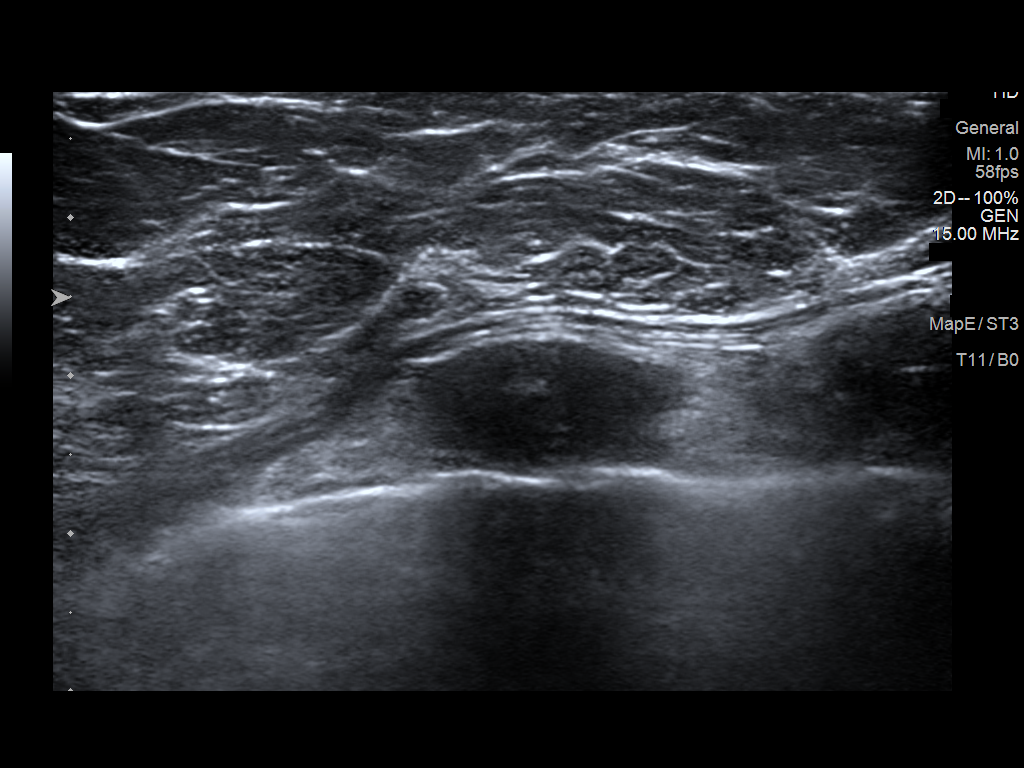

[2 of 2 positions shown; findings below may reference images not displayed]

ACR Breast Density Category b: There are scattered areas of
fibroglandular density.
FINDINGS: No suspicious masses, calcifications, or distortion identified in
the right breast.

Targeted ultrasound is performed, showing no sonographic correlate
to the lump felt at 4 o'clock by the patient's physician.
IMPRESSION: No mammographic or sonographic evidence of malignancy.

RECOMMENDATION:
Treatment of the lump should be based on clinical and physical exam
given lack of imaging findings. Recommend annual screening
mammography.

I have discussed the findings and recommendations with the patient.
If applicable, a reminder letter will be sent to the patient
regarding the next appointment.

BI-RADS CATEGORY  1: Negative.

## 2022-07-31 IMAGING — MG MM DIGITAL DIAGNOSTIC UNILAT*R* W/ TOMO W/ CAD
4 series · 4 of 12 positions shown · non-contrast
Comparison: Previous exam(s).

CLINICAL DATA: Palpable lump at 4 o'clock in the right breast felt
by the patient's physician. The patient does not feel a lump.

EXAM:
DIGITAL DIAGNOSTIC UNILATERAL RIGHT MAMMOGRAM WITH TOMOSYNTHESIS AND
CAD; ULTRASOUND RIGHT BREAST LIMITED
TECHNIQUE: Right digital diagnostic mammography and breast tomosynthesis was
performed. The images were evaluated with computer-aided detection.;
Targeted ultrasound examination of the right breast was performed

[R CC synth-2D]
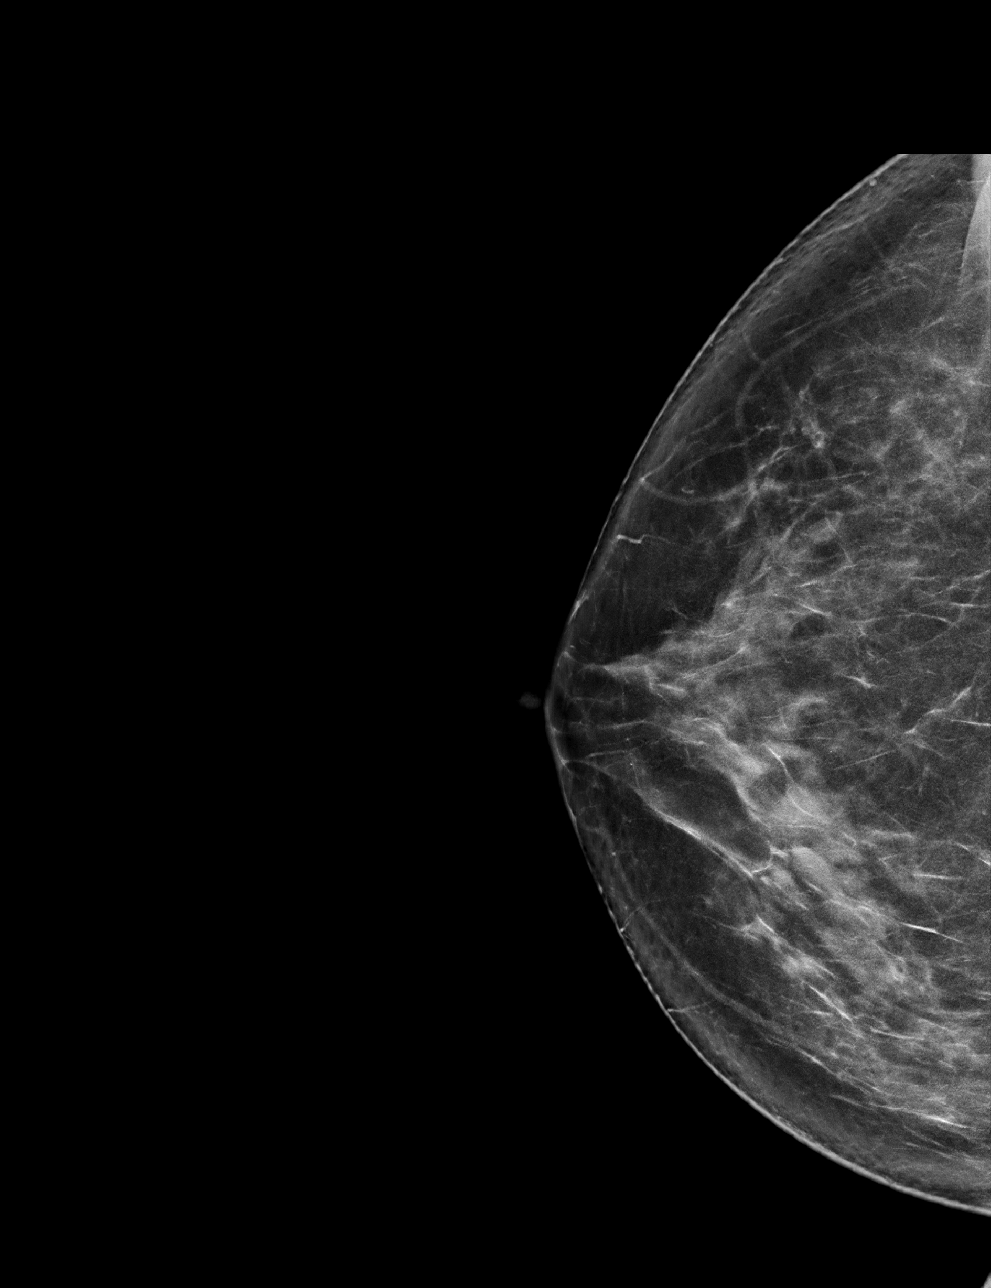

[R MLO synth-2D]
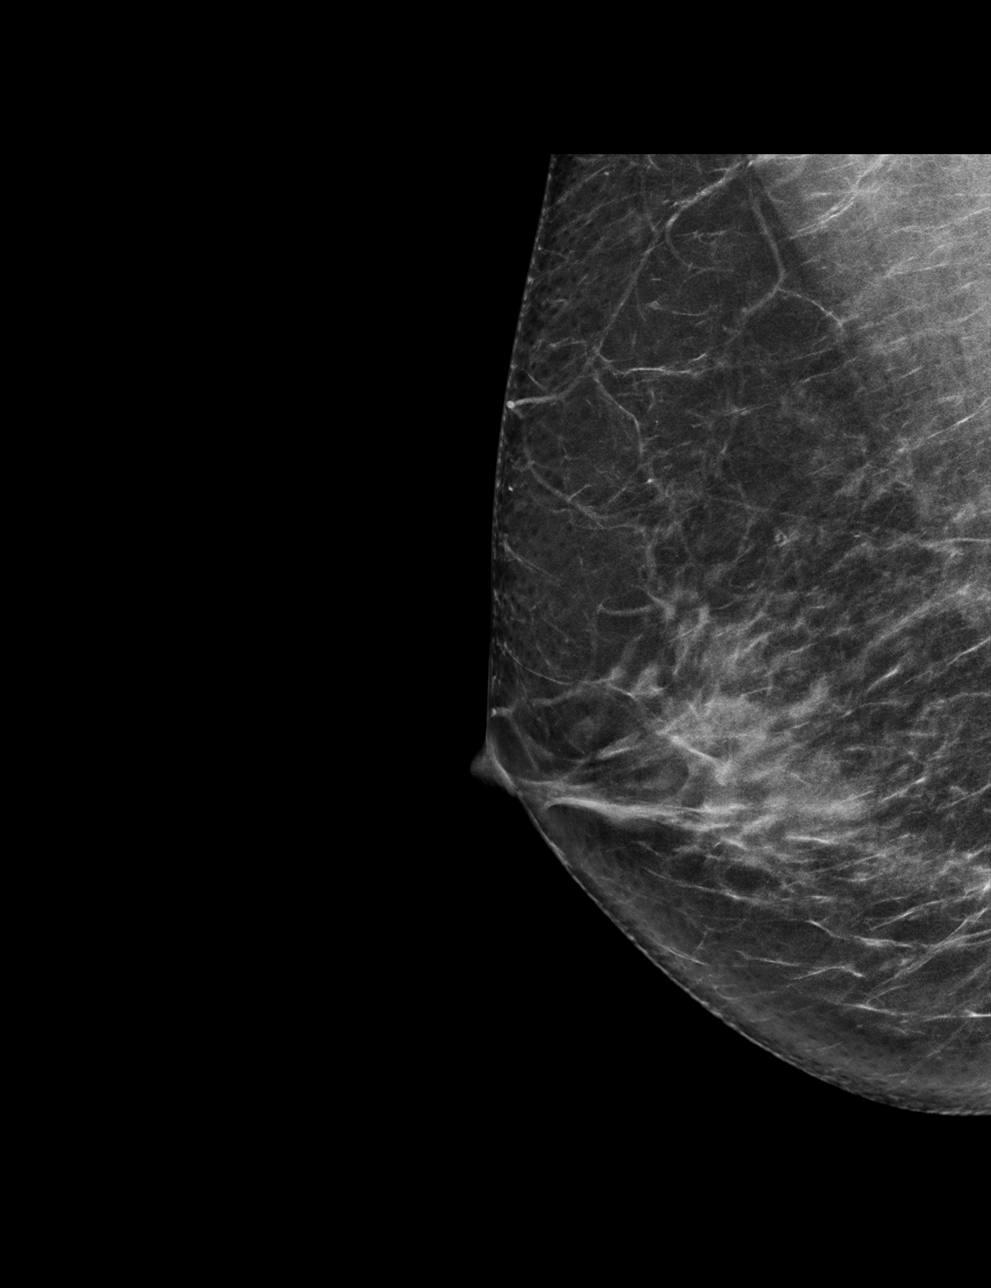

[R CC tomo · tomo slice 37/73.0]
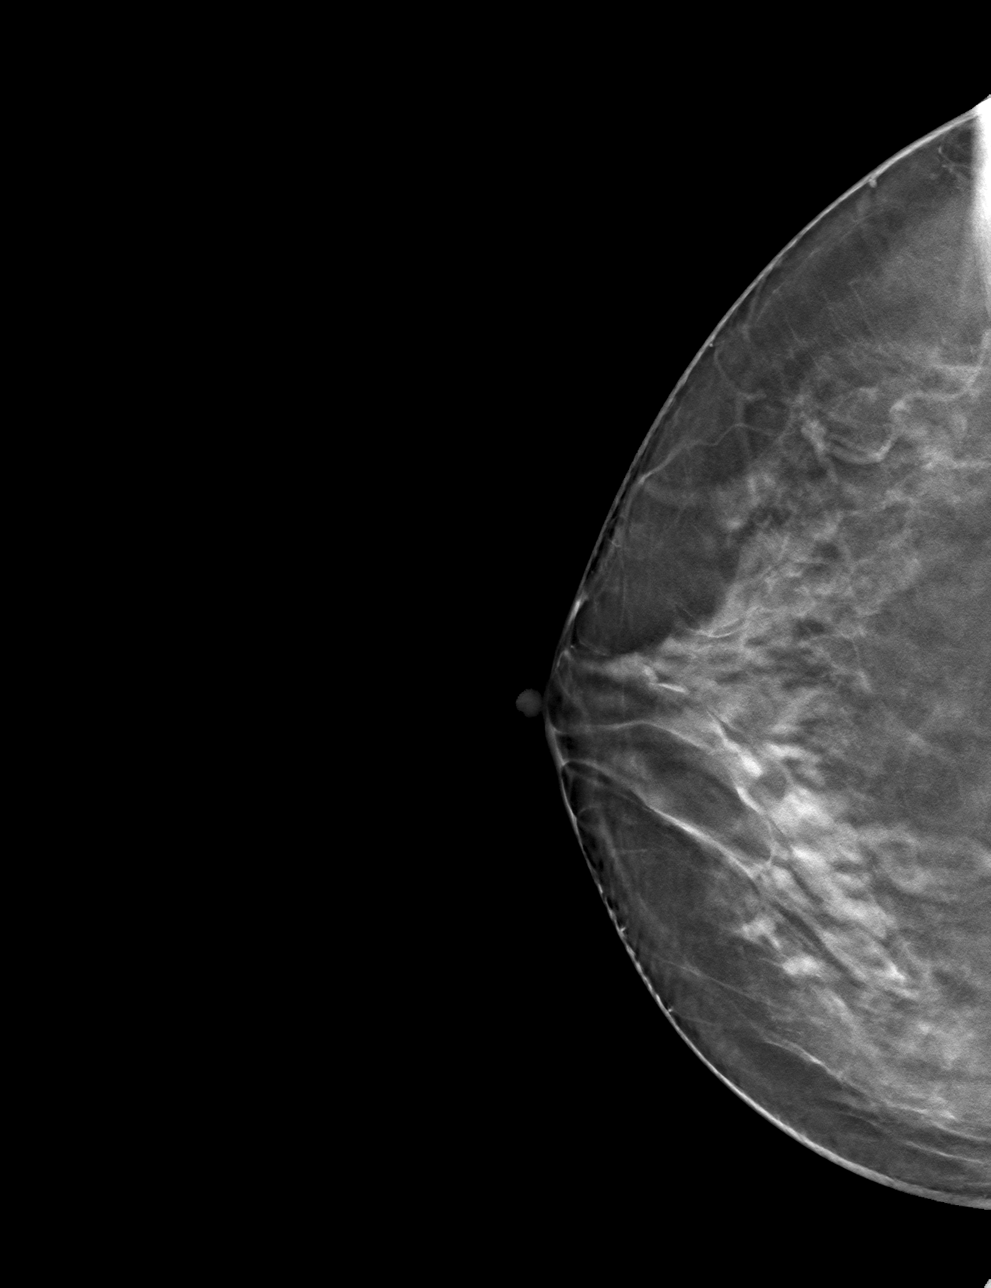

[R MLO tomo · tomo slice 35/68.0]
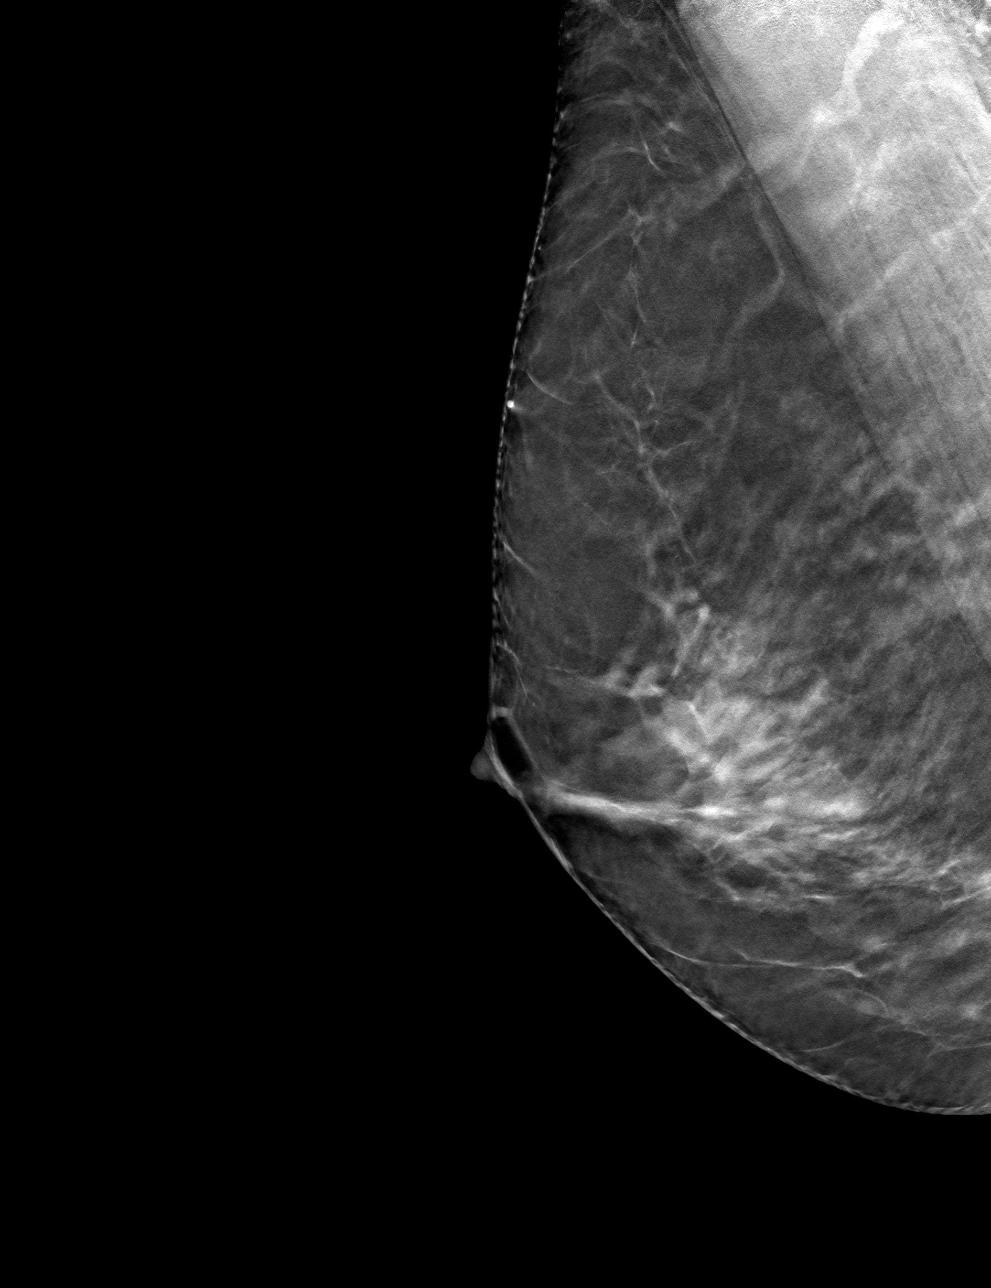

[4 of 12 positions shown; findings below may reference images not displayed]

ACR Breast Density Category b: There are scattered areas of
fibroglandular density.
FINDINGS: No suspicious masses, calcifications, or distortion identified in
the right breast.

Targeted ultrasound is performed, showing no sonographic correlate
to the lump felt at 4 o'clock by the patient's physician.
IMPRESSION: No mammographic or sonographic evidence of malignancy.

RECOMMENDATION:
Treatment of the lump should be based on clinical and physical exam
given lack of imaging findings. Recommend annual screening
mammography.

I have discussed the findings and recommendations with the patient.
If applicable, a reminder letter will be sent to the patient
regarding the next appointment.

BI-RADS CATEGORY  1: Negative.

## 2022-08-24 ENCOUNTER — Inpatient Hospital Stay: Payer: Medicare Other | Attending: Oncology | Admitting: Oncology

## 2022-08-24 ENCOUNTER — Telehealth: Payer: Self-pay | Admitting: Oncology

## 2022-08-24 ENCOUNTER — Encounter: Payer: Self-pay | Admitting: Oncology

## 2022-08-24 VITALS — BP 112/61 | HR 69 | Temp 97.6°F | Resp 18 | Ht 64.0 in | Wt 153.9 lb

## 2022-08-24 DIAGNOSIS — C50512 Malignant neoplasm of lower-outer quadrant of left female breast: Secondary | ICD-10-CM

## 2022-08-24 DIAGNOSIS — M810 Age-related osteoporosis without current pathological fracture: Secondary | ICD-10-CM | POA: Diagnosis not present

## 2022-08-24 DIAGNOSIS — Z170 Estrogen receptor positive status [ER+]: Secondary | ICD-10-CM

## 2022-08-24 NOTE — Progress Notes (Signed)
[facility]  [address] 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 This was treated with lumpectomy and adjuvant radiation. She was placed on hormonal therapy with Tamoxifen in November 2022, and will continue this for a total of 5 years.  Osteoporosis, for which she started alendronate. She will be due for repeat bone density in October 2024, which her PCP will schedule.   Plan: She has continued tamoxifen without difficulty and will continue this for a total of 5 years.  We will plan to see her back in 4 months for repeat evaluation.  She is already scheduled for her bilateral mammogram in May of 2024. Her PCP will schedule bone density scan in the Fall. She understands and agrees with this plan of care. I have answered her questions and she knows to call with any concerns.   I provided 15 minutes of face-to-face time during this this encounter and > 50% was spent counseling as documented under my assessment and plan.    [name], MD [facility] [address] [place] [address] Dept: [phone] Dept Fax: [phone]   CHIEF COMPLAINT:  CC: Stage IA hormone receptor positive left breast cancer  Current Treatment:  Tamoxifen   HISTORY OF PRESENT ILLNESS:  Katrina Williamson is a 67 y.o. female who presents as a transfer of care from Dr. Darrall Dears office for the continued management and treatment of stage IA (T1b N0 M0) hormone receptor positive invasive lobular carcinoma of the left breast, diagnosed in June 2022. This was found on screening mammogram from May 2022 which showed possible abnormality in the left breast. Diagnostic imaging from June confirmed a 0.9 mm mass in the left breast at  3:30 o'clock. No adenopathy was identified. Biopsy was pursued on June 14th and pathology revealed invasive mammary carcinoma, grade 2, and mammary carcinoma in situ. Estrogen receptor was positive at 95% and progesterone receptor was positive at 90%. HER2 was negative 1+. Ki67 was 1%. Bilateral breast MRI from July revealed a biopsy proven malignancy measuring 1.1 cm within the left breast. Right breast was negative. She underwent lumpectomy with Dr. Donell Beers on July 27th and final pathology from this procedure confirmed invasive lobular carcinoma, grade 2, 1.4 cm, lobular neoplasia. All margins were negative. Two sentinel lymph nodes were negative for carcinoma (0/2). She did have some mild complications after her procedure with opening of the incisions, which required packing. Oncotype revealed a recurrence score of 22, low risk, and predicts a risk of recurrence outside the breast over the next 9 years of 8%, if the patient's only systemic therapy is an antiestrogen for 5 years.  It also predicted  no benefit from chemotherapy. She received adjuvant radiation to the left breast, and was started on adjuvant endocrine therapy with tamoxifen in November 2022, with plans of 5 years of therapy.   INTERVAL HISTORY:  I have reviewed her chart and materials related to her cancer extensively and collaborated history with the patient. Summary of oncologic history is as follows: Oncology History  Malignant neoplasm of lower-outer quadrant of left breast of female, estrogen receptor positive (HCC)  11/29/2020 Initial Diagnosis   Malignant neoplasm of lower-outer quadrant of left breast of female, estrogen receptor positive (HCC)   11/30/2020 Cancer  Staging   Staging form: Breast, AJCC 8th Edition - Clinical: Stage IA (cT1b, cN0, cM0, G2, ER+, PR+, HER2-) - Signed by Lowella Dell, MD on 11/30/2020 Histologic grading system: 3 grade system   12/29/2020 Surgery   Left breast lumpectomy with sentinel lymph node.  Two lymph nodes biopsied, both negative for carcinoma.    Radiation Therapy   03/08/2021 through 04/04/2021 Site Technique Total Dose (Gy) Dose per Fx (Gy) Completed Fx Beam Energies  Breast, Left: Breast_Lt 3D 42.56/42.56 2.66 16/16 6X  Breast, Left: Breast_Lt_Bst 3D 8/8 2 4/4 6X     Surgery      INTERVAL HISTORY:  Katrina Williamson is here today for a routine follow up of stage 1A breast cancer and states that she is doing well and denies complaints. She continues tamoxifen daily without any other significant difficulty. She also continues to take Fosamax 70 mg weekly.  Her PCP will schedule her next bone density in the Fall. Her next bilateral mammogram is scheduled in May of 2024. She completes her labs every year with her PCP in May annually. Her  appetite is good, and she is eating well. Her weight has remained stable. She denies fever, chills or other signs of infection.  She denies nausea, vomiting, bowel issues, or abdominal pain.  She denies sore throat, cough, dyspnea, or chest pain.     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  See old records   Osteoporosis    Postmenopausal HRT (hormone replacement therapy) 03/14/2018   Menopause at age 22;     Past Surgical History:  Procedure Laterality Date   BREAST LUMPECTOMY WITH RADIOACTIVE SEED AND SENTINEL LYMPH NODE BIOPSY Left 12/29/2020   Procedure: LEFT BREAST LUMPECTOMY WITH RADIOACTIVE SEED;  Surgeon: Almond Lint, MD;  Location: [facility] OR;  Service: General;  Laterality: Left;   SENTINEL NODE BIOPSY Left 12/29/2020   Procedure: LEFT AXILLARY SENTINEL NODE BIOPSY;  Surgeon: Almond Lint, MD;  Location: [facility] OR;  Service: General;  Laterality: Left;    WISDOM TOOTH EXTRACTION      Family History  Problem Relation Age of Onset   Arthritis Mother    Hearing loss Mother    Osteoporosis Mother        significant   Stroke Father    Alcohol abuse Sister    Arthritis Sister    Cancer Sister        uterine; 58s    Social History:  reports that she has never smoked. She has never used smokeless tobacco. She reports current alcohol use of about 2.0 standard drinks of alcohol per week. She reports that she does not currently use drugs after having used the following drugs: Marijuana. She is divorced and lives at home. She is nulliparous. 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  Allergies: No Known Allergies  Current Medications: Current Outpatient Medications  Medication Sig Dispense Refill   alendronate (FOSAMAX) 70 MG tablet Take 1 tablet (70 mg total) by mouth every 7 (seven) days. Take with a full glass of water on an empty stomach. 12  tablet 3   fluticasone (FLONASE) 50 MCG/ACT nasal spray Place 2 sprays into both nostrils daily.     sertraline (ZOLOFT) 50 MG tablet Take 1 tablet (50 mg total) by mouth at bedtime. 90 tablet 3   tamoxifen (NOLVADEX) 20 MG tablet Take 1 tablet (20 mg total) by mouth daily. 90 tablet 3   No current facility-administered medications for this visit.    REVIEW OF SYSTEMS:  Review of Systems  Constitutional: Negative.  Negative for appetite change, chills, fatigue, fever and unexpected weight change.  HENT:  Negative.    Eyes: Negative.   Respiratory: Negative.  Negative for chest tightness, cough, hemoptysis, shortness of breath and wheezing.   Cardiovascular: Negative.  Negative for chest pain, leg swelling and palpitations.  Gastrointestinal: Negative.  Negative for abdominal distention, abdominal pain, blood in stool, constipation, diarrhea, nausea and vomiting.  Endocrine: Negative.   Genitourinary: Negative.  Negative for difficulty urinating,  dysuria, frequency and hematuria.   Musculoskeletal:  Negative for arthralgias, back pain, flank pain, gait problem and myalgias.       Occasional pain of the lumpectomy and axillary sites  Skin: Negative.   Neurological: Negative.  Negative for dizziness, extremity weakness, gait problem, headaches, light-headedness, numbness, seizures and speech difficulty.  Hematological: Negative.   Psychiatric/Behavioral: Negative.  Negative for depression and sleep disturbance. The patient is not nervous/anxious.       VITALS:  Blood pressure (!) 117/59, pulse 70, temperature 98 F (36.7 C), temperature source Oral, resp. rate 16, height  (1.626 m), weight 148 lb (67.1 kg), SpO2 96 %.  Wt Readings from Last 3 Encounters:  01/17/22 148 lb (67.1 kg)  10/17/21 146 lb 3.2 oz (66.3 kg)  10/03/21 147 lb (66.7 kg)    Body mass index is 25.4 kg/m.  Performance status (ECOG): 0 - Asymptomatic  PHYSICAL EXAM:  Physical Exam Constitutional:      General: She is not in acute distress.    Appearance: Normal appearance. She is normal weight.  HENT:     Head: Normocephalic and atraumatic.  Eyes:     General: No scleral icterus.    Extraocular Movements: Extraocular movements intact.     Conjunctiva/sclera: Conjunctivae normal.     Pupils: Pupils are equal, round, and reactive to light.  Cardiovascular:     Rate and Rhythm: Normal rate and regular rhythm.     Pulses: Normal pulses.     Heart sounds: Normal heart sounds. No murmur heard.    No friction rub. No gallop.  Pulmonary:     Effort: Pulmonary effort is normal. No respiratory distress.     Breath sounds: Normal breath sounds.  Chest:     Comments:  She has a faint scar in the left breast at about 3 o'clock and another scar in left axilla which is well healed. No masses In either breast.  Abdominal:     General: Bowel sounds are normal. There is no distension.     Palpations: Abdomen is soft. There is no hepatomegaly, splenomegaly or  mass.     Tenderness: There is no abdominal tenderness.  Musculoskeletal:        General: Normal range of motion.     Cervical back: Normal range of motion and neck supple.     Right lower leg: No edema.     Left lower leg: No edema.  Lymphadenopathy:     Cervical: No cervical adenopathy.  Skin:    General: Skin is warm and dry.  Neurological:     General: No focal deficit present.     Mental Status: She is alert and oriented to person, place, and time. Mental status is at baseline.  Psychiatric:        Mood and Affect: Mood normal.        Behavior: Behavior normal.        Thought Content: Thought content normal.        Judgment: Judgment normal.      LABS:      Latest Ref Rng & Units 07/19/2021   12:00 AM 04/13/2021   11:17 AM 12/21/2020    2:26 PM  CBC  WBC  4.2  4.1  6.0   Hemoglobin 12.0 - 16.0 13.5  13.2  13.4   Hematocrit 36 - 46 39  40.1  40.8   Platelets 150 - 399 195  182  247       Latest Ref Rng & Units 10/03/2021   10:11 AM 07/19/2021   12:00 AM 04/13/2021   11:17 AM  CMP  Glucose 70 - 99 mg/dL 86   86   BUN 6 - 23 mg/dL _0 Creatinine 0.40 - 1.20 mg/dL 1.09  0.9  1.01   Sodium 135 - 145 mEq/L 140  139  141   Potassium 3.5 - 5.1 mEq/L 3.8  4.0  4.0   Chloride 96 - 112 mEq/L 105  106  106   CO2 19 - 32 mEq/L _1 Calcium 8.4 - 10.5 mg/dL 8.9  8.6  9.3   Total Protein 6.0 - 8.3 g/dL 6.8   6.8   Total Bilirubin 0.2 - 1.2 mg/dL 0.5   0.6   Alkaline Phos 39 - 117 U/L 57  63  80   AST 0 - 37 U/L _2 ALT 0 - 35 U/L _3 STUDIES:  No results found.  EXAM:10/24/2021 DIGITAL DIAGNOSTIC BILATERLA MAMMOGRAM   FINDINGS: Lumpectomy changes are seen in the upper-outer quadrant of the left breast. No suspicious mass or malignant type microcalcifications identified in either breast.   IMPRESSION: No evidence of malignancy in either breast.   RECOMMENDATION: Bilateral diagnostic mammogram in 1 year is recommended.   I have  discussed the findings and recommendations with the patient. If applicable, a reminder letter will be sent to the patient regarding the next appointment.   BI-RADS CATEGORY  2: Benign.    I,Gabriella Ballesteros,acting as a scribe for Derwood Kaplan, MD.,have documented all relevant documentation on the behalf of Derwood Kaplan, MD,as directed by  Derwood Kaplan, MD while in the presence of Derwood Kaplan, MD.   I have reviewed this report as typed by the medical scribe, and it is complete and accurate.

## 2022-08-24 NOTE — Telephone Encounter (Signed)
Patient has been scheduled for follow-up visit per 08/24/22 LOS.  Pt given an appt calendar with date and time.

## 2022-09-26 ENCOUNTER — Telehealth: Payer: Self-pay | Admitting: Family Medicine

## 2022-09-26 NOTE — Telephone Encounter (Signed)
Copied from CRM [id number]. Topic: Medicare AWV >> Sep 26, 2022 10:51 AM Gwenith Spitz wrote: Reason for CRM: Called patient to REschedule Medicare Annual Wellness Visit (AWV). Left message for patient to call back and REschedule Medicare Annual Wellness Visit (AWV).  Last date of AWV: 09/30/2021  Please schedule an appointment at any time with Inetta Fermo, [facility]. Please schedule AWVS with Inetta Fermo, [facility].  If any questions, please contact me at [phone].  Thank you ,  Gabriel Cirri [facility] AWV TEAM Direct Dial [phone]

## 2022-09-29 ENCOUNTER — Ambulatory Visit
Admission: EM | Admit: 2022-09-29 | Discharge: 2022-09-29 | Disposition: A | Discharge status: Home / Self Care | Payer: Medicare Other

## 2022-09-29 ENCOUNTER — Ambulatory Visit (INDEPENDENT_AMBULATORY_CARE_PROVIDER_SITE_OTHER): Payer: Medicare Other

## 2022-09-29 DIAGNOSIS — J329 Chronic sinusitis, unspecified: Secondary | ICD-10-CM

## 2022-09-29 DIAGNOSIS — H6991 Unspecified Eustachian tube disorder, right ear: Secondary | ICD-10-CM | POA: Diagnosis not present

## 2022-09-29 DIAGNOSIS — J309 Allergic rhinitis, unspecified: Secondary | ICD-10-CM

## 2022-09-29 DIAGNOSIS — R059 Cough, unspecified: Secondary | ICD-10-CM | POA: Diagnosis not present

## 2022-09-29 DIAGNOSIS — J019 Acute sinusitis, unspecified: Secondary | ICD-10-CM

## 2022-09-29 DIAGNOSIS — J40 Bronchitis, not specified as acute or chronic: Secondary | ICD-10-CM | POA: Diagnosis not present

## 2022-09-29 MED ORDER — PROMETHAZINE-DM 6.25-15 MG/5ML PO SYRP: 5.0000 mL | ORAL_SOLUTION | Freq: Three times a day (TID) | ORAL | 0 refills | Status: DC | PRN

## 2022-09-29 MED ORDER — PREDNISONE 20 MG PO TABS: ORAL_TABLET | ORAL | 0 refills | Status: DC

## 2022-09-29 MED ORDER — AMOXICILLIN-POT CLAVULANATE 875-125 MG PO TABS: 1.0000 | ORAL_TABLET | Freq: Two times a day (BID) | ORAL | 0 refills | Status: DC

## 2022-09-29 NOTE — Discharge Instructions (Addendum)
We will manage this as a sinus infection with Augmentin. I would also like you to try a prednisone course to address all of your sinus inflammation, eustachian tube dysfunction. For sore throat or cough try using a honey-based tea. Use 3 teaspoons of honey with juice squeezed from half lemon. Place shaved pieces of ginger into 1/2-1 cup of water and warm over stove top. Then mix the ingredients and repeat every 4 hours as needed. Please take Tylenol 500mg -650mg  every 6 hours for throat pain, fevers, aches and pains. Hydrate very well with at least 2 liters of water. Eat light meals such as soups (chicken and noodles, vegetable, chicken and wild rice).  Do not eat foods that you are allergic to. Keep taking Zyrtec can help against postnasal drainage, sinus congestion which can cause sinus pain, sinus headaches, throat pain, painful swallowing, coughing.  When you finish the prednisone course, you can start taking pseudoephedrine (Sudafed) at a dose of 60 mg 3 times a day or twice daily as needed for the same kind of nasal drip, congestion.  Use cough medication as needed.

## 2022-09-29 NOTE — ED Provider Notes (Addendum)
[facility]  Note:  This document was prepared using Conservation officer, historic buildings and may include unintentional dictation errors.  MRN: [id number] DOB: 1956-03-06  Subjective:   Katrina Williamson is a 67 y.o. female presenting for 1 week history of persistent sinus congestion, sinus drainage, persistent coughing that elicits chest pain. Has mild intermittent asthma, does not have many issues with this. Has allergic rhinitis and has been using Zyrtec, DayQuil since the beginning of the year due to right ear ETD. Added NyQuil this past week. No smoking of any kind including cigarettes, cigars, vaping, marijuana use.    No current facility-administered medications for this encounter.  Current Outpatient Medications:    Pseudoeph-Doxylamine-DM-APAP (DAYQUIL/NYQUIL COLD/FLU RELIEF PO), Take by mouth., Disp: , Rfl:    alendronate (FOSAMAX) 70 MG tablet, TAKE 1 TABLET (70 MG TOTAL) BY MOUTH EVERY 7 DAYS WITH FULL GLASS WATER ON EMPTY STOMACH, Disp: 12 tablet, Rfl: 3   Cholecalciferol (VITAMIN D-1000 MAX ST) 25 MCG (1000 UT) tablet, Take by mouth., Disp: , Rfl:    fluticasone (FLONASE) 50 MCG/ACT nasal spray, Place 2 sprays into both nostrils daily., Disp: 16 g, Rfl: 0   sertraline (ZOLOFT) 50 MG tablet, Take 1 tablet (50 mg total) by mouth at bedtime., Disp: 90 tablet, Rfl: 3   tamoxifen (NOLVADEX) 20 MG tablet, Take 1 tablet (20 mg total) by mouth daily., Disp: 90 tablet, Rfl: 3   triamcinolone cream (KENALOG) 0.1 %, Apply small amount to ear canal on Q-tip daily for up to 2 weeks, then as needed., Disp: 45 g, Rfl: 0   No Known Allergies  Past Medical History:  Diagnosis Date   Allergies    Allergy 1990   seasonal   Anxiety 2006   Asthma    minor   Breast cancer (HCC) 11/17/20   Breast cancer in female [facility])    Frequent headaches    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  See old  records   Osteoporosis    Postmenopausal HRT (hormone replacement therapy) 03/14/2018   Menopause at age 35;      Past Surgical History:  Procedure Laterality Date   BREAST LUMPECTOMY WITH RADIOACTIVE SEED AND SENTINEL LYMPH NODE BIOPSY Left 12/29/2020   Procedure: LEFT BREAST LUMPECTOMY WITH RADIOACTIVE SEED;  Surgeon: Almond Lint, MD;  Location: [facility] OR;  Service: General;  Laterality: Left;   SENTINEL NODE BIOPSY Left 12/29/2020   Procedure: LEFT AXILLARY SENTINEL NODE BIOPSY;  Surgeon: Almond Lint, MD;  Location: [facility] OR;  Service: General;  Laterality: Left;   WISDOM TOOTH EXTRACTION      Family History  Problem Relation Age of Onset   Arthritis Mother    Hearing loss Mother    Osteoporosis Mother        significant   Stroke Father    Alcohol abuse Sister    Arthritis Sister    Cancer Sister        uterine; 25s    Social History   Tobacco Use   Smoking status: Never   Smokeless tobacco: Never  Vaping Use   Vaping Use: Never used  Substance Use Topics   Alcohol use: Yes    Alcohol/week: 2.0 standard drinks of alcohol    Types: 1 Glasses of wine, 1 Shots of liquor per week   Drug use: Not Currently    Types: Marijuana    ROS   Objective:  Vitals: BP 114/75 (BP Location: Right Arm)   Pulse 82   Temp 98.3 F (36.8 C) (Oral)   Resp 20   SpO2 97%   Physical Exam Constitutional:      General: She is not in acute distress.    Appearance: Normal appearance. She is well-developed and normal weight. She is not ill-appearing, toxic-appearing or diaphoretic.  HENT:     Head: Normocephalic and atraumatic.     Right Ear: Tympanic membrane, ear canal and external ear normal. No drainage or tenderness. No middle ear effusion. There is no impacted cerumen. Tympanic membrane is not erythematous or bulging.     Left Ear: Tympanic membrane, ear canal and external ear normal. No drainage or tenderness.  No middle ear effusion. There is no impacted cerumen. Tympanic  membrane is not erythematous or bulging.     Nose: Nose normal. No congestion or rhinorrhea.     Mouth/Throat:     Mouth: Mucous membranes are moist. No oral lesions.     Pharynx: No pharyngeal swelling, oropharyngeal exudate, posterior oropharyngeal erythema or uvula swelling.     Tonsils: No tonsillar exudate or tonsillar abscesses.  Eyes:     General: No scleral icterus.       Right eye: No discharge.        Left eye: No discharge.     Extraocular Movements: Extraocular movements intact.     Right eye: Normal extraocular motion.     Left eye: Normal extraocular motion.     Conjunctiva/sclera: Conjunctivae normal.  Cardiovascular:     Rate and Rhythm: Normal rate and regular rhythm.     Heart sounds: Normal heart sounds. No murmur heard.    No friction rub. No gallop.  Pulmonary:     Effort: Pulmonary effort is normal. No respiratory distress.     Breath sounds: No stridor. No wheezing, rhonchi or rales.  Chest:     Chest wall: No tenderness.  Musculoskeletal:     Cervical back: Normal range of motion and neck supple.  Lymphadenopathy:     Cervical: No cervical adenopathy.  Skin:    General: Skin is warm and dry.  Neurological:     General: No focal deficit present.     Mental Status: She is alert and oriented to person, place, and time.  Psychiatric:        Mood and Affect: Mood normal.        Behavior: Behavior normal.    DG Chest 2 View  Result Date: 09/29/2022 CLINICAL DATA:  Cough. EXAM: CHEST - 2 VIEW COMPARISON:  None Available. FINDINGS: Clear lungs. Normal heart size and mediastinal contours. No pleural effusion or pneumothorax. Visualized bones and upper abdomen are unremarkable. IMPRESSION: No evidence of acute cardiopulmonary disease. Electronically Signed   By: Orvan Falconer M.D.   On: 09/29/2022 14:11     Assessment and Plan :   PDMP not reviewed this encounter.  1. Acute sinusitis, recurrence not specified, unspecified location   2. Allergic rhinitis,  unspecified seasonality, unspecified trigger   3. Eustachian tube dysfunction, right   4. Sinobronchitis    Will start empiric treatment for sinusitis, sinobronchitis with Augmentin. Will supplement treatment by addressing concurrent ETD, allergic rhinitis flare with prednisone.  Recommended supportive care otherwise. Counseled patient on potential for adverse effects with medications prescribed/recommended today, ER and return-to-clinic precautions discussed, patient verbalized understanding.   Wallis Bamberg, PA-C 09/29/22 1420

## 2022-09-29 NOTE — ED Triage Notes (Signed)
Pt reports cough and nasal  congestion x 2 weeks. Dayquil and Nyquil gives relief.   Reports 2 negative COVID test.

## 2022-10-05 ENCOUNTER — Encounter: Payer: Self-pay | Admitting: Family Medicine

## 2022-10-05 ENCOUNTER — Ambulatory Visit (INDEPENDENT_AMBULATORY_CARE_PROVIDER_SITE_OTHER): Payer: Medicare Other | Admitting: Family Medicine

## 2022-10-05 VITALS — BP 116/76 | HR 65 | Temp 98.4°F | Ht 64.0 in | Wt 156.6 lb

## 2022-10-05 DIAGNOSIS — M810 Age-related osteoporosis without current pathological fracture: Secondary | ICD-10-CM | POA: Diagnosis not present

## 2022-10-05 DIAGNOSIS — Z170 Estrogen receptor positive status [ER+]: Secondary | ICD-10-CM

## 2022-10-05 DIAGNOSIS — Z79890 Hormone replacement therapy (postmenopausal): Secondary | ICD-10-CM

## 2022-10-05 DIAGNOSIS — C50512 Malignant neoplasm of lower-outer quadrant of left female breast: Secondary | ICD-10-CM

## 2022-10-05 DIAGNOSIS — Z1231 Encounter for screening mammogram for malignant neoplasm of breast: Secondary | ICD-10-CM

## 2022-10-05 DIAGNOSIS — H6993 Unspecified Eustachian tube disorder, bilateral: Secondary | ICD-10-CM | POA: Diagnosis not present

## 2022-10-05 DIAGNOSIS — J301 Allergic rhinitis due to pollen: Secondary | ICD-10-CM

## 2022-10-05 DIAGNOSIS — E782 Mixed hyperlipidemia: Secondary | ICD-10-CM | POA: Diagnosis not present

## 2022-10-05 DIAGNOSIS — F4322 Adjustment disorder with anxiety: Secondary | ICD-10-CM

## 2022-10-05 DIAGNOSIS — Z780 Asymptomatic menopausal state: Secondary | ICD-10-CM | POA: Diagnosis not present

## 2022-10-05 LAB — LDL CHOLESTEROL, DIRECT: Direct LDL: 133 mg/dL

## 2022-10-05 LAB — CBC WITH DIFFERENTIAL/PLATELET
Basophils Absolute: 0 10*3/uL (ref 0.0–0.1)
Basophils Relative: 0.3 % (ref 0.0–3.0)
Eosinophils Absolute: 0.2 10*3/uL (ref 0.0–0.7)
Eosinophils Relative: 1.4 % (ref 0.0–5.0)
HCT: 40.3 % (ref 36.0–46.0)
Hemoglobin: 13.3 g/dL (ref 12.0–15.0)
Lymphocytes Relative: 24.3 % (ref 12.0–46.0)
Lymphs Abs: 2.8 10*3/uL (ref 0.7–4.0)
MCHC: 33.1 g/dL (ref 30.0–36.0)
MCV: 91.4 fl (ref 78.0–100.0)
Monocytes Absolute: 1 10*3/uL (ref 0.1–1.0)
Monocytes Relative: 8.5 % (ref 3.0–12.0)
Neutro Abs: 7.7 10*3/uL (ref 1.4–7.7)
Neutrophils Relative %: 65.5 % (ref 43.0–77.0)
Platelets: 234 10*3/uL (ref 150.0–400.0)
RBC: 4.41 Mil/uL (ref 3.87–5.11)
RDW: 13.1 % (ref 11.5–15.5)
WBC: 11.7 10*3/uL — ABNORMAL HIGH (ref 4.0–10.5)

## 2022-10-05 LAB — COMPREHENSIVE METABOLIC PANEL
ALT: 31 U/L (ref 0–35)
AST: 17 U/L (ref 0–37)
Albumin: 4 g/dL (ref 3.5–5.2)
Alkaline Phosphatase: 65 U/L (ref 39–117)
BUN: 18 mg/dL (ref 6–23)
CO2: 26 mEq/L (ref 19–32)
Calcium: 9.1 mg/dL (ref 8.4–10.5)
Chloride: 105 mEq/L (ref 96–112)
Creatinine, Ser: 1 mg/dL (ref 0.40–1.20)
GFR: 58.49 mL/min — ABNORMAL LOW (ref >60.00)
Glucose, Bld: 71 mg/dL (ref 70–99)
Potassium: 3.4 mEq/L — ABNORMAL LOW (ref 3.5–5.1)
Sodium: 141 mEq/L (ref 135–145)
Total Bilirubin: 0.3 mg/dL (ref 0.2–1.2)
Total Protein: 6.5 g/dL (ref 6.0–8.3)

## 2022-10-05 LAB — TSH: TSH: 4.05 u[IU]/mL (ref 0.35–5.50)

## 2022-10-05 LAB — LIPID PANEL
Cholesterol: 209 mg/dL — ABNORMAL HIGH (ref 0–200)
HDL: 42.7 mg/dL (ref >39.00)
NonHDL: 166.26
Total CHOL/HDL Ratio: 5
Triglycerides: 235 mg/dL — ABNORMAL HIGH (ref 0.0–149.0)
VLDL: 47 mg/dL — ABNORMAL HIGH (ref 0.0–40.0)

## 2022-10-05 MED ORDER — SERTRALINE HCL 50 MG PO TABS: 50.0000 mg | ORAL_TABLET | Freq: Every day | ORAL | 3 refills | Status: DC

## 2022-10-05 NOTE — Progress Notes (Signed)
Subjective  Chief Complaint  Patient presents with   Annual Exam    Pt here for Annual Exam and is not currently fasting      HPI: Katrina Williamson is a 67 y.o. female who presents to [facility] today for a Female Wellness Visit. She also has the concerns and/or needs as listed above in the chief complaint. These will be addressed in addition to the Health Maintenance Visit.   Wellness Visit: annual visit with health maintenance review and exam without Pap  Health maintenance: Mammogram due next month.  Bone density due in October.  Doing very well overall.  Did have a serious upper respiratory infection, treated with antibiotics, prednisone and decongestants.  Still with ear symptoms and cough but better.  Complains of feeling tight in the chest and short of breath at times.  No history of asthma or COPD.  Non-smoker. Immunizations are current.  Colorectal cancer screening is current Chronic disease f/u and/or acute problem visit: (deemed necessary to be done in addition to the wellness visit): Breast cancer: Reviewed recent oncology notes.  Stable on tamoxifen.  Mammogram due.  Patient to schedule. Process: Tolerating Fosamax 70 mg weekly well.  No adverse effects.  Next bone density due in October. Doing well on sertraline 50 mg daily for adjustment disorder.  Anxiety is well-controlled. Mild hyperlipidemia without prescription medications.  Nonfasting today for recheck.  Diet is fair. Chronic allergies have been exacerbated by pollen season.  Eustachian tube dysfunction with popping and pressure in the ear has returned.  She did do well on Zyrtec and Flonase earlier in the year.  Assessment  1. Osteoporosis without current pathological fracture, unspecified osteoporosis type   2. Malignant neoplasm of lower-outer quadrant of left breast of female, estrogen receptor positive (HCC)   3. Adjustment disorder with anxiety   4. Postmenopausal HRT (hormone  replacement therapy)   5. Mixed hyperlipidemia   6. Asymptomatic menopausal state   7. ETD (Eustachian tube dysfunction), bilateral   8. Seasonal allergic rhinitis due to pollen   9. Screening mammogram for breast cancer      Plan  Female Wellness Visit: Age appropriate Health Maintenance and Prevention measures were discussed with patient. Included topics are cancer screening recommendations, ways to keep healthy (see AVS) including dietary and exercise recommendations, regular eye and dental care, use of seat belts, and avoidance of moderate alcohol use and tobacco use.  BMI: discussed patient's BMI and encouraged positive lifestyle modifications to help get to or maintain a target BMI. HM needs and immunizations were addressed and ordered. See below for orders. See HM and immunization section for updates. Routine labs and screening tests ordered including cmp, cbc and lipids where appropriate. Discussed recommendations regarding Vit D and calcium supplementation (see AVS)  Chronic disease management visit and/or acute problem visit: Breast cancer stable on tamoxifen Refilled sertraline 50 mg daily for anxiety disorder that is well-controlled Discussed Zyrtec, Flonase and treatment for upper respiratory tract infection.  Start albuterol as needed Checking lipid status.  Follow up: 12 months for complete physical Orders Placed This Encounter  Procedures   DG Bone Density   MM DIGITAL SCREENING BILATERAL   CBC with Differential/Platelet   Comprehensive metabolic panel   Lipid panel   TSH   Meds ordered this encounter  Medications   sertraline (ZOLOFT) 50 MG tablet    Sig: Take 1 tablet (50 mg total) by mouth at bedtime.    Dispense:  90  tablet    Refill:  3      Body mass index is 26.88 kg/m. 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 Started fosamax. On tamoxifen DEXA: 07/30/2018: lowest T = -2.6, now with osteoporosis. Will offer treatment. 08/2018 declined meds; will increase exercise, collagen, vit D and calcium. Will recheck in 2 years.  Dexa 03/2018, stable osteopenia, t = -2.3 lowest. 2011 DEXA, T equals -2.3.  See old records    Health Maintenance  Topic Date Due   Medicare Annual Wellness (AWV)  10/01/2022   COVID-19 Vaccine (6 - 2023-24 season) 10/21/2022 (Originally 05/24/2022)   MAMMOGRAM  10/25/2022   INFLUENZA VACCINE  01/04/2023   DEXA SCAN  03/29/2023   COLONOSCOPY (Pts 45-23yrs Insurance coverage will need to be confirmed)  03/14/2025   DTaP/Tdap/Td (2 - Td or Tdap) 03/14/2028   Pneumonia Vaccine 52+ Years old  Completed   Hepatitis C Screening  Completed   Zoster Vaccines- Shingrix  Completed   HPV VACCINES  Aged Out   Immunization History  Administered Date(s) Administered   Fluad Quad(high Dose 65+) 05/18/2021, 03/29/2022   Influenza-Unspecified 02/25/2018   PFIZER(Purple Top)SARS-COV-2 Vaccination 08/28/2019, 09/18/2019, 05/17/2020   Pfizer Covid-19 Vaccine Bivalent Booster 60yrs & up 03/02/2021, 03/29/2022   Pneumococcal Conjugate-13 10/04/2020   Pneumococcal Polysaccharide-23 10/03/2021   Tdap 03/14/2018   Zoster Recombinat (Shingrix) 02/26/2019, 05/21/2019   We updated and reviewed the patient's past history in  detail and it is documented below. Allergies: Patient has No Known Allergies. Past Medical History Patient  has a past medical history of Allergies, Allergy (1990), Anxiety (2006), Asthma, Breast cancer (HCC) (11/17/20), Breast cancer in female [facility]), Frequent headaches, Herpes simplex dermatitis of eyelid (02/17/2019), Osteopenia after menopause (03/14/2018), Osteoporosis, and Postmenopausal HRT (hormone replacement therapy) (03/14/2018). Past Surgical History Patient  has a past surgical history that includes Breast lumpectomy with radioactive seed and sentinel lymph node biopsy (Left, 12/29/2020); Sentinel node biopsy (Left, 12/29/2020); and Wisdom tooth extraction. Family History: Patient family history includes Alcohol abuse in her sister; Arthritis in her mother and sister; Cancer in her sister; Hearing loss in her mother; Osteoporosis in her mother; Stroke in her father. Social History:  Patient  reports that she has never smoked. She has never used smokeless tobacco. She reports current alcohol use of about 2.0 standard drinks of alcohol per week. She reports that she does not currently use drugs after having used the following drugs: Marijuana.  Review of Systems: Constitutional: negative for fever or malaise Ophthalmic: negative for photophobia, double vision or loss of vision Cardiovascular: negative for chest pain, dyspnea on exertion, or new LE swelling Respiratory: Positive for SOB or persistent cough Gastrointestinal: negative for abdominal pain, change in  bowel habits or melena Genitourinary: negative for dysuria or gross hematuria, no abnormal uterine bleeding or disharge Musculoskeletal: negative for new gait disturbance or muscular weakness Integumentary: negative for new or persistent rashes, no breast lumps Neurological: negative for TIA or stroke symptoms Psychiatric: negative for SI or delusions Allergic/Immunologic: negative for hives  Patient Care Team     Relationship Specialty Notifications Start End  Willow Ora, MD PCP - General Family Medicine  03/14/18   Magrinat, Valentino Hue, MD (Inactive) Consulting Physician Oncology  11/29/20   Almond Lint, MD Consulting Physician General Surgery  11/29/20   Dorothy Puffer, MD Consulting Physician Radiation Oncology  11/30/20   Jacqlyn Krauss, MD Referring Physician Dermatology  11/30/20   Loa Socks, NP Nurse Practitioner Hematology and Oncology  05/03/21   Maryclare Labrador, RN Registered Nurse   05/03/21   Paulene Floor, MD Consulting Physician Otolaryngology  10/03/21     Objective  Vitals: BP 116/76   Pulse 65   Temp 98.4 F (36.9 C)   Ht 5\' 4"  (1.626 m)   Wt 156 lb 9.6 oz (71 kg)   SpO2 97%   BMI 26.88 kg/m  General:  Well developed, well nourished, no acute distress, hacking cough present, no respiratory distress Psych:  Alert and orientedx3,normal mood and affect HEENT:  Normocephalic, atraumatic, non-icteric sclera, bilateral TMs with serous effusions without erythema, supple neck without adenopathy, mass or thyromegaly Cardiovascular:  Normal S1, S2, RRR without gallop, rub or murmur Respiratory:  Good breath sounds bilaterally, CTAB with normal respiratory effort, no rales rhonchi or wheezing Gastrointestinal: normal bowel sounds, soft, non-tender, no noted masses. No HSM MSK: extremities without edema, joints without erythema or swelling Neurologic:    Mental status is normal.  Gross motor and sensory exams are normal.  No tremor  Commons side effects, risks, benefits, and alternatives for medications and treatment plan prescribed today were discussed, and the patient expressed understanding of the given instructions. Patient is instructed to call or message via MyChart if he/she has any questions or concerns regarding our treatment plan. No barriers to understanding were identified. We discussed Red Flag symptoms and signs in detail. Patient expressed understanding  regarding what to do in case of urgent or emergency type symptoms.  Medication list was reconciled, printed and provided to the patient in AVS. Patient instructions and summary information was reviewed with the patient as documented in the AVS. This note was prepared with assistance of Dragon voice recognition software. Occasional wrong-word or sound-a-like substitutions may have occurred due to the inherent limitations of voice recognition software

## 2022-10-05 NOTE — Patient Instructions (Addendum)
Please return in 12 months for your annual complete physical; please come fasting.   I will release your lab results to you on your MyChart account with further instructions. You may see the results before I do, but when I review them I will send you a message with my report or have my assistant call you if things need to be discussed. Please reply to my message with any questions. Thank you!   If you have any questions or concerns, please don't hesitate to send me a message via MyChart or call the office at [phone]. Thank you for visiting with Katrina Williamson today! It's our pleasure caring for you.   Please call the office checked below to schedule your appointment for your mammogram and/or bone density screen (the checked studies were ordered): [x]   Mammogram due in May 2024 [x]   Bone Density due in October 2024  [x]   [facility]     [address] [place], [place]        [phone]         []   [facility]  [address] [place], [place]  [phone]

## 2022-10-09 ENCOUNTER — Telehealth: Payer: Self-pay | Admitting: Family Medicine

## 2022-10-09 NOTE — Telephone Encounter (Signed)
Copied from CRM [id number]. Topic: Medicare AWV >> Oct 09, 2022  9:34 AM Gwenith Spitz wrote: Called patient to reschedule Medicare Annual Wellness Visit (AWV). Left message for patient to call back and reschedule Medicare Annual Wellness Visit (AWV).  Last date of AWV: 09/30/2021  Please schedule an appointment at any time with Inetta Fermo, [facility]. Please reschedule AWVS with Inetta Fermo, [facility].  If any questions, please contact me at [phone].  Thank you ,  Gabriel Cirri [facility] AWV TEAM Direct Dial [phone]

## 2022-10-10 ENCOUNTER — Encounter: Payer: Self-pay | Admitting: Family Medicine

## 2022-10-10 ENCOUNTER — Other Ambulatory Visit: Payer: Self-pay | Admitting: Oncology

## 2022-10-10 DIAGNOSIS — Z853 Personal history of malignant neoplasm of breast: Secondary | ICD-10-CM

## 2022-10-11 MED ORDER — ALBUTEROL SULFATE HFA 108 (90 BASE) MCG/ACT IN AERS: 2.0000 | INHALATION_SPRAY | RESPIRATORY_TRACT | 2 refills | Status: DC | PRN

## 2022-10-25 DIAGNOSIS — L578 Other skin changes due to chronic exposure to nonionizing radiation: Secondary | ICD-10-CM | POA: Diagnosis not present

## 2022-10-25 DIAGNOSIS — L821 Other seborrheic keratosis: Secondary | ICD-10-CM | POA: Diagnosis not present

## 2022-10-25 DIAGNOSIS — D225 Melanocytic nevi of trunk: Secondary | ICD-10-CM | POA: Diagnosis not present

## 2022-10-25 DIAGNOSIS — Z411 Encounter for cosmetic surgery: Secondary | ICD-10-CM | POA: Diagnosis not present

## 2022-10-25 DIAGNOSIS — L814 Other melanin hyperpigmentation: Secondary | ICD-10-CM | POA: Diagnosis not present

## 2022-10-25 DIAGNOSIS — D2371 Other benign neoplasm of skin of right lower limb, including hip: Secondary | ICD-10-CM | POA: Diagnosis not present

## 2022-10-25 DIAGNOSIS — Z86018 Personal history of other benign neoplasm: Secondary | ICD-10-CM | POA: Diagnosis not present

## 2022-10-25 DIAGNOSIS — D2230 Melanocytic nevi of unspecified part of face: Secondary | ICD-10-CM | POA: Diagnosis not present

## 2022-11-13 ENCOUNTER — Ambulatory Visit (INDEPENDENT_AMBULATORY_CARE_PROVIDER_SITE_OTHER): Payer: Medicare Other

## 2022-11-13 VITALS — Wt 149.0 lb

## 2022-11-13 DIAGNOSIS — Z0000 Encounter for general adult medical examination without abnormal findings: Secondary | ICD-10-CM | POA: Diagnosis not present

## 2022-11-13 NOTE — Patient Instructions (Signed)
Katrina Williamson , Thank you for taking time to come for your Medicare Wellness Visit. I appreciate your ongoing commitment to your health goals. Please review the following plan we discussed and let me know if I can assist you in the future.   These are the goals we discussed:  Goals      Patient Stated     None at this Time      Patient Stated     Lose weight         This is a list of the screening recommended for you and due dates:  Health Maintenance  Topic Date Due   COVID-19 Vaccine (6 - 2023-24 season) 05/24/2022   Mammogram  10/25/2022   Flu Shot  01/04/2023   DEXA scan (bone density measurement)  03/29/2023   Medicare Annual Wellness Visit  11/13/2023   Colon Cancer Screening  03/14/2025   DTaP/Tdap/Td vaccine (2 - Td or Tdap) 03/14/2028   Pneumonia Vaccine  Completed   Hepatitis C Screening  Completed   Zoster (Shingles) Vaccine  Completed   HPV Vaccine  Aged Out    Advanced directives: Advance directive discussed with you today. Even though you declined this today please call our office should you change your mind and we can give you the proper paperwork for you to fill out.  Conditions/risks identified: lose weight   Next appointment: Follow up in one year for your annual wellness visit    Preventive Care 65 Years and Older, Female Preventive care refers to lifestyle choices and visits with your health care provider that can promote health and wellness. What does preventive care include? A yearly physical exam. This is also called an annual well check. Dental exams once or twice a year. Routine eye exams. Ask your health care provider how often you should have your eyes checked. Personal lifestyle choices, including: Daily care of your teeth and gums. Regular physical activity. Eating a healthy diet. Avoiding tobacco and drug use. Limiting alcohol use. Practicing safe sex. Taking low-dose aspirin every day. Taking vitamin and mineral supplements as  recommended by your health care provider. What happens during an annual well check? The services and screenings done by your health care provider during your annual well check will depend on your age, overall health, lifestyle risk factors, and family history of disease. Counseling  Your health care provider may ask you questions about your: Alcohol use. Tobacco use. Drug use. Emotional well-being. Home and relationship well-being. Sexual activity. Eating habits. History of falls. Memory and ability to understand (cognition). Work and work Astronomer. Reproductive health. Screening  You may have the following tests or measurements: Height, weight, and BMI. Blood pressure. Lipid and cholesterol levels. These may be checked every 5 years, or more frequently if you are over 53 years old. Skin check. Lung cancer screening. You may have this screening every year starting at age 58 if you have a 30-pack-year history of smoking and currently smoke or have quit within the past 15 years. Fecal occult blood test (FOBT) of the stool. You may have this test every year starting at age 49. Flexible sigmoidoscopy or colonoscopy. You may have a sigmoidoscopy every 5 years or a colonoscopy every 10 years starting at age 75. Hepatitis C blood test. Hepatitis B blood test. Sexually transmitted disease (STD) testing. Diabetes screening. This is done by checking your blood sugar (glucose) after you have not eaten for a while (fasting). You may have this done every 1-3 years. Bone density  scan. This is done to screen for osteoporosis. You may have this done starting at age 58. Mammogram. This may be done every 1-2 years. Talk to your health care provider about how often you should have regular mammograms. Talk with your health care provider about your test results, treatment options, and if necessary, the need for more tests. Vaccines  Your health care provider may recommend certain vaccines, such  as: Influenza vaccine. This is recommended every year. Tetanus, diphtheria, and acellular pertussis (Tdap, Td) vaccine. You may need a Td booster every 10 years. Zoster vaccine. You may need this after age 54. Pneumococcal 13-valent conjugate (PCV13) vaccine. One dose is recommended after age 27. Pneumococcal polysaccharide (PPSV23) vaccine. One dose is recommended after age 62. Talk to your health care provider about which screenings and vaccines you need and how often you need them. This information is not intended to replace advice given to you by your health care provider. Make sure you discuss any questions you have with your health care provider. Document Released: 06/18/2015 Document Revised: 02/09/2016 Document Reviewed: 03/23/2015 Elsevier Interactive Patient Education  2017 [employer].  Fall Prevention in the Home Falls can cause injuries. They can happen to people of all ages. There are many things you can do to make your home safe and to help prevent falls. What can I do on the outside of my home? Regularly fix the edges of walkways and driveways and fix any cracks. Remove anything that might make you trip as you walk through a door, such as a raised step or threshold. Trim any bushes or trees on the path to your home. Use bright outdoor lighting. Clear any walking paths of anything that might make someone trip, such as rocks or tools. Regularly check to see if handrails are loose or broken. Make sure that both sides of any steps have handrails. Any raised decks and porches should have guardrails on the edges. Have any leaves, snow, or ice cleared regularly. Use sand or salt on walking paths during winter. Clean up any spills in your garage right away. This includes oil or grease spills. What can I do in the bathroom? Use night lights. Install grab bars by the toilet and in the tub and shower. Do not use towel bars as grab bars. Use non-skid mats or decals in the tub or  shower. If you need to sit down in the shower, use a plastic, non-slip stool. Keep the floor dry. Clean up any water that spills on the floor as soon as it happens. Remove soap buildup in the tub or shower regularly. Attach bath mats securely with double-sided non-slip rug tape. Do not have throw rugs and other things on the floor that can make you trip. What can I do in the bedroom? Use night lights. Make sure that you have a light by your bed that is easy to reach. Do not use any sheets or blankets that are too big for your bed. They should not hang down onto the floor. Have a firm chair that has side arms. You can use this for support while you get dressed. Do not have throw rugs and other things on the floor that can make you trip. What can I do in the kitchen? Clean up any spills right away. Avoid walking on wet floors. Keep items that you use a lot in easy-to-reach places. If you need to reach something above you, use a strong step stool that has a grab bar. Keep electrical  cords out of the way. Do not use floor polish or wax that makes floors slippery. If you must use wax, use non-skid floor wax. Do not have throw rugs and other things on the floor that can make you trip. What can I do with my stairs? Do not leave any items on the stairs. Make sure that there are handrails on both sides of the stairs and use them. Fix handrails that are broken or loose. Make sure that handrails are as long as the stairways. Check any carpeting to make sure that it is firmly attached to the stairs. Fix any carpet that is loose or worn. Avoid having throw rugs at the top or bottom of the stairs. If you do have throw rugs, attach them to the floor with carpet tape. Make sure that you have a light switch at the top of the stairs and the bottom of the stairs. If you do not have them, ask someone to add them for you. What else can I do to help prevent falls? Wear shoes that: Do not have high heels. Have  rubber bottoms. Are comfortable and fit you well. Are closed at the toe. Do not wear sandals. If you use a stepladder: Make sure that it is fully opened. Do not climb a closed stepladder. Make sure that both sides of the stepladder are locked into place. Ask someone to hold it for you, if possible. Clearly mark and make sure that you can see: Any grab bars or handrails. First and last steps. Where the edge of each step is. Use tools that help you move around (mobility aids) if they are needed. These include: Canes. Walkers. Scooters. Crutches. Turn on the lights when you go into a dark area. Replace any light bulbs as soon as they burn out. Set up your furniture so you have a clear path. Avoid moving your furniture around. If any of your floors are uneven, fix them. If there are any pets around you, be aware of where they are. Review your medicines with your doctor. Some medicines can make you feel dizzy. This can increase your chance of falling. Ask your doctor what other things that you can do to help prevent falls. This information is not intended to replace advice given to you by your health care provider. Make sure you discuss any questions you have with your health care provider. Document Released: 03/18/2009 Document Revised: 10/28/2015 Document Reviewed: 06/26/2014 Elsevier Interactive Patient Education  2017 [employer].

## 2022-11-13 NOTE — Progress Notes (Signed)
I connected with  Katrina Williamson on 11/13/22 by a audio enabled telemedicine application and verified that I am speaking with the correct person using two identifiers.  Patient Location: Home  Provider Location: Office/Clinic  I discussed the limitations of evaluation and management by telemedicine. The patient expressed understanding and agreed to proceed.   Subjective:   Katrina Williamson is a 67 y.o. female who presents for Medicare Annual (Subsequent) preventive examination.  Review of Systems     Cardiac Risk Factors include: advanced age (>21men, >72 women)     Objective:    Today's Vitals   11/13/22 0902  Weight: 149 lb (67.6 kg)   Body mass index is 25.58 kg/m.     11/13/2022    9:08 AM 10/17/2021   11:05 AM 09/30/2021   10:20 AM 02/10/2021    1:42 PM 12/09/2020    1:38 PM  Advanced Directives  Does Patient Have a Medical Advance Directive? No No No No No  Would patient like information on creating a medical advance directive? No - Patient declined  No - Patient declined No - Patient declined No - Patient declined    Current Medications (verified) Outpatient Encounter Medications as of 11/13/2022  Medication Sig   alendronate (FOSAMAX) 70 MG tablet TAKE 1 TABLET (70 MG TOTAL) BY MOUTH EVERY 7 DAYS WITH FULL GLASS WATER ON EMPTY STOMACH   fluticasone (FLONASE) 50 MCG/ACT nasal spray Place 2 sprays into both nostrils daily.   sertraline (ZOLOFT) 50 MG tablet Take 1 tablet (50 mg total) by mouth at bedtime.   tamoxifen (NOLVADEX) 20 MG tablet Take 1 tablet (20 mg total) by mouth daily.   triamcinolone cream (KENALOG) 0.1 % Apply small amount to ear canal on Q-tip daily for up to 2 weeks, then as needed.   [DISCONTINUED] albuterol (VENTOLIN HFA) 108 (90 Base) MCG/ACT inhaler Inhale 2 puffs into the lungs every 4 (four) hours as needed for wheezing or shortness of breath.   [DISCONTINUED] amoxicillin-clavulanate (AUGMENTIN) 875-125 MG tablet Take 1 tablet by mouth 2 (two)  times daily.   [DISCONTINUED] Cholecalciferol (VITAMIN D-1000 MAX ST) 25 MCG (1000 UT) tablet Take by mouth.   [DISCONTINUED] promethazine-dextromethorphan (PROMETHAZINE-DM) 6.25-15 MG/5ML syrup Take 5 mLs by mouth 3 (three) times daily as needed for cough.   [DISCONTINUED] Pseudoeph-Doxylamine-DM-APAP (DAYQUIL/NYQUIL COLD/FLU RELIEF PO) Take by mouth.   No facility-administered encounter medications on file as of 11/13/2022.    Allergies (verified) Patient has no known allergies.   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  See old records   Osteoporosis    Postmenopausal HRT (hormone replacement therapy) 03/14/2018   Menopause at age 70;    Past Surgical History:  Procedure Laterality Date   BREAST LUMPECTOMY WITH RADIOACTIVE SEED AND SENTINEL LYMPH NODE BIOPSY Left 12/29/2020   Procedure: LEFT BREAST LUMPECTOMY WITH RADIOACTIVE SEED;  Surgeon: Almond Lint, MD;  Location: [facility] OR;  Service: General;  Laterality: Left;   SENTINEL NODE BIOPSY Left 12/29/2020   Procedure: LEFT AXILLARY SENTINEL NODE BIOPSY;  Surgeon: Almond Lint, MD;  Location: [facility] OR;  Service: General;  Laterality: Left;   WISDOM TOOTH  EXTRACTION     Family History  Problem Relation Age of Onset   Arthritis Mother    Hearing loss Mother    Osteoporosis Mother        significant   Stroke Father    Alcohol abuse Sister    Arthritis Sister    Cancer Sister        uterine; 75s   Social History   Socioeconomic History   Marital status: Married    Spouse name: Not on file   Number of children: Not on file   Years of education: Not on file   Highest education level: Not on file  Occupational History   Not on file  Tobacco  Use   Smoking status: Never   Smokeless tobacco: Never  Vaping Use   Vaping Use: Never used  Substance and Sexual Activity   Alcohol use: Yes    Alcohol/week: 2.0 standard drinks of alcohol    Types: 1 Glasses of wine, 1 Shots of liquor per week   Drug use: Not Currently    Types: Marijuana   Sexual activity: Yes    Birth control/protection: Post-menopausal  Other Topics Concern   Not on file  Social History Narrative   Not on file   Social Determinants of Health   Financial Resource Strain: Low Risk  (11/13/2022)   Overall Financial Resource Strain (CARDIA)    Difficulty of Paying Living Expenses: Not hard at all  Food Insecurity: No Food Insecurity (11/13/2022)   Hunger Vital Sign    Worried About Running Out of Food in the Last Year: Never true    Ran Out of Food in the Last Year: Never true  Transportation Needs: No Transportation Needs (11/13/2022)   PRAPARE - Administrator, Civil Service (Medical): No    Lack of Transportation (Non-Medical): No  Physical Activity: Insufficiently Active (11/13/2022)   Exercise Vital Sign    Days of Exercise per Week: 5 days    Minutes of Exercise per Session: 20 min  Stress: No Stress Concern Present (11/13/2022)   [employer] of Occupational Health - Occupational Stress Questionnaire    Feeling of Stress : Not at all  Social Connections: Moderately Integrated (11/13/2022)   Social Connection and Isolation Panel [NHANES]    Frequency of Communication with Friends and Family: More than three times a week    Frequency of Social Gatherings with Friends and Family: More than three times a week    Attends Religious Services: Never    Database administrator or Organizations: Yes    Attends Engineer, structural: 1 to 4 times per year    Marital Status: Married    Tobacco Counseling Counseling given: Not Answered   Clinical Intake:  Pre-visit preparation completed: Yes  Pain : No/denies pain     BMI -  recorded: 25.58 Nutritional Status: BMI 25 -29 Overweight Nutritional Risks: None Diabetes: No  How often do you need to have someone help you when you read instructions, pamphlets, or other written materials from your doctor or pharmacy?: 1 - Never  Diabetic?no  Interpreter Needed?: No  Information entered by :: Lanier Ensign, LPN   Activities of Daily Living    11/13/2022    9:10 AM 10/13/2022    9:16 AM  In your present state of health, do you have any difficulty performing the following activities:  Hearing? 0 0  Vision? 0 0  Difficulty concentrating or making decisions? 0 0  Walking or  climbing stairs? 0 0  Dressing or bathing? 0 0  Doing errands, shopping? 0 0  Preparing Food and eating ? N N  Using the Toilet? N N  In the past six months, have you accidently leaked urine? N Y  Do you have problems with loss of bowel control? N N  Managing your Medications? N N  Managing your Finances? N N  Housekeeping or managing your Housekeeping? N N    Patient Care Team: Willow Ora, MD as PCP - General (Family Medicine) Magrinat, Valentino Hue, MD (Inactive) as Consulting Physician (Oncology) Almond Lint, MD as Consulting Physician (General Surgery) Dorothy Puffer, MD as Consulting Physician (Radiation Oncology) Haverstock, Elvin So, MD as Referring Physician (Dermatology) Axel Filler, Larna Daughters, NP as Nurse Practitioner (Hematology and Oncology) Maryclare Labrador, RN as Registered Nurse Paulene Floor, MD as Consulting Physician (Otolaryngology)  Indicate any recent Medical Services you may have received from other than [facility] providers in the past year (date may be approximate).     Assessment:   This is a routine wellness examination for Maryann.  Hearing/Vision screen Hearing Screening - Comments:: Pt denies any hearing issues  Vision Screening - Comments:: Pt follows with Dr Carmon Ginsberg for annual eye exams   Dietary issues and exercise activities  discussed: Current Exercise Habits: Home exercise routine, Type of exercise: walking;Other - see comments, Time (Minutes): 20, Frequency (Times/Week): 5, Weekly Exercise (Minutes/Week): 100   Goals Addressed             This Visit's Progress    Patient Stated       Lose weight        Depression Screen    11/13/2022    9:06 AM 10/05/2022    9:40 AM 09/30/2021   10:19 AM 05/06/2021    2:38 PM 10/04/2020    1:00 PM 08/06/2018   11:20 AM 04/25/2018   10:23 AM  PHQ 2/9 Scores  PHQ - 2 Score 0 0 0 0 0 0 0  PHQ- 9 Score       0    Fall Risk    11/13/2022    9:10 AM 10/13/2022    9:16 AM 10/05/2022    9:40 AM 06/29/2022   10:52 AM 10/03/2021    9:52 AM  Fall Risk   Falls in the past year? 0 0 0 0 0  Number falls in past yr: 0 0 0 0 0  Injury with Fall? 0 0 0 0 0  Risk for fall due to : Impaired vision  No Fall Risks No Fall Risks No Fall Risks  Follow up Falls prevention discussed  Falls evaluation completed Falls evaluation completed Falls evaluation completed    FALL RISK PREVENTION PERTAINING TO THE HOME:  Any stairs in or around the home? No  If so, are there any without handrails? No  Home free of loose throw rugs in walkways, pet beds, electrical cords, etc? Yes  Adequate lighting in your home to reduce risk of falls? Yes   ASSISTIVE DEVICES UTILIZED TO PREVENT FALLS:  Life alert? No  Use of a cane, walker or w/c? No  Grab bars in the bathroom? Yes  Shower chair or bench in shower? No  Elevated toilet seat or a handicapped toilet? No   TIMED UP AND GO:  Was the test performed? No .   Cognitive Function:        11/13/2022    9:10 AM 09/30/2021   10:27 AM  6CIT  Screen  What Year? 0 points 0 points  What month? 0 points 0 points  What time? 0 points 0 points  Count back from 20 0 points 0 points  Months in reverse 0 points 0 points  Repeat phrase 0 points 0 points  Total Score 0 points 0 points    Immunizations Immunization History  Administered Date(s)  Administered   Fluad Quad(high Dose 65+) 05/18/2021, 03/29/2022   Influenza-Unspecified 02/25/2018   PFIZER(Purple Top)SARS-COV-2 Vaccination 08/28/2019, 09/18/2019, 05/17/2020   Pfizer Covid-19 Vaccine Bivalent Booster 63yrs & up 03/02/2021, 03/29/2022   Pneumococcal Conjugate-13 10/04/2020   Pneumococcal Polysaccharide-23 10/03/2021   Tdap 03/14/2018   Zoster Recombinat (Shingrix) 02/26/2019, 05/21/2019    TDAP status: Up to date  Flu Vaccine status: Up to date  Pneumococcal vaccine status: Up to date  Covid-19 vaccine status: Completed vaccines  Qualifies for Shingles Vaccine? Yes   Zostavax completed Yes   Shingrix Completed?: Yes  Screening Tests Health Maintenance  Topic Date Due   COVID-19 Vaccine (6 - 2023-24 season) 05/24/2022   MAMMOGRAM  10/25/2022   INFLUENZA VACCINE  01/04/2023   DEXA SCAN  03/29/2023   Medicare Annual Wellness (AWV)  11/13/2023   Colonoscopy  03/14/2025   DTaP/Tdap/Td (2 - Td or Tdap) 03/14/2028   Pneumonia Vaccine 17+ Years old  Completed   Hepatitis C Screening  Completed   Zoster Vaccines- Shingrix  Completed   HPV VACCINES  Aged Out    Health Maintenance  Health Maintenance Due  Topic Date Due   COVID-19 Vaccine (6 - 2023-24 season) 05/24/2022   MAMMOGRAM  10/25/2022    Colorectal cancer screening: Type of screening: Colonoscopy. Completed 03/15/15. Repeat every 10 years  Mammogram status: Completed 10/24/21. Repeat every year, Scheduled 11/16/22  Bone Density status: Completed 03/28/21. Results reflect: Bone density results: OSTEOPOROSIS. Repeat every Pt scheduled 04/30/23 years.   Additional Screening:  Hepatitis C Screening:  Completed 05/22/17  Vision Screening: Recommended annual ophthalmology exams for early detection of glaucoma and other disorders of the eye. Is the patient up to date with their annual eye exam?  Yes  Who is the provider or what is the name of the office in which the patient attends annual eye exams?  Dr Elisha Headland  If pt is not established with a provider, would they like to be referred to a provider to establish care? No .   Dental Screening: Recommended annual dental exams for proper oral hygiene  Community Resource Referral / Chronic Care Management: CRR required this visit?  No   CCM required this visit?  No      Plan:     I have personally reviewed and noted the following in the patient's chart:   Medical and social history Use of alcohol, tobacco or illicit drugs  Current medications and supplements including opioid prescriptions. Patient is not currently taking opioid prescriptions. Functional ability and status Nutritional status Physical activity Advanced directives List of other physicians Hospitalizations, surgeries, and ER visits in previous 12 months Vitals Screenings to include cognitive, depression, and falls Referrals and appointments  In addition, I have reviewed and discussed with patient certain preventive protocols, quality metrics, and best practice recommendations. A written personalized care plan for preventive services as well as general preventive health recommendations were provided to patient.     Marzella Schlein, LPN   [id number]/[id number]   Nurse Notes: none

## 2022-11-16 ENCOUNTER — Ambulatory Visit
Admission: RE | Admit: 2022-11-16 | Discharge: 2022-11-16 | Disposition: A | Discharge status: Home / Self Care | Payer: Medicare Other | Source: Ambulatory Visit | Attending: Oncology | Admitting: Oncology

## 2022-11-16 DIAGNOSIS — Z853 Personal history of malignant neoplasm of breast: Secondary | ICD-10-CM | POA: Diagnosis not present

## 2022-11-29 ENCOUNTER — Other Ambulatory Visit: Payer: Self-pay | Admitting: Oncology

## 2022-11-29 DIAGNOSIS — Z170 Estrogen receptor positive status [ER+]: Secondary | ICD-10-CM

## 2022-11-29 MED ORDER — TAMOXIFEN CITRATE 20 MG PO TABS: 20.0000 mg | ORAL_TABLET | Freq: Every day | ORAL | 3 refills | Status: DC

## 2022-12-14 DIAGNOSIS — J310 Chronic rhinitis: Secondary | ICD-10-CM | POA: Diagnosis not present

## 2022-12-14 DIAGNOSIS — H9201 Otalgia, right ear: Secondary | ICD-10-CM | POA: Diagnosis not present

## 2022-12-14 DIAGNOSIS — H903 Sensorineural hearing loss, bilateral: Secondary | ICD-10-CM | POA: Diagnosis not present

## 2022-12-14 DIAGNOSIS — L299 Pruritus, unspecified: Secondary | ICD-10-CM | POA: Diagnosis not present

## 2022-12-15 NOTE — Progress Notes (Incomplete)
[facility]  [address] 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 This was treated with lumpectomy and adjuvant radiation. She was placed on hormonal therapy with Tamoxifen in November 2022, and will continue this for a total of 5 years.  Osteoporosis, for which she started alendronate. She will be due for repeat bone density in October 2024, which her PCP will schedule.   Plan: She has continued tamoxifen without difficulty and will continue this for a total of 5 years.  We will plan to see her back in 4 months for repeat evaluation.  She is already scheduled for her bilateral mammogram in May of 2024. Her PCP will schedule bone density scan in the Fall. She understands and agrees with this plan of care. I have answered her questions and she knows to call with any concerns.   I provided 15 minutes of face-to-face time during this this encounter and > 50% was spent counseling as documented under my assessment and plan.    [name], MD [facility] AT [facility] [address] [place] [address] Dept: [phone] Dept Fax: [phone]   CHIEF COMPLAINT:  CC: Stage IA hormone receptor positive left breast cancer  Current Treatment:  Tamoxifen   HISTORY OF PRESENT ILLNESS:  Katrina Williamson is a 67 y.o. female who presents as a transfer of care from Dr. Darrall Dears office for the continued management and treatment of stage IA (T1b N0 M0) hormone receptor positive invasive lobular carcinoma of the left breast, diagnosed in June 2022. This was found on screening mammogram from May 2022 which showed possible abnormality in the left breast. Diagnostic imaging from June confirmed a 0.9 mm mass in the left  breast at 3:30 o'clock. No adenopathy was identified. Biopsy was pursued on June 14th and pathology revealed invasive mammary carcinoma, grade 2, and mammary carcinoma in situ. Estrogen receptor was positive at 95% and progesterone receptor was positive at 90%. HER2 was negative 1+. Ki67 was 1%. Bilateral breast MRI from July revealed a biopsy proven malignancy measuring 1.1 cm within the left breast. Right breast was negative. She underwent lumpectomy with Dr. Donell Beers on July 27th and final pathology from this procedure confirmed invasive lobular carcinoma, grade 2, 1.4 cm, lobular neoplasia. All margins were negative. Two sentinel lymph nodes were negative for carcinoma (0/2). She did have some mild complications after her procedure with opening of the incisions, which required packing. Oncotype revealed a recurrence score of 22, low risk, and predicts a risk of recurrence outside the breast over the next 9 years of 8%, if the patient's only systemic therapy is an antiestrogen for 5 years.  It also predicted  no benefit from chemotherapy. She received adjuvant radiation to the left breast, and was started on adjuvant endocrine therapy with tamoxifen in November 2022, with plans of 5 years of therapy.   I have reviewed her chart and materials related to her cancer extensively and collaborated history with the patient. Summary of oncologic history is as follows: Oncology History  Malignant neoplasm of lower-outer quadrant of left breast of female, estrogen receptor positive (HCC)  11/29/2020 Initial Diagnosis   Malignant neoplasm of lower-outer quadrant of left breast of female, estrogen receptor positive (HCC)   11/30/2020 Cancer Staging  Staging form: Breast, AJCC 8th Edition - Clinical: Stage IA (cT1b, cN0, cM0, G2, ER+, PR+, HER2-) - Signed by Lowella Dell, MD on 11/30/2020 Histologic grading system: 3 grade system   12/29/2020 Surgery   Left breast lumpectomy with sentinel lymph node. Two lymph  nodes biopsied, both negative for carcinoma.    Radiation Therapy   03/08/2021 through 04/04/2021 Site Technique Total Dose (Gy) Dose per Fx (Gy) Completed Fx Beam Energies  Breast, Left: Breast_Lt 3D 42.56/42.56 2.66 16/16 6X  Breast, Left: Breast_Lt_Bst 3D 8/8 2 4/4 6X     Surgery       INTERVAL HISTORY:  Katrina Williamson is here today for a routine follow up of stage 1A breast cancer. Patient states that she feels *** and ***. She continues Tamoxifen without difficulty.    She denies signs of infection such as sore throat, sinus drainage, cough, or urinary symptoms.  She denies fevers or recurrent chills. She denies pain. She denies nausea, vomiting, chest pain, dyspnea or cough. Her appetite is *** and her weight {Weight change:10426}.   and states that she is doing well and denies complaints. She continues tamoxifen daily without any other significant difficulty. She also continues to take Fosamax 70 mg weekly.  Her PCP will schedule her next bone density in the Fall. Her next bilateral mammogram is scheduled in May of 2024. She completes her labs every year with her PCP in May annually. Her  appetite is good, and she is eating well. Her weight has remained stable. She denies fever, chills or other signs of infection.  She denies nausea, vomiting, bowel issues, or abdominal pain.  She denies sore throat, cough, dyspnea, or chest pain.     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  See old records   Osteoporosis    Postmenopausal HRT (hormone replacement therapy) 03/14/2018   Menopause at age 36;     Past Surgical History:  Procedure Laterality Date   BREAST LUMPECTOMY WITH RADIOACTIVE SEED AND SENTINEL  LYMPH NODE BIOPSY Left 12/29/2020   Procedure: LEFT BREAST LUMPECTOMY WITH RADIOACTIVE SEED;  Surgeon: Almond Lint, MD;  Location: [facility] OR;  Service: General;  Laterality: Left;   SENTINEL NODE BIOPSY Left 12/29/2020   Procedure: LEFT AXILLARY SENTINEL NODE BIOPSY;  Surgeon: Almond Lint, MD;  Location: [facility] OR;  Service: General;  Laterality: Left;   WISDOM TOOTH EXTRACTION      Family History  Problem Relation Age of Onset   Arthritis Mother    Hearing loss Mother    Osteoporosis Mother        significant   Stroke Father    Alcohol abuse Sister    Arthritis Sister    Cancer Sister        uterine; 12s    Social History:  reports that she has never smoked. She has never used smokeless tobacco. She reports current alcohol use of about 2.0 standard drinks of alcohol per week. She reports that she does not currently use drugs after having used the following drugs: Marijuana. She is divorced and lives at home. She is nulliparous. She is retired from office  work, and has never been exposed to chemicals or other toxic agents.  Allergies: No Known Allergies  Current Medications: Current Outpatient Medications  Medication Sig Dispense Refill   alendronate (FOSAMAX) 70 MG tablet Take 1 tablet (70 mg total) by mouth every 7 (seven) days. Take with a full glass of water on an empty stomach. 12 tablet 3   fluticasone (FLONASE) 50 MCG/ACT nasal spray Place 2 sprays into both nostrils daily.     sertraline (ZOLOFT) 50 MG tablet Take 1 tablet (50 mg total) by mouth at bedtime. 90 tablet 3   tamoxifen (NOLVADEX) 20 MG tablet Take 1 tablet (20 mg total) by mouth daily. 90 tablet 3   No current facility-administered medications for this visit.    REVIEW OF SYSTEMS:  Review of Systems  Constitutional: Negative.  Negative for appetite change, chills, diaphoresis, fatigue, fever and unexpected weight change.  HENT:  Negative.  Negative for hearing loss, lump/mass, mouth sores, nosebleeds, sore  throat, tinnitus, trouble swallowing and voice change.   Eyes: Negative.  Negative for eye problems and icterus.  Respiratory: Negative.  Negative for chest tightness, cough, hemoptysis, shortness of breath and wheezing.   Cardiovascular: Negative.  Negative for chest pain, leg swelling and palpitations.  Gastrointestinal: Negative.  Negative for abdominal distention, abdominal pain, blood in stool, constipation, diarrhea, nausea, rectal pain and vomiting.  Endocrine: Negative.   Genitourinary: Negative.  Negative for bladder incontinence, difficulty urinating, dyspareunia, dysuria, frequency, hematuria, menstrual problem, nocturia, pelvic pain, vaginal bleeding and vaginal discharge.   Musculoskeletal: Negative.  Negative for arthralgias, back pain, flank pain, gait problem, myalgias, neck pain and neck stiffness.       Occasional pain of the lumpectomy and axillary sites.   Skin: Negative.  Negative for itching, rash and wound.  Neurological:  Negative for dizziness, extremity weakness, gait problem, headaches, light-headedness, numbness, seizures and speech difficulty.  Hematological: Negative.  Negative for adenopathy. Does not bruise/bleed easily.  Psychiatric/Behavioral: Negative.  Negative for confusion, decreased concentration, depression, sleep disturbance and suicidal ideas. The patient is not nervous/anxious.    VITALS:  Blood pressure (!) 117/59, pulse 70, temperature 98 F (36.7 C), temperature source Oral, resp. rate 16, height 5\' 4"  (1.626 m), weight 148 lb (67.1 kg), SpO2 96 %.  Wt Readings from Last 3 Encounters:  01/17/22 148 lb (67.1 kg)  10/17/21 146 lb 3.2 oz (66.3 kg)  10/03/21 147 lb (66.7 kg)    Body mass index is 25.4 kg/m.  Performance status (ECOG): 0 - Asymptomatic  PHYSICAL EXAM:  Physical Exam Vitals and nursing note reviewed.  Constitutional:      General: She is not in acute distress.    Appearance: Normal appearance. She is normal weight. She is not  ill-appearing, toxic-appearing or diaphoretic.  HENT:     Head: Normocephalic and atraumatic.     Right Ear: Tympanic membrane, ear canal and external ear normal. There is no impacted cerumen.     Left Ear: Tympanic membrane, ear canal and external ear normal. There is no impacted cerumen.     Nose: Nose normal. No congestion or rhinorrhea.     Mouth/Throat:     Mouth: Mucous membranes are moist.     Pharynx: Oropharynx is clear. No oropharyngeal exudate or posterior oropharyngeal erythema.  Eyes:     General: No scleral icterus.       Right eye: No discharge.        Left eye: No discharge.     Extraocular Movements:  Extraocular movements intact.     Conjunctiva/sclera: Conjunctivae normal.     Pupils: Pupils are equal, round, and reactive to light.  Neck:     Vascular: No carotid bruit.  Cardiovascular:     Rate and Rhythm: Normal rate and regular rhythm.     Pulses: Normal pulses.     Heart sounds: Normal heart sounds. No murmur heard.    No friction rub. No gallop.  Pulmonary:     Effort: Pulmonary effort is normal. No respiratory distress.     Breath sounds: Normal breath sounds. No stridor. No wheezing, rhonchi or rales.  Chest:     Chest wall: No tenderness.     Comments: She has a faint scar in the left breast at about 3 o'clock and another scar in left axilla which is well healed. No masses In either breast.   Abdominal:     General: Bowel sounds are normal. There is no distension.     Palpations: Abdomen is soft. There is no hepatomegaly, splenomegaly or mass.     Tenderness: There is no abdominal tenderness. There is no right CVA tenderness, left CVA tenderness, guarding or rebound.     Hernia: No hernia is present.  Musculoskeletal:        General: No swelling, tenderness, deformity or signs of injury. Normal range of motion.     Cervical back: Normal range of motion and neck supple. No rigidity or tenderness.     Right lower leg: No edema.     Left lower leg: No  edema.  Lymphadenopathy:     Cervical: No cervical adenopathy.     Right cervical: No superficial, deep or posterior cervical adenopathy.    Left cervical: No superficial, deep or posterior cervical adenopathy.     Upper Body:     Right upper body: No supraclavicular, axillary or pectoral adenopathy.     Left upper body: No supraclavicular, axillary or pectoral adenopathy.  Skin:    General: Skin is warm and dry.     Coloration: Skin is not jaundiced or pale.     Findings: No bruising, erythema, lesion or rash.  Neurological:     General: No focal deficit present.     Mental Status: She is alert and oriented to person, place, and time. Mental status is at baseline.     Cranial Nerves: No cranial nerve deficit.     Sensory: No sensory deficit.     Motor: No weakness.     Coordination: Coordination normal.     Gait: Gait normal.     Deep Tendon Reflexes: Reflexes normal.  Psychiatric:        Mood and Affect: Mood normal.        Behavior: Behavior normal.        Thought Content: Thought content normal.        Judgment: Judgment normal.     LABS:   Component Ref Range & Units 2 mo ago (10/05/22) 7 mo ago (04/25/22) 1 yr ago (07/19/21) 1 yr ago (07/19/21) 1 yr ago (04/13/21) 1 yr ago (12/21/20) 2 yr ago (11/30/20)  WBC 4.0 - 10.5 K/uL 11.7 High  4.9  4.2 R 4.1 6.0 5.9  RBC 3.87 - 5.11 Mil/uL 4.41 4.22 R 4.35 R  4.39 R 4.41 R 4.16 R  Hemoglobin 12.0 - 15.0 g/dL 16.1 09.6  04.5 R 40.9 13.4 12.8  HCT 36.0 - 46.0 % 40.3 39.2  39 R 40.1 40.8 38.3  MCV 78.0 - 100.0  fl 91.4 92.9 R   91.3 R 92.5 R 92.1 R  MCHC 30.0 - 36.0 g/dL 09.8 11.9   14.7 82.9 56.2  RDW 11.5 - 15.5 % 13.1 12.5   12.3 12.3 12.6  Platelets 150.0 - 400.0 K/uL 234.0 194 R  195 R 182 R 247 R 223 R   Component Ref Range & Units 2 mo ago (10/05/22) 7 mo ago (04/25/22) 1 yr ago (10/03/21)  Sodium 135 - 145 mEq/L 141 140 R 140  Potassium 3.5 - 5.1 mEq/L 3.4 Low  4.3 R 3.8  Chloride 96 - 112 mEq/L 105 106 R 105   CO2 19 - 32 mEq/L 26 26 R 27  Glucose, Bld 70 - 99 mg/dL 71 95 CM 86  BUN 6 - 23 mg/dL 18 16 R 17  Creatinine, Ser 0.40 - 1.20 mg/dL 1.30 8.65 R 7.84  Total Bilirubin 0.2 - 1.2 mg/dL 0.3 0.8 R 0.5  Alkaline Phosphatase 39 - 117 U/L 65 44 R 57  AST 0 - 37 U/L 17 18 R 21  ALT 0 - 35 U/L 31 15 R 16  Total Protein 6.0 - 8.3 g/dL 6.5 6.9 R 6.8  Albumin 3.5 - 5.2 g/dL 4.0 3.9 R 4.2   Component Ref Range & Units 10/05/22 1 yr ago 2 yr ago 3 yr ago 4 yr ago 6 yr ago  Cholesterol 0 - 200 mg/dL 696 High  295 High  CM 218 High  CM 214 High  CM 224 High  CM 193 R  Triglycerides 0.0 - 149.0 mg/dL 284.1 High  324.4 CM 010.2 CM 99.0 CM 94.0 CM 83 R  HDL >39.00 mg/dL 72.53 66.44 03.47 42.59 61.40   VLDL 0.0 - 40.0 mg/dL 56.3 High  87.5 64.3 32.9 18.8   Total CHOL/HDL Ratio 5 3 CM 4 CM 4 CM 4 CM    Component Ref Range & Units 10/05/22 1 yr ago 3 yr ago 6 yr ago  TSH 0.35 - 5.50 uIU/mL 4.05 2.29 1.84 R 1.98 R   Component Ref Range & Units 10/05/22  Direct LDL mg/dL 518.8    STUDIES:  EXAM: 11/16/2022 DIGITAL DIAGNOSTIC BILATERAL MAMMOGRAM WITH TOMOSYNTHESIS IMPRESSION: Stable postsurgical changes in the left breast. No mammographic evidence of malignancy bilaterally.       I,Jasmine M Lassiter,acting as a scribe for [name], MD.,have documented all relevant documentation on the behalf of [name], MD,as directed by  [name], MD while in the presence of [name], MD.  I have reviewed this report as typed by the medical scribe, and it is complete and accurate.

## 2022-12-22 ENCOUNTER — Inpatient Hospital Stay: Payer: Medicare Other | Admitting: Oncology

## 2022-12-28 ENCOUNTER — Encounter: Payer: Self-pay | Admitting: Oncology

## 2022-12-28 ENCOUNTER — Inpatient Hospital Stay: Payer: Medicare Other | Attending: Oncology | Admitting: Oncology

## 2022-12-28 VITALS — BP 104/51 | HR 65 | Temp 97.7°F | Resp 16 | Ht 64.0 in | Wt 148.5 lb

## 2022-12-28 DIAGNOSIS — C50512 Malignant neoplasm of lower-outer quadrant of left female breast: Secondary | ICD-10-CM

## 2022-12-28 DIAGNOSIS — Z170 Estrogen receptor positive status [ER+]: Secondary | ICD-10-CM

## 2022-12-28 NOTE — Progress Notes (Signed)
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 This was treated with lumpectomy and adjuvant radiation. She was placed on hormonal therapy with Tamoxifen in November 2022, and will continue this for a total of 5 years.  Osteoporosis, for which she started alendronate. She will be due for repeat bone density in October 2024, which her PCP will schedule.  Plan:  She continues Tamoxifen 20mg  daily without difficulty. She had a bilateral diagnostic mammogram done on 11/16/2022 that was clear. She is scheduled for repeat DEXA scan in October. Her PCP does routine labs. I will see her back 6 months for reevaluation. She understands and agrees with this plan of care. I have answered her questions and she knows to call with any concerns.  I provided 10 minutes of face-to-face time during this this encounter and > 50% was spent counseling as documented under my assessment and plan.    [name], MD [facility] [address] [place] [address] Dept: [phone] Dept Fax: [phone]   CHIEF COMPLAINT:  CC: Stage IA hormone receptor positive left breast cancer  Current Treatment:  Tamoxifen  HISTORY OF PRESENT ILLNESS:  Katrina Williamson is a 67 y.o. female who presents as a transfer of care from Dr. Darrall Dears office for the continued management and treatment of stage IA (T1b N0 M0) hormone receptor positive invasive lobular carcinoma of the left breast, diagnosed in June 2022. This was found on screening mammogram from May 2022 which showed possible abnormality in the left breast. Diagnostic imaging from June confirmed a 0.9 mm mass in the left breast at 3:30 o'clock. No adenopathy was  identified. Biopsy was pursued on June 14th and pathology revealed invasive mammary carcinoma, grade 2, and mammary carcinoma in situ. Estrogen receptor was positive at 95% and progesterone receptor was positive at 90%. HER2 was negative 1+. Ki67 was 1%. Bilateral breast MRI from July revealed a biopsy proven malignancy measuring 1.1 cm within the left breast. Right breast was negative. She underwent lumpectomy with Dr. Donell Beers on July 27th and final pathology from this procedure confirmed invasive lobular carcinoma, grade 2, 1.4 cm, lobular neoplasia. All margins were negative. Two sentinel lymph nodes were negative for carcinoma (0/2). She did have some mild complications after her procedure with opening of the incisions, which required packing. Oncotype revealed a recurrence score of 22, low risk, and predicts a risk of recurrence outside the breast over the next 9 years of 8%, if the patient's only systemic therapy is an antiestrogen for 5 years.  It also predicted  no benefit from chemotherapy. She received adjuvant radiation to the left breast, and was started on adjuvant endocrine therapy with tamoxifen in November 2022, with plans of 5 years of therapy.   INTERVAL HISTORY:  I have reviewed her chart and materials related to her cancer extensively and collaborated history with the patient. Summary of oncologic history is as follows: Oncology History  Malignant neoplasm of lower-outer quadrant of left breast of female, estrogen receptor positive (HCC)  11/29/2020 Initial Diagnosis   Malignant neoplasm of lower-outer quadrant of left breast of female, estrogen receptor positive (HCC)   11/30/2020 Cancer Staging   Staging form: Breast, AJCC 8th Edition - Clinical: Stage IA (cT1b,  cN0, cM0, G2, ER+, PR+, HER2-) - Signed by Lowella Dell, MD on 11/30/2020 Histologic grading system: 3 grade system   12/29/2020 Surgery   Left breast lumpectomy with sentinel lymph node. Two lymph nodes biopsied, both  negative for carcinoma.    Radiation Therapy   03/08/2021 through 04/04/2021 Site Technique Total Dose (Gy) Dose per Fx (Gy) Completed Fx Beam Energies  Breast, Left: Breast_Lt 3D 42.56/42.56 2.66 16/16 6X  Breast, Left: Breast_Lt_Bst 3D 8/8 2 4/4 6X     Surgery       INTERVAL HISTORY:  Katrina Williamson is here today for repeat clinical assessment of stage stage IA (T1b N0 M0) hormone receptor positive left breast cancer. She was diagnosed in June, 2022. Patient states that she feels well and complains of aching of her neck. She continues Tamoxifen 20mg  daily without difficulty. She had a bilateral diagnostic mammogram done on 11/16/2022 that was clear. She is scheduled for repeat DEXA scan in October. Her PCP does routine labs. I will see her back 6 months for reevaluation. She denies signs of infection such as sore throat, sinus drainage, cough, or urinary symptoms.  She denies fevers or recurrent chills. She denies pain. She denies nausea, vomiting, chest pain, dyspnea or cough. Her appetite is good and her weight has decreased 1 pounds over last 1.5 months .   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  See old records   Osteoporosis    Postmenopausal HRT (hormone replacement therapy) 03/14/2018   Menopause at age 72;    Past Surgical History:  Procedure Laterality Date   BREAST LUMPECTOMY WITH RADIOACTIVE SEED AND SENTINEL LYMPH NODE BIOPSY Left 12/29/2020   Procedure: LEFT BREAST LUMPECTOMY WITH RADIOACTIVE SEED;  Surgeon: Almond Lint, MD;  Location: [facility] OR;  Service: General;  Laterality: Left;   SENTINEL NODE BIOPSY Left 12/29/2020   Procedure: LEFT  AXILLARY SENTINEL NODE BIOPSY;  Surgeon: Almond Lint, MD;  Location: [facility] OR;  Service: General;  Laterality: Left;   WISDOM TOOTH EXTRACTION     Family History  Problem Relation Age of Onset   Arthritis Mother    Hearing loss Mother    Osteoporosis Mother        significant   Stroke Father    Alcohol abuse Sister    Arthritis Sister    Cancer Sister        uterine; 60s   Social History:  reports that she has never smoked. She has never used smokeless tobacco. She reports current alcohol use of about 2.0 standard drinks of alcohol per week. She reports that she does not currently use drugs after having used the following drugs: Marijuana. She is divorced and lives at home. She is nulliparous. 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  No Known Allergies  Current Medications: Current Outpatient Medications  Medication Sig Dispense Refill   alendronate (FOSAMAX) 70 MG tablet Take 1 tablet (70 mg total) by mouth every 7 (seven) days. Take with a full glass of water on an empty  stomach. 12 tablet 3   fluticasone (FLONASE) 50 MCG/ACT nasal spray Place 2 sprays into both nostrils daily.     sertraline (ZOLOFT) 50 MG tablet Take 1 tablet (50 mg total) by mouth at bedtime. 90 tablet 3   tamoxifen (NOLVADEX) 20 MG tablet Take 1 tablet (20 mg total) by mouth daily. 90 tablet 3   No current facility-administered medications for this visit.    REVIEW OF SYSTEMS:  Review of Systems  Constitutional: Negative.  Negative for appetite change, chills, diaphoresis, fatigue, fever and unexpected weight change.  HENT:  Negative.  Negative for hearing loss, lump/mass, mouth sores, nosebleeds, sore throat, tinnitus, trouble swallowing and voice change.   Eyes: Negative.  Negative for eye problems and icterus.  Respiratory: Negative.  Negative for chest tightness, cough, hemoptysis, shortness of breath and wheezing.   Cardiovascular: Negative.   Negative for chest pain (Occasional pain of the lumpectomy and axillary sites), leg swelling and palpitations.  Gastrointestinal: Negative.  Negative for abdominal distention, abdominal pain, blood in stool, constipation, diarrhea, nausea, rectal pain and vomiting.  Endocrine: Negative.   Genitourinary:  Negative for bladder incontinence, difficulty urinating, dyspareunia, dysuria, frequency, hematuria, menstrual problem, nocturia, pelvic pain, vaginal bleeding and vaginal discharge.   Musculoskeletal:  Positive for neck pain (aching). Negative for arthralgias, back pain, flank pain, gait problem, myalgias and neck stiffness.  Skin: Negative.  Negative for itching, rash and wound.  Neurological:  Negative for dizziness, extremity weakness, gait problem, headaches, light-headedness, numbness, seizures and speech difficulty.  Hematological: Negative.  Negative for adenopathy. Does not bruise/bleed easily.  Psychiatric/Behavioral: Negative.  Negative for confusion, decreased concentration, depression, sleep disturbance and suicidal ideas. The patient is not nervous/anxious.    VITALS:  Blood pressure (!) 117/59, pulse 70, temperature 98 F (36.7 C), temperature source Oral, resp. rate 16, height 5\' 4"  (1.626 m), weight 148 lb (67.1 kg), SpO2 96 %.  Wt Readings from Last 3 Encounters:  01/17/22 148 lb (67.1 kg)  10/17/21 146 lb 3.2 oz (66.3 kg)  10/03/21 147 lb (66.7 kg)    Body mass index is 25.4 kg/m.  Performance status (ECOG): 0 - Asymptomatic  PHYSICAL EXAM:  Physical Exam Vitals and nursing note reviewed.  Constitutional:      General: She is not in acute distress.    Appearance: Normal appearance. She is normal weight. She is not ill-appearing, toxic-appearing or diaphoretic.  HENT:     Head: Normocephalic and atraumatic.     Right Ear: Tympanic membrane, ear canal and external ear normal. There is no impacted cerumen.     Left Ear: Tympanic membrane, ear canal and external ear  normal. There is no impacted cerumen.     Nose: Nose normal. No congestion or rhinorrhea.     Mouth/Throat:     Mouth: Mucous membranes are moist.     Pharynx: Oropharynx is clear. No oropharyngeal exudate or posterior oropharyngeal erythema.  Eyes:     General: No scleral icterus.       Right eye: No discharge.        Left eye: No discharge.     Extraocular Movements: Extraocular movements intact.     Conjunctiva/sclera: Conjunctivae normal.     Pupils: Pupils are equal, round, and reactive to light.  Neck:     Vascular: No carotid bruit.  Cardiovascular:     Rate and Rhythm: Normal rate and regular rhythm.     Pulses: Normal pulses.     Heart sounds: Normal heart sounds.  No murmur heard.    No friction rub. No gallop.  Pulmonary:     Effort: Pulmonary effort is normal. No respiratory distress.     Breath sounds: Normal breath sounds. No stridor. No wheezing, rhonchi or rales.  Chest:     Chest wall: No tenderness.     Comments: A faded scar in the lateral left breast at about 3 o'clock.  Left axillary incision is well healed. No masses in either breast.   Abdominal:     General: Bowel sounds are normal. There is no distension.     Palpations: Abdomen is soft. There is no hepatomegaly, splenomegaly or mass.     Tenderness: There is no abdominal tenderness. There is no right CVA tenderness, left CVA tenderness, guarding or rebound.     Hernia: No hernia is present.  Musculoskeletal:        General: No swelling, tenderness, deformity or signs of injury. Normal range of motion.     Cervical back: Normal range of motion and neck supple. No rigidity or tenderness.     Right lower leg: No edema.     Left lower leg: No edema.  Lymphadenopathy:     Cervical: No cervical adenopathy.     Right cervical: No superficial, deep or posterior cervical adenopathy.    Left cervical: No superficial, deep or posterior cervical adenopathy.     Upper Body:     Right upper body: No  supraclavicular, axillary or pectoral adenopathy.     Left upper body: No supraclavicular, axillary or pectoral adenopathy.  Skin:    General: Skin is warm and dry.     Coloration: Skin is not jaundiced or pale.     Findings: No bruising, erythema, lesion or rash.  Neurological:     General: No focal deficit present.     Mental Status: She is alert and oriented to person, place, and time. Mental status is at baseline.     Cranial Nerves: No cranial nerve deficit.     Sensory: No sensory deficit.     Motor: No weakness.     Coordination: Coordination normal.     Gait: Gait normal.     Deep Tendon Reflexes: Reflexes normal.  Psychiatric:        Mood and Affect: Mood normal.        Behavior: Behavior normal.        Thought Content: Thought content normal.        Judgment: Judgment normal.      LABS:    Component Ref Range & Units 2 mo ago (10/05/22) 8 mo ago (04/25/22)  WBC 4.0 - 10.5 K/uL 11.7 High  4.9  RBC 3.87 - 5.11 Mil/uL 4.41 4.22 R  Hemoglobin 12.0 - 15.0 g/dL 16.1 09.6  HCT 04.5 - 40.9 % 40.3 39.2  MCV 78.0 - 100.0 fl 91.4 92.9 R  MCHC 30.0 - 36.0 g/dL 81.1 91.4  RDW 78.2 - 95.6 % 13.1 12.5  Platelets 150.0 - 400.0 K/uL 234.0 194 R      Component Ref Range & Units 2 mo ago (10/05/22) 8 mo ago (04/25/22) 1 yr ago (10/03/21)  Sodium 135 - 145 mEq/L 141 140 R 140  Potassium 3.5 - 5.1 mEq/L 3.4 Low  4.3 R 3.8  Chloride 96 - 112 mEq/L 105 106 R 105  CO2 19 - 32 mEq/L 26 26 R 27  Glucose, Bld 70 - 99 mg/dL 71 95 CM 86  BUN 6 - 23 mg/dL 18 16  R 17  Creatinine, Ser 0.40 - 1.20 mg/dL 6.06 3.01 R 6.01  Total Bilirubin 0.2 - 1.2 mg/dL 0.3 0.8 R 0.5  Alkaline Phosphatase 39 - 117 U/L 65 44 R 57  AST 0 - 37 U/L 17 18 R 21  ALT 0 - 35 U/L 31 15 R 16  Total Protein 6.0 - 8.3 g/dL 6.5 6.9 R 6.8     Component Ref Range & Units 10/05/22 1 yr ago 2 yr ago  Cholesterol 0 - 200 mg/dL 093 High  235 High  CM 218 High  CM  Triglycerides 0.0 - 149.0 mg/dL  573.2 High  202.5 CM 427.0 CM  HDL >39.00 mg/dL 62.37 62.83 15.17  VLDL 0.0 - 40.0 mg/dL 61.6 High  07.3 71.0  Total CHOL/HDL Ratio 5 3 CM 4 CM  NonHDL 166.26 166.74 CM 162.86 CM   Component Ref Range & Units 10/05/22  Direct LDL mg/dL 626.9   omponent Ref Range & Units 10/05/22 1 yr ago 3 yr ago  TSH 0.35 - 5.50 uIU/mL 4.05 2.29 1.84 R    STUDIES:  EXAM: 11/16/2022 DIGITAL DIAGNOSTIC BILATERAL MAMMOGRAM WITH TOMOSYNTHESIS IMPRESSION:  No evidence of malignancy in either breast.      I,Jasmine M Lassiter,acting as a scribe for [name], MD.,have documented all relevant documentation on the behalf of [name], MD,as directed by  [name], MD while in the presence of [name], MD.  I have reviewed this report as typed by the medical scribe, and it is complete and accurate.

## 2023-01-19 DIAGNOSIS — J029 Acute pharyngitis, unspecified: Secondary | ICD-10-CM | POA: Diagnosis not present

## 2023-01-19 DIAGNOSIS — J069 Acute upper respiratory infection, unspecified: Secondary | ICD-10-CM | POA: Diagnosis not present

## 2023-01-21 ENCOUNTER — Other Ambulatory Visit: Payer: Self-pay

## 2023-01-21 ENCOUNTER — Emergency Department (HOSPITAL_COMMUNITY)
Admission: EM | Admit: 2023-01-21 | Discharge: 2023-01-21 | Disposition: A | Discharge status: Home / Self Care | Payer: Medicare Other | Attending: Emergency Medicine | Admitting: Emergency Medicine

## 2023-01-21 DIAGNOSIS — I951 Orthostatic hypotension: Secondary | ICD-10-CM | POA: Diagnosis not present

## 2023-01-21 DIAGNOSIS — U071 COVID-19: Secondary | ICD-10-CM | POA: Insufficient documentation

## 2023-01-21 DIAGNOSIS — R059 Cough, unspecified: Secondary | ICD-10-CM | POA: Diagnosis present

## 2023-01-21 LAB — URINALYSIS, ROUTINE W REFLEX MICROSCOPIC
Bilirubin Urine: NEGATIVE
Glucose, UA: NEGATIVE mg/dL
Hgb urine dipstick: NEGATIVE
Ketones, ur: 5 mg/dL — AB
Nitrite: NEGATIVE
Protein, ur: NEGATIVE mg/dL
Specific Gravity, Urine: 1.024 (ref 1.005–1.030)
pH: 5 (ref 5.0–8.0)

## 2023-01-21 LAB — CBC
HCT: 37.5 % (ref 36.0–46.0)
Hemoglobin: 12.1 g/dL (ref 12.0–15.0)
MCH: 29.7 pg (ref 26.0–34.0)
MCHC: 32.3 g/dL (ref 30.0–36.0)
MCV: 91.9 fL (ref 80.0–100.0)
Platelets: 158 10*3/uL (ref 150–400)
RBC: 4.08 MIL/uL (ref 3.87–5.11)
RDW: 12.9 % (ref 11.5–15.5)
WBC: 4.9 10*3/uL (ref 4.0–10.5)
nRBC: 0 % (ref 0.0–0.2)

## 2023-01-21 LAB — BASIC METABOLIC PANEL
Anion gap: 12 (ref 5–15)
BUN: 12 mg/dL (ref 8–23)
CO2: 24 mmol/L (ref 22–32)
Calcium: 8.5 mg/dL — ABNORMAL LOW (ref 8.9–10.3)
Chloride: 103 mmol/L (ref 98–111)
Creatinine, Ser: 1.07 mg/dL — ABNORMAL HIGH (ref 0.44–1.00)
GFR, Estimated: 57 mL/min — ABNORMAL LOW (ref >60)
Glucose, Bld: 175 mg/dL — ABNORMAL HIGH (ref 70–99)
Potassium: 3.7 mmol/L (ref 3.5–5.1)
Sodium: 139 mmol/L (ref 135–145)

## 2023-01-21 LAB — CBG MONITORING, ED: Glucose-Capillary: 104 mg/dL — ABNORMAL HIGH (ref 70–99)

## 2023-01-21 LAB — SARS CORONAVIRUS 2 BY RT PCR: SARS Coronavirus 2 by RT PCR: POSITIVE — AB

## 2023-01-21 MED ORDER — PAXLOVID (300/100) 20 X 150 MG & 10 X 100MG PO TBPK: 3.0000 | ORAL_TABLET | Freq: Two times a day (BID) | ORAL | 0 refills | Status: AC

## 2023-01-21 MED ORDER — ONDANSETRON 4 MG PO TBDP: 4.0000 mg | ORAL_TABLET | Freq: Three times a day (TID) | ORAL | 0 refills | Status: DC | PRN

## 2023-01-21 MED ORDER — SODIUM CHLORIDE 0.9 % IV BOLUS
1000.0000 mL | Freq: Once | INTRAVENOUS | Status: AC
  Administered 2023-01-21: 1000 mL via INTRAVENOUS

## 2023-01-21 NOTE — ED Triage Notes (Signed)
Pt. Stated, Im really dizzy,and weak and have congestion with a cough, this started Thursday morning. My husband tested positive on Friday informed this morning, I tested myself on Thursday night and it was negative, but my symptoms are making feel its not a true test results.

## 2023-01-21 NOTE — Discharge Instructions (Addendum)
Please read and follow all provided instructions.  Your diagnoses today include:  1. COVID-19   2. Orthostatic hypotension     Tests performed today include: Vital signs. See below for your results today.  Complete blood cell count:  Basic metabolic panel:  Urinalysis (urine test): Suggests mild dehydration COVID test - pending, check mychart for results  Medications prescribed:  Paxlovid  Zofran (ondansetron) - for nausea and vomiting  Take any prescribed medications only as directed. Treatment for your infection is aimed at treating the symptoms. There are no medications, such as antibiotics, that will cure your infection.   Home care instructions:  Follow any educational materials contained in this packet.   Your illness is contagious and can be spread to others, especially during the first 3 or 4 days. It cannot be cured by antibiotics or other medicines. Take basic precautions such as washing your hands often, covering your mouth when you cough or sneeze, and avoiding public places where you could spread your illness to others.   Please continue drinking plenty of fluids.  Use over-the-counter medicines as needed as directed on packaging for symptom relief.  You may also use ibuprofen or tylenol as directed on packaging for pain or fever.  Do not take multiple medicines containing Tylenol or acetaminophen to avoid taking too much of this medication.  If you are positive for Covid-19, you should isolate yourself and not be exposed to other people for 5 days after your symptoms began. If you are not feeling better at day 5, you need to isolate yourself for a total of 10 days. If you are feeling better by day 5, you should wear a mask properly, over your nose and mouth, at all times while around other people until 10 days after your symptoms started.   Follow-up instructions: Please follow-up with your primary care provider as needed for further evaluation of your symptoms if you are  not feeling better.   Return instructions:  Please return to the Emergency Department if you experience worsening symptoms.  Return to the emergency department if you have worsening shortness of breath breathing or increased work of breathing, persistent vomiting RETURN IMMEDIATELY IF you develop shortness of breath, confusion or altered mental status, a new rash, become dizzy, faint, or poorly responsive, or are unable to be cared for at home. Please return if you have persistent vomiting and cannot keep down fluids or develop a fever that is not controlled by tylenol or motrin.   Please return if you have any other emergent concerns.  Additional Information:  Your vital signs today were: BP 106/64   Pulse 80   Temp 98.4 F (36.9 C) (Oral)   Resp 18   Ht 5\' 4"  (1.626 m)   Wt 65.8 kg   SpO2 96%   BMI 24.89 kg/m  If your blood pressure (BP) was elevated above 135/85 this visit, please have this repeated by your doctor within one month. --------------

## 2023-01-21 NOTE — ED Provider Notes (Signed)
[place] EMERGENCY DEPARTMENT AT [facility] Provider Note   CSN: [id number] Arrival date & time: 01/21/23  1118     History  Chief Complaint  Patient presents with   Dizziness   Weakness   Cough   Nasal Congestion    Katrina Williamson is a 67 y.o. female.  Patient with history of breast cancer on tamoxifen presents to the emergency department today for evaluation of flulike illness.  Symptoms started 3 days ago.  Patient's husband tested positive for COVID, but she had a negative home test.  She has had a congested cough, occasional coughing only.  No shortness of breath.  She has had lightheadedness, especially with standing and fatigue.  No vomiting or diarrhea but has had decreased appetite.  No lower extremity swelling.  No cardiac history.       Home Medications Prior to Admission medications   Medication Sig Start Date End Date Taking? Authorizing Provider  alendronate (FOSAMAX) 70 MG tablet TAKE 1 TABLET (70 MG TOTAL) BY MOUTH EVERY 7 DAYS WITH FULL GLASS WATER ON EMPTY STOMACH 05/30/22   Willow Ora, MD  fluticasone [facility]) 50 MCG/ACT nasal spray Place 2 sprays into both nostrils daily. 07/18/22   Willow Ora, MD  sertraline (ZOLOFT) 50 MG tablet Take 1 tablet (50 mg total) by mouth at bedtime. 10/05/22   Willow Ora, MD  tamoxifen (NOLVADEX) 20 MG tablet Take 1 tablet (20 mg total) by mouth daily. 11/29/22   [name], MD  triamcinolone cream (KENALOG) 0.1 % Apply small amount to ear canal on Q-tip daily for up to 2 weeks, then as needed. 06/29/22   Willow Ora, MD      Allergies    Patient has no known allergies.    Review of Systems   Review of Systems  Physical Exam Updated Vital Signs BP (!) 98/56   Pulse 80   Temp 98.4 F (36.9 C) (Oral)   Resp 16   Ht 5\' 4"  (1.626 m)   Wt 65.8 kg   SpO2 96%   BMI 24.89 kg/m   Physical Exam Vitals and nursing note reviewed.  Constitutional:      Appearance: She is  well-developed.  HENT:     Head: Normocephalic and atraumatic.     Jaw: No trismus.     Right Ear: Tympanic membrane, ear canal and external ear normal.     Left Ear: Tympanic membrane, ear canal and external ear normal.     Nose: Nose normal. No mucosal edema or rhinorrhea.     Mouth/Throat:     Mouth: Mucous membranes are moist. Mucous membranes are not dry. No oral lesions.     Pharynx: Uvula midline. No oropharyngeal exudate, posterior oropharyngeal erythema or uvula swelling.     Tonsils: No tonsillar abscesses.  Eyes:     General:        Right eye: No discharge.        Left eye: No discharge.     Conjunctiva/sclera: Conjunctivae normal.  Cardiovascular:     Rate and Rhythm: Normal rate and regular rhythm.     Heart sounds: Normal heart sounds.  Pulmonary:     Effort: Pulmonary effort is normal. No respiratory distress.     Breath sounds: Examination of the right-lower field reveals decreased breath sounds. Decreased breath sounds and wheezing present. No rales.     Comments: Minimal expiratory wheezing, lung sounds diminished on the right base.  No accessory  muscle use. Abdominal:     Palpations: Abdomen is soft.     Tenderness: There is no abdominal tenderness.  Musculoskeletal:     Cervical back: Normal range of motion and neck supple.  Lymphadenopathy:     Cervical: No cervical adenopathy.  Skin:    General: Skin is warm and dry.  Neurological:     Mental Status: She is alert.  Psychiatric:        Mood and Affect: Mood normal.     ED Results / Procedures / Treatments   Labs (all labs ordered are listed, but only abnormal results are displayed) Labs Reviewed  SARS CORONAVIRUS 2 BY RT PCR - Abnormal; Notable for the following components:      Result Value   SARS Coronavirus 2 by RT PCR POSITIVE (*)    All other components within normal limits  BASIC METABOLIC PANEL - Abnormal; Notable for the following components:   Glucose, Bld 175 (*)    Creatinine, Ser 1.07  (*)    Calcium 8.5 (*)    GFR, Estimated 57 (*)    All other components within normal limits  URINALYSIS, ROUTINE W REFLEX MICROSCOPIC - Abnormal; Notable for the following components:   APPearance HAZY (*)    Ketones, ur 5 (*)    Leukocytes,Ua SMALL (*)    Bacteria, UA RARE (*)    All other components within normal limits  CBG MONITORING, ED - Abnormal; Notable for the following components:   Glucose-Capillary 104 (*)    All other components within normal limits  CBC    ED ECG REPORT   Date: 01/21/2023  Rate: 75  Rhythm: normal sinus rhythm  QRS Axis: normal  Intervals: normal  ST/T Wave abnormalities: nonspecific T wave changes  Conduction Disutrbances:none  Narrative Interpretation: No signs of arrhythmia or heart block, prolonged QT, WPW, Brugada syndrome  Old EKG Reviewed: none available  I have personally reviewed the EKG tracing and agree with the computerized printout as noted.   Radiology No results found.  Procedures Procedures    Medications Ordered in ED Medications  sodium chloride 0.9 % bolus 1,000 mL (1,000 mLs Intravenous New Bag/Given 01/21/23 1229)    ED Course/ Medical Decision Making/ A&P    Patient seen and examined. History obtained directly from patient. Work-up including labs, imaging, EKG ordered in triage, if performed, were reviewed.    Labs/EKG: Independently reviewed and interpreted.  This included: CBC unremarkable; BMP with elevated glucose, creatinine minimally elevated at 1.07; UA without signs of infection; COVID-positive.  Imaging: None ordered  Medications/Fluids: Ordered: IV fluid bolus  Most recent vital signs reviewed and are as follows: BP (!) 98/56   Pulse 80   Temp 98.4 F (36.9 C) (Oral)   Resp 16   Ht 5\' 4"  (1.626 m)   Wt 65.8 kg   SpO2 96%   BMI 24.89 kg/m   Initial impression: Constitutional symptoms in setting of COVID-19 infection.  EKG personally reviewed and interpreted, no ischemic findings.  Patient  will be treated symptomatically with IV fluids given her lightheadedness.  Overall she looks well, nontoxic.  Anticipate discharged home.  She is interested in Paxlovid.  Reviewed interactions with tamoxifen and this should not be a concern.  Kidney function okay.  2:30 PM Reassessment performed. Patient appears well.  She is feeling better after IV fluids.  Labs personally reviewed and interpreted including: EKG with nonspecific changes, no old for comparison, low concern for ACS today.    Reviewed  pertinent lab work and imaging with patient at bedside. Questions answered.   Most current vital signs reviewed and are as follows: BP 106/64   Pulse 80   Temp 98.4 F (36.9 C) (Oral)   Resp 18   Ht 5\' 4"  (1.626 m)   Wt 65.8 kg   SpO2 96%   BMI 24.89 kg/m   Plan: Discharge to home.   Prescriptions written for: Paxlovid per patient request, Zofran for GI intolerance side effects.  Detailed discussion had with with patient regarding COVID-19 precautions and written instructions given as well.  We discussed need to isolate themselves for 5 days from onset of symptoms and have 24 hours of improvement prior to breaking isolation.  We discussed that when breaking isolation, mask wearing for 5 additional days is required.  We discussed signs symptoms to return which include worsening shortness of breath, trouble breathing, or increased work of breathing.  Also return with persistent vomiting, confusion, passing out, or if they have any other concerns. Counseled on the need for rest and good hydration. Discussed that high-risk contacts should be aware of positive result and they need to quarantine and be tested if they develop any symptoms. Patient verbalizes understanding.                                 Medical Decision Making Amount and/or Complexity of Data Reviewed Labs: ordered.  Risk Prescription drug management.   Patient was lightheadedness and constitutional symptoms suspected due to  COVID infection.  She has had some decreased appetite and possible mild dehydration.  Improved with IV fluids.  Low concern for sepsis.  Patient appears very well, nontoxic.  She would like to try Paxlovid.  No contraindications.  Lab workup is reassuring today.   The patient's vital signs, pertinent lab work and imaging were reviewed and interpreted as discussed in the ED course. Hospitalization was considered for further testing, treatments, or serial exams/observation. However as patient is well-appearing, has a stable exam, and reassuring studies today, I do not feel that they warrant admission at this time. This plan was discussed with the patient who verbalizes agreement and comfort with this plan and seems reliable and able to return to the Emergency Department with worsening or changing symptoms.          Final Clinical Impression(s) / ED Diagnoses Final diagnoses:  COVID-19  Orthostatic hypotension    Rx / DC Orders ED Discharge Orders          Ordered    nirmatrelvir & ritonavir (PAXLOVID, 300/100,) 20 x 150 MG & 10 x 100MG  TBPK  2 times daily        01/21/23 1426    ondansetron (ZOFRAN-ODT) 4 MG disintegrating tablet  Every 8 hours PRN        01/21/23 1426              Renne Crigler, PA-C 01/21/23 1432    Arby Barrette, MD 01/24/23 [id number]

## 2023-01-29 ENCOUNTER — Telehealth: Payer: Self-pay

## 2023-01-29 NOTE — Telephone Encounter (Signed)
Transition Care Management Unsuccessful Follow-up Telephone Call  Date of discharge and from where:  [facility]  Attempts:  1st Attempt  Reason for unsuccessful TCM follow-up call:  No answer/busy   Lenard Forth [place]  [facility], [facility] Guide, Phone: [phone] Website: [email or web address]

## 2023-01-30 ENCOUNTER — Telehealth: Payer: Self-pay

## 2023-01-30 NOTE — Telephone Encounter (Signed)
Transition Care Management Unsuccessful Follow-up Telephone Call  Date of discharge and from where:  [facility]  Attempts:  2nd Attempt  Reason for unsuccessful TCM follow-up call:  No answer/busy   Lenard Forth   [facility], [facility] Guide, Phone: [phone] Website: [email or web address]

## 2023-04-05 DIAGNOSIS — S80219A Abrasion, unspecified knee, initial encounter: Secondary | ICD-10-CM | POA: Diagnosis not present

## 2023-04-05 DIAGNOSIS — R001 Bradycardia, unspecified: Secondary | ICD-10-CM | POA: Diagnosis not present

## 2023-04-05 DIAGNOSIS — R9431 Abnormal electrocardiogram [ECG] [EKG]: Secondary | ICD-10-CM | POA: Diagnosis not present

## 2023-04-05 DIAGNOSIS — R55 Syncope and collapse: Secondary | ICD-10-CM | POA: Diagnosis not present

## 2023-04-05 DIAGNOSIS — W19XXXA Unspecified fall, initial encounter: Secondary | ICD-10-CM | POA: Diagnosis not present

## 2023-04-05 DIAGNOSIS — Z23 Encounter for immunization: Secondary | ICD-10-CM | POA: Diagnosis not present

## 2023-04-05 DIAGNOSIS — R0781 Pleurodynia: Secondary | ICD-10-CM | POA: Diagnosis not present

## 2023-04-10 ENCOUNTER — Encounter: Payer: Self-pay | Admitting: Family Medicine

## 2023-04-10 ENCOUNTER — Ambulatory Visit (INDEPENDENT_AMBULATORY_CARE_PROVIDER_SITE_OTHER): Payer: Medicare Other | Admitting: Family Medicine

## 2023-04-10 VITALS — BP 116/80 | HR 73 | Temp 98.2°F | Ht 64.0 in | Wt 148.4 lb

## 2023-04-10 DIAGNOSIS — M47812 Spondylosis without myelopathy or radiculopathy, cervical region: Secondary | ICD-10-CM | POA: Diagnosis not present

## 2023-04-10 DIAGNOSIS — R55 Syncope and collapse: Secondary | ICD-10-CM | POA: Diagnosis not present

## 2023-04-10 DIAGNOSIS — S20212D Contusion of left front wall of thorax, subsequent encounter: Secondary | ICD-10-CM

## 2023-04-10 DIAGNOSIS — W19XXXD Unspecified fall, subsequent encounter: Secondary | ICD-10-CM | POA: Diagnosis not present

## 2023-04-10 NOTE — Progress Notes (Signed)
Subjective  CC:  Chief Complaint  Patient presents with   Fall    Pt stated that she fell at home on 04/05/2023 at home.and also fainted.Ambulance came and they drove to hospital [facility].    HPI: Katrina Williamson is a 67 y.o. female who presents to the office today to address the problems listed above in the chief complaint. Discussed the use of AI scribe software for clinical note transcription with the patient, who gave verbal consent to proceed.  History of Present Illness   The patient, with a history of breast cancer, presented with a recent fall and subsequent fainting episode. The fall occurred when the patient was exiting her vehicle to open a gate, during which she tripped, presumably over her shoelaces. The patient fell hard onto her chest and knees, but did not hit her head. She recalls the fall clearly and did not lose consciousness at this point. After the fall, the patient rested for a few minutes before getting back into the car. she has video of the fall, recorded by her Tesla. I reviewed it.  Upon exiting the car at her home, the patient experienced severe knee pain and appeared disoriented and unsteady on her feet, as if intoxicated. Shortly after, she lost consciousness. During this episode, the patient was stiff and unresponsive, although her eyes were open. She was able to respond minimally to her spouse's questions, but her gaze was unfocused. The patient does not recall this episode. the video confirms a fainting episode w/o seizure activity. her husband caught her and laid her gently to the floor.   Following the fainting episode, the patient was taken to the emergency room. Her blood pressure was noted to decrease with positional changes, dropping to as low as 90. However, by the time she reached the ER, her blood pressure had stabilized. The patient's workup in the ER was largely unremarkable, although an EKG showed a non-specific abnormality. I reviewed ER notes:  normal CXR, cbc, cmp, trop, EKG showed old ant q wave and sinus brady.   In the days following the incident, the patient reported persistent soreness, particularly in the chest area. She also reported feeling lightheaded and experiencing stiffness and pain in the neck, which she attributes to pre-existing arthritis. The patient has a history of bulging discs in the neck and lower back, and her neck pain and stiffness have been worsening over time. She manages her pain with Advil and has not sought recent orthopedic consultation for these issues.  The patient also has a history of breast cancer and has markers in her breast from previous treatment. She expressed concern about the impact of the fall on these markers and the potential for cancer recurrence. However, she did not report any new symptoms related to her breast cancer history. The patient plans to discuss this concern at her next oncology appointment.     Results RADIOLOGY MRI: Bulging discs in cervical and lumbar spine (2004) MRI: Mild degenerative changes in lumbar spine (2009)  DIAGNOSTIC EKG: Abnormal (04/01/2023) nonspecific ant q wave changes. no copy or comparison available. Assessment  1. Fall, subsequent encounter   2. Chest wall contusion, left, subsequent encounter   3. Syncope, vasovagal   4. Spondylosis of cervical region without myelopathy or radiculopathy      Plan  Assessment and Plan    Vasovagal Syncope Experienced a vasovagal episode following a fall, resulting in significant pain and a subsequent drop in blood pressure. Emergency room evaluation showed  no significant abnormalities. Orthostatic hypotension noted, consistent with a vasovagal response. No neurological issues or stroke signs. - Monitor blood pressure - Encourage hydration and proper nutrition - Advise gradual movements during recovery - Consider checking another blood count for stability  Chest Wall Contusion Sustained a chest wall contusion  from a fall, causing significant soreness and pain. No signs of internal bleeding or other complications. Explained prolonged pain due to nerve bundles under each rib. 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 Reports neck stiffness and pain. Previous MRIs show bulging discs. Discussed osteoarthritis-related bony changes and reduced joint mobility. Advised non-pharmacological measures. - Recommend extra strength Tylenol, one or two in the morning and at night - Use Advil for severe pain - Encourage range of motion exercises and use of heating pads - Advise on proper posture and core exercises - Consider physical therapy if symptoms persist - Order cervical x-rays if needed for further evaluation  General Health Maintenance Up to date with immunizations. Plans to receive flu shot and COVID-19 booster in December. - Administer flu shot and COVID-19 booster in December  Follow-up - Discuss recent fall and potential impact on breast cancer markers with oncologist at next appointment.        Follow up: may 2025 for cpe Visit date not found  No orders of the defined types were placed in this encounter.  No orders of the defined types were placed in this encounter.     I reviewed the patients updated PMH, FH, and SocHx.    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   sertraline (ZOLOFT) 50 MG tablet Take 1 tablet (50 mg total) by mouth at bedtime.   tamoxifen (NOLVADEX) 20 MG tablet Take 1 tablet (20 mg total) by mouth daily.    Allergies: Patient has No Known Allergies. Family History: Patient family history includes Alcohol abuse in her sister; Arthritis in her mother and sister; Cancer in her sister; Hearing loss in her mother; Osteoporosis in her mother; Stroke in her father. Social History:  Patient  reports that she has never smoked. She has never used smokeless tobacco. She reports current alcohol use of about 2.0 standard drinks of alcohol per week. She reports that she does not currently use drugs after having used the following drugs: Marijuana.  Review of Systems: Constitutional: Negative for fever malaise or anorexia Cardiovascular: negative for chest pain, + left chest wall pain Respiratory: negative for SOB or persistent cough Gastrointestinal: negative for abdominal pain + neck stiffnedss  Objective  Vitals: BP 116/80   Pulse 73   Temp  98.2 F (36.8 C)   Ht 5\' 4"  (1.626 m)   Wt 148 lb 6.4 oz (67.3 kg)   SpO2 98%   BMI 25.47 kg/m  General: no acute distress , A&Ox3 HEENT: PEERL, conjunctiva normal, neck is supple w/ fairly good range of motion Cardiovascular:  RRR without murmur or gallop.  Respiratory:  Good breath sounds bilaterally, CTAB with normal respiratory effort, left lower chest wall ttp w/o crepitus Gastrointestinal: soft, flat abdomen, normal active bowel sounds, no palpable masses, no hepatosplenomegaly, no appreciated hernias nontender Skin:  Warm, no  rashes   Commons side effects, risks, benefits, and alternatives for medications and treatment plan prescribed today were discussed, and the patient expressed understanding of the given instructions. Patient is instructed to call or message via MyChart if he/she has any questions or concerns regarding our treatment plan. No barriers to understanding were identified. We discussed Red Flag symptoms and signs in detail. Patient expressed understanding regarding what to do in case of urgent or emergency type symptoms.  Medication list was reconciled, printed and provided to the patient in AVS. Patient instructions and summary information was reviewed with the patient as documented in the AVS. This note was prepared with assistance of Dragon voice recognition software. Occasional wrong-word or sound-a-like substitutions may have occurred due to the inherent limitations of voice recognition software

## 2023-04-10 NOTE — Patient Instructions (Addendum)
Please return in may 2025 for your annual complete physical; please come fasting.   If you have any questions or concerns, please don't hesitate to send me a message via MyChart or call the office at [phone]. Thank you for visiting with Katrina Williamson today! It's our pleasure caring for you.   VISIT SUMMARY:  During your visit, we discussed your recent fall and fainting episode, as well as your ongoing concerns about chest pain, neck pain, and your history of breast cancer. We reviewed your emergency room visit and the results of your tests, and we developed a plan to manage your symptoms and monitor your health moving forward.  YOUR PLAN:  -VASOVAGAL SYNCOPE: Vasovagal syncope is a common cause of fainting that occurs when your body overreacts to certain triggers, such as pain or stress, leading to a sudden drop in heart rate and blood pressure. We recommend monitoring your blood pressure, staying hydrated, eating well, and moving slowly to avoid sudden drops in blood pressure. We may also check your blood count to ensure stability.  -CHEST WALL CONTUSION: A chest wall contusion is a bruise on the chest that can cause significant pain due to the nerve bundles under each rib. We suggest using a heating pad for pain relief and taking extra strength Tylenol as needed. Please watch for any signs of internal bleeding, such as blood in your urine or black stools.  -CERVICAL AND LUMBAR OSTEOARTHRITIS: Osteoarthritis is a condition where the protective cartilage that cushions the ends of your bones wears down over time, causing pain and stiffness. For your neck and back pain, we recommend taking extra strength Tylenol in the morning and at night, using Advil for severe pain, and doing range of motion exercises. Heating pads can also help. Maintaining good posture and doing core exercises are important, and physical therapy may be considered if your symptoms persist. We may order cervical x-rays if further evaluation  is needed.  -GENERAL HEALTH MAINTENANCE: You are up to date with your immunizations. We plan to administer your flu shot and COVID-19 booster in December.  INSTRUCTIONS:  Please follow up with your oncologist to discuss the recent fall and any potential impact on your breast cancer markers at your next appointment.

## 2023-04-30 ENCOUNTER — Other Ambulatory Visit: Payer: Self-pay | Admitting: Family Medicine

## 2023-04-30 ENCOUNTER — Other Ambulatory Visit: Payer: Medicare Other

## 2023-04-30 DIAGNOSIS — Z13820 Encounter for screening for osteoporosis: Secondary | ICD-10-CM

## 2023-05-23 ENCOUNTER — Other Ambulatory Visit: Payer: Self-pay | Admitting: Family Medicine

## 2023-06-15 DIAGNOSIS — H01001 Unspecified blepharitis right upper eyelid: Secondary | ICD-10-CM | POA: Diagnosis not present

## 2023-06-15 DIAGNOSIS — H01005 Unspecified blepharitis left lower eyelid: Secondary | ICD-10-CM | POA: Diagnosis not present

## 2023-06-15 DIAGNOSIS — H01004 Unspecified blepharitis left upper eyelid: Secondary | ICD-10-CM | POA: Diagnosis not present

## 2023-06-15 DIAGNOSIS — H01002 Unspecified blepharitis right lower eyelid: Secondary | ICD-10-CM | POA: Diagnosis not present

## 2023-06-15 DIAGNOSIS — H2513 Age-related nuclear cataract, bilateral: Secondary | ICD-10-CM | POA: Diagnosis not present

## 2023-06-15 DIAGNOSIS — H43813 Vitreous degeneration, bilateral: Secondary | ICD-10-CM | POA: Diagnosis not present

## 2023-06-15 DIAGNOSIS — H18593 Other hereditary corneal dystrophies, bilateral: Secondary | ICD-10-CM | POA: Diagnosis not present

## 2023-06-15 DIAGNOSIS — H04123 Dry eye syndrome of bilateral lacrimal glands: Secondary | ICD-10-CM | POA: Diagnosis not present

## 2023-06-18 DIAGNOSIS — M9901 Segmental and somatic dysfunction of cervical region: Secondary | ICD-10-CM | POA: Diagnosis not present

## 2023-06-18 DIAGNOSIS — M9902 Segmental and somatic dysfunction of thoracic region: Secondary | ICD-10-CM | POA: Diagnosis not present

## 2023-06-18 DIAGNOSIS — M9907 Segmental and somatic dysfunction of upper extremity: Secondary | ICD-10-CM | POA: Diagnosis not present

## 2023-06-18 DIAGNOSIS — M25511 Pain in right shoulder: Secondary | ICD-10-CM | POA: Diagnosis not present

## 2023-06-19 DIAGNOSIS — L814 Other melanin hyperpigmentation: Secondary | ICD-10-CM | POA: Diagnosis not present

## 2023-06-19 DIAGNOSIS — D2371 Other benign neoplasm of skin of right lower limb, including hip: Secondary | ICD-10-CM | POA: Diagnosis not present

## 2023-06-19 DIAGNOSIS — L820 Inflamed seborrheic keratosis: Secondary | ICD-10-CM | POA: Diagnosis not present

## 2023-06-19 DIAGNOSIS — Z86018 Personal history of other benign neoplasm: Secondary | ICD-10-CM | POA: Diagnosis not present

## 2023-06-19 DIAGNOSIS — L821 Other seborrheic keratosis: Secondary | ICD-10-CM | POA: Diagnosis not present

## 2023-06-19 DIAGNOSIS — L578 Other skin changes due to chronic exposure to nonionizing radiation: Secondary | ICD-10-CM | POA: Diagnosis not present

## 2023-06-19 DIAGNOSIS — Z411 Encounter for cosmetic surgery: Secondary | ICD-10-CM | POA: Diagnosis not present

## 2023-06-19 DIAGNOSIS — D225 Melanocytic nevi of trunk: Secondary | ICD-10-CM | POA: Diagnosis not present

## 2023-06-19 DIAGNOSIS — D2230 Melanocytic nevi of unspecified part of face: Secondary | ICD-10-CM | POA: Diagnosis not present

## 2023-06-22 DIAGNOSIS — M9902 Segmental and somatic dysfunction of thoracic region: Secondary | ICD-10-CM | POA: Diagnosis not present

## 2023-06-22 DIAGNOSIS — M9907 Segmental and somatic dysfunction of upper extremity: Secondary | ICD-10-CM | POA: Diagnosis not present

## 2023-06-22 DIAGNOSIS — M9901 Segmental and somatic dysfunction of cervical region: Secondary | ICD-10-CM | POA: Diagnosis not present

## 2023-06-22 DIAGNOSIS — M25511 Pain in right shoulder: Secondary | ICD-10-CM | POA: Diagnosis not present

## 2023-06-25 DIAGNOSIS — M9902 Segmental and somatic dysfunction of thoracic region: Secondary | ICD-10-CM | POA: Diagnosis not present

## 2023-06-25 DIAGNOSIS — M9907 Segmental and somatic dysfunction of upper extremity: Secondary | ICD-10-CM | POA: Diagnosis not present

## 2023-06-25 DIAGNOSIS — M9901 Segmental and somatic dysfunction of cervical region: Secondary | ICD-10-CM | POA: Diagnosis not present

## 2023-06-25 DIAGNOSIS — M25511 Pain in right shoulder: Secondary | ICD-10-CM | POA: Diagnosis not present

## 2023-06-27 NOTE — Progress Notes (Signed)
[facility]  [address] 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 This was treated with lumpectomy and adjuvant radiation. She was placed on hormonal therapy with Tamoxifen in November 2022, and will continue this for a total of 5 years.  Osteoporosis, for which she started alendronate. She is past due for repeat bone density as of October 2024, which I will schedule now.  Plan:  She continues Tamoxifen 20mg  daily without difficulty. She informed me that she had a severe fall in October, 2024 on gravel and landed on her left breast. She informed me she fainted a couple minutes after this fall and she was rushed to the ER. Testing done that day were all negative and she notes being severely sore around her left lower ribs. This pain has now resolved and she notes having no bruising on her left breast. She is past due for a bone density scan and I will schedule that for her now. She will be due for her annual screening mammogram in June. Her PCP does routine labs. I will see her back in 6 months for reevaluation after her mammogram. She understands and agrees with this plan of care. I have answered her questions and she knows to call with any concerns.  I provided 12 minutes of face-to-face time during this this encounter and > 50% was spent counseling as documented under my assessment and plan.   Katrina Beckwith, MD [facility] AT [facility] [address] [place] [address] Dept: [phone] Dept Fax: [phone]   No orders of the defined types were placed in this encounter.  CHIEF COMPLAINT:  CC: Stage IA hormone receptor positive left breast cancer  Current Treatment:  Tamoxifen  HISTORY OF PRESENT  ILLNESS:  Katrina Williamson is a 68 y.o. female who presents as a transfer of care from Dr. Darrall Williamson office for the continued management and treatment of stage IA (T1b N0 M0) hormone receptor positive invasive lobular carcinoma of the left breast, diagnosed in June 2022. This was found on screening mammogram from May 2022 which showed possible abnormality in the left breast. Diagnostic imaging from June confirmed a 0.9 mm mass in the left breast at 3:30 o'clock. No adenopathy was identified. Biopsy was pursued on June 14th and pathology revealed invasive mammary carcinoma, grade 2, and mammary carcinoma in situ. Estrogen receptor was positive at 95% and progesterone receptor was positive at 90%. HER2 was negative 1+. Ki67 was 1%. Bilateral breast MRI from July revealed a biopsy proven malignancy measuring 1.1 cm within the left breast. Right breast was negative. She underwent lumpectomy with Dr. Donell Williamson on July 27th and final pathology from this procedure confirmed a grade 2 invasive lobular carcinoma, 1.4 cm, lobular neoplasia. All margins were negative. Two sentinel lymph nodes were negative for carcinoma (0/2). She did have some mild complications after her procedure with opening of the incisions, which required packing. Oncotype revealed a recurrence score of 22, low risk, and predicts a risk of recurrence outside the breast over the next 9 years of 8%, if the patient's only systemic therapy is an antiestrogen for 5 years.  It also predicted  no benefit from chemotherapy. She received adjuvant radiation to the left breast, and was started on adjuvant endocrine therapy with  tamoxifen in November 2022, with plans of 5 years of therapy.    I have reviewed her chart and materials related to her cancer extensively and collaborated history with the patient. Summary of oncologic history is as follows: Oncology History  Malignant neoplasm of lower-outer quadrant of left breast of female, estrogen receptor positive  (HCC)  11/29/2020 Initial Diagnosis   Malignant neoplasm of lower-outer quadrant of left breast of female, estrogen receptor positive (HCC)   11/30/2020 Cancer Staging   Staging form: Breast, AJCC 8th Edition - Clinical: Stage IA (cT1b, cN0, cM0, G2, ER+, PR+, HER2-) - Signed by Katrina Dell, MD on 11/30/2020 Histologic grading system: 3 grade system   12/29/2020 Surgery   Left breast lumpectomy with sentinel lymph node. Two lymph nodes biopsied, both negative for carcinoma.    Radiation Therapy   03/08/2021 through 04/04/2021 Site Technique Total Dose (Gy) Dose per Fx (Gy) Completed Fx Beam Energies  Breast, Left: Breast_Lt 3D 42.56/42.56 2.66 16/16 6X  Breast, Left: Breast_Lt_Bst 3D 8/8 2 4/4 6X     Surgery       INTERVAL HISTORY:  Katrina Williamson is here today for repeat clinical assessment of stage stage IA (T1b N0 M0) hormone receptor positive left breast cancer. She was diagnosed in June, 2022. Patient states that she feels well and has no complaints of pain. She continues Tamoxifen 20mg  daily without difficulty. She informed me that she had a severe fall in October, 2024 on gravel and landed on her left breast. She informed me she fainted a couple minutes after this fall and she was rushed to the ER. Testing done that day were all negative and she notes being severely sore around her left lower ribs. This pain has now resolved and she notes having no bruising on her left breast. She is past due for a bone density scan and I will schedule that for her now. She will be due for her annual screening mammogram in June. Her PCP does routine labs. I will see her back in 6 months for reevaluation after her mammogram.    She denies signs of infection such as sore throat, sinus drainage, cough, or urinary symptoms.  She denies fevers or recurrent chills. She denies pain. She denies nausea, vomiting, chest pain, dyspnea or cough. Her appetite is good and her weight has increased 9 pounds over last 2  months .    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  See old records   Osteoporosis    Postmenopausal HRT (hormone replacement therapy) 03/14/2018   Menopause at age 76;    Past Surgical History:  Procedure Laterality Date   BREAST LUMPECTOMY WITH RADIOACTIVE SEED AND SENTINEL LYMPH NODE BIOPSY Left 12/29/2020   Procedure: LEFT BREAST LUMPECTOMY WITH RADIOACTIVE SEED;  Surgeon: Almond Lint, MD;  Location: [facility] OR;  Service: General;  Laterality: Left;   SENTINEL NODE BIOPSY Left 12/29/2020   Procedure: LEFT AXILLARY SENTINEL NODE BIOPSY;  Surgeon: Almond Lint, MD;  Location: [facility] OR;  Service: General;  Laterality: Left;   WISDOM TOOTH EXTRACTION     Family History  Problem Relation Age of Onset  Arthritis Mother    Hearing loss Mother    Osteoporosis Mother        significant   Stroke Father    Alcohol abuse Sister    Arthritis Sister    Cancer Sister        uterine; 54s   Social History:  reports that she has never smoked. She has never used smokeless tobacco. She reports current alcohol use of about 2.0 standard drinks of alcohol per week. She reports that she does not currently use drugs after having used the following drugs: Marijuana. She is divorced and lives at home. She is nulliparous. 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  No Known Allergies  Current Medications: Current Outpatient Medications  Medication Sig Dispense Refill   alendronate (FOSAMAX) 70 MG tablet Take 1 tablet (70 mg total) by mouth every 7 (seven) days. Take with a full glass of water on an empty stomach. 12 tablet 3   fluticasone (FLONASE) 50 MCG/ACT  nasal spray Place 2 sprays into both nostrils daily.     sertraline (ZOLOFT) 50 MG tablet Take 1 tablet (50 mg total) by mouth at bedtime. 90 tablet 3   tamoxifen (NOLVADEX) 20 MG tablet Take 1 tablet (20 mg total) by mouth daily. 90 tablet 3   No current facility-administered medications for this visit.    REVIEW OF SYSTEMS:  Review of Systems  Constitutional: Negative.  Negative for appetite change, chills, diaphoresis, fatigue, fever and unexpected weight change.  HENT:  Negative.  Negative for hearing loss, lump/mass, mouth sores, nosebleeds, sore throat, tinnitus, trouble swallowing and voice change.   Eyes: Negative.  Negative for eye problems and icterus.  Respiratory: Negative.  Negative for chest tightness, cough, hemoptysis, shortness of breath and wheezing.   Cardiovascular: Negative.  Negative for chest pain (Occasional pain of the lumpectomy and axillary sites), leg swelling and palpitations.  Gastrointestinal: Negative.  Negative for abdominal distention, abdominal pain, blood in stool, constipation, diarrhea, nausea, rectal pain and vomiting.  Endocrine: Negative.   Genitourinary:  Negative for bladder incontinence, difficulty urinating, dyspareunia, dysuria, frequency, hematuria, menstrual problem, nocturia, pelvic pain, vaginal bleeding and vaginal discharge.   Musculoskeletal: Negative.  Negative for arthralgias, back pain, flank pain, gait problem, myalgias, neck pain (aching) and neck stiffness.  Skin: Negative.  Negative for itching, rash and wound.  Neurological:  Negative for dizziness, extremity weakness, gait problem, headaches, light-headedness, numbness, seizures and speech difficulty.  Hematological: Negative.  Negative for adenopathy. Does not bruise/bleed easily.  Psychiatric/Behavioral: Negative.  Negative for confusion, decreased concentration, depression, sleep disturbance and suicidal ideas. The patient is not nervous/anxious.    VITALS:  Blood pressure (!)  117/59, pulse 70, temperature 98 F (36.7 C), temperature source Oral, resp. rate 16, height 5\' 4"  (1.626 m), weight 148 lb (67.1 kg), SpO2 96 %.  Wt Readings from Last 3 Encounters:  01/17/22 148 lb (67.1 kg)  10/17/21 146 lb 3.2 oz (66.3 kg)  10/03/21 147 lb (66.7 kg)    Body mass index is 25.4 kg/m.  Performance status (ECOG): 0 - Asymptomatic  PHYSICAL EXAM:  Physical Exam Vitals and nursing note reviewed.  Constitutional:      General: She is not in acute distress.    Appearance: Normal appearance. She is normal weight. She is not ill-appearing, toxic-appearing or diaphoretic.  HENT:     Head: Normocephalic and atraumatic.     Right Ear: Tympanic membrane, ear canal and  external ear normal. There is no impacted cerumen.     Left Ear: Tympanic membrane, ear canal and external ear normal. There is no impacted cerumen.     Nose: Nose normal. No congestion or rhinorrhea.     Mouth/Throat:     Mouth: Mucous membranes are moist.     Pharynx: Oropharynx is clear. No oropharyngeal exudate or posterior oropharyngeal erythema.  Eyes:     General: No scleral icterus.       Right eye: No discharge.        Left eye: No discharge.     Extraocular Movements: Extraocular movements intact.     Conjunctiva/sclera: Conjunctivae normal.     Pupils: Pupils are equal, round, and reactive to light.  Neck:     Vascular: No carotid bruit.  Cardiovascular:     Rate and Rhythm: Normal rate and regular rhythm.     Pulses: Normal pulses.     Heart sounds: Normal heart sounds. No murmur heard.    No friction rub. No gallop.  Pulmonary:     Effort: Pulmonary effort is normal. No respiratory distress.     Breath sounds: Normal breath sounds. No stridor. No wheezing, rhonchi or rales.  Chest:     Chest wall: No tenderness.     Comments: Keratotic lesion in the mid upper sternum which has had recent liquid nitrogen for freezing and rim discoloration.   Well healed scar in the lateral left breast at  approximately 3 o'clock. Well healed left axillary scar.  No masses in either breasts Abdominal:     General: Bowel sounds are normal. There is no distension.     Palpations: Abdomen is soft. There is no hepatomegaly, splenomegaly or mass.     Tenderness: There is no abdominal tenderness. There is no right CVA tenderness, left CVA tenderness, guarding or rebound.     Hernia: No hernia is present.  Musculoskeletal:        General: No swelling, tenderness, deformity or signs of injury. Normal range of motion.     Cervical back: Normal range of motion and neck supple. No rigidity or tenderness.     Right lower leg: No edema.     Left lower leg: No edema.  Lymphadenopathy:     Cervical: No cervical adenopathy.     Right cervical: No superficial, deep or posterior cervical adenopathy.    Left cervical: No superficial, deep or posterior cervical adenopathy.     Upper Body:     Right upper body: No supraclavicular, axillary or pectoral adenopathy.     Left upper body: No supraclavicular, axillary or pectoral adenopathy.  Skin:    General: Skin is warm and dry.     Coloration: Skin is not jaundiced or pale.     Findings: No bruising, erythema, lesion or rash.  Neurological:     General: No focal deficit present.     Mental Status: She is alert and oriented to person, place, and time. Mental status is at baseline.     Cranial Nerves: No cranial nerve deficit.     Sensory: No sensory deficit.     Motor: No weakness.     Coordination: Coordination normal.     Gait: Gait normal.     Deep Tendon Reflexes: Reflexes normal.  Psychiatric:        Mood and Affect: Mood normal.        Behavior: Behavior normal.        Thought Content: Thought content normal.  Judgment: Judgment normal.    LABS:   Lab Results  Component Value Date   WBC 4.9 01/21/2023   HGB 12.1 01/21/2023   HCT 37.5 01/21/2023   MCV 91.9 01/21/2023   PLT 158 01/21/2023   Lab Results  Component Value Date    CREATININE 1.07 (H) 01/21/2023   BUN 12 01/21/2023   NA 139 01/21/2023   K 3.7 01/21/2023   CL 103 01/21/2023   CO2 24 01/21/2023      Component Value Date/Time   PROT 6.5 10/05/2022 1024   ALBUMIN 4.0 10/05/2022 1024   AST 17 10/05/2022 1024   AST 18 04/25/2022 1049   ALT 31 10/05/2022 1024   ALT 15 04/25/2022 1049   ALKPHOS 65 10/05/2022 1024   BILITOT 0.3 10/05/2022 1024   BILITOT 0.8 04/25/2022 1049    Lab Results  Component Value Date   TSH 4.05 10/05/2022    STUDIES:        I,Jasmine M Lassiter,acting as a scribe for Katrina Beckwith, MD.,have documented all relevant documentation on the behalf of Katrina Beckwith, MD,as directed by  Katrina Beckwith, MD while in the presence of Katrina Beckwith, MD.  I have reviewed this report as typed by the medical scribe, and it is complete and accurate.

## 2023-06-28 ENCOUNTER — Other Ambulatory Visit: Payer: Self-pay | Admitting: Oncology

## 2023-06-28 ENCOUNTER — Encounter: Payer: Self-pay | Admitting: Oncology

## 2023-06-28 ENCOUNTER — Inpatient Hospital Stay: Payer: Medicare Other | Attending: Oncology | Admitting: Oncology

## 2023-06-28 VITALS — BP 104/66 | HR 68 | Temp 98.0°F | Resp 18 | Ht 64.0 in | Wt 157.8 lb

## 2023-06-28 DIAGNOSIS — M810 Age-related osteoporosis without current pathological fracture: Secondary | ICD-10-CM | POA: Insufficient documentation

## 2023-06-28 DIAGNOSIS — Z923 Personal history of irradiation: Secondary | ICD-10-CM | POA: Insufficient documentation

## 2023-06-28 DIAGNOSIS — Z170 Estrogen receptor positive status [ER+]: Secondary | ICD-10-CM | POA: Insufficient documentation

## 2023-06-28 DIAGNOSIS — Z7983 Long term (current) use of bisphosphonates: Secondary | ICD-10-CM | POA: Diagnosis not present

## 2023-06-28 DIAGNOSIS — C50512 Malignant neoplasm of lower-outer quadrant of left female breast: Secondary | ICD-10-CM | POA: Insufficient documentation

## 2023-06-28 DIAGNOSIS — Z1231 Encounter for screening mammogram for malignant neoplasm of breast: Secondary | ICD-10-CM

## 2023-06-28 DIAGNOSIS — Z79810 Long term (current) use of selective estrogen receptor modulators (SERMs): Secondary | ICD-10-CM | POA: Insufficient documentation

## 2023-06-28 DIAGNOSIS — Z1239 Encounter for other screening for malignant neoplasm of breast: Secondary | ICD-10-CM

## 2023-06-29 ENCOUNTER — Encounter (HOSPITAL_BASED_OUTPATIENT_CLINIC_OR_DEPARTMENT_OTHER): Payer: Self-pay

## 2023-07-02 DIAGNOSIS — M9907 Segmental and somatic dysfunction of upper extremity: Secondary | ICD-10-CM | POA: Diagnosis not present

## 2023-07-02 DIAGNOSIS — M9901 Segmental and somatic dysfunction of cervical region: Secondary | ICD-10-CM | POA: Diagnosis not present

## 2023-07-02 DIAGNOSIS — M25511 Pain in right shoulder: Secondary | ICD-10-CM | POA: Diagnosis not present

## 2023-07-02 DIAGNOSIS — M9902 Segmental and somatic dysfunction of thoracic region: Secondary | ICD-10-CM | POA: Diagnosis not present

## 2023-07-04 DIAGNOSIS — M9901 Segmental and somatic dysfunction of cervical region: Secondary | ICD-10-CM | POA: Diagnosis not present

## 2023-07-04 DIAGNOSIS — M25511 Pain in right shoulder: Secondary | ICD-10-CM | POA: Diagnosis not present

## 2023-07-04 DIAGNOSIS — M9907 Segmental and somatic dysfunction of upper extremity: Secondary | ICD-10-CM | POA: Diagnosis not present

## 2023-07-04 DIAGNOSIS — M9902 Segmental and somatic dysfunction of thoracic region: Secondary | ICD-10-CM | POA: Diagnosis not present

## 2023-07-09 DIAGNOSIS — M25511 Pain in right shoulder: Secondary | ICD-10-CM | POA: Diagnosis not present

## 2023-07-09 DIAGNOSIS — M9902 Segmental and somatic dysfunction of thoracic region: Secondary | ICD-10-CM | POA: Diagnosis not present

## 2023-07-09 DIAGNOSIS — M9901 Segmental and somatic dysfunction of cervical region: Secondary | ICD-10-CM | POA: Diagnosis not present

## 2023-07-09 DIAGNOSIS — M9907 Segmental and somatic dysfunction of upper extremity: Secondary | ICD-10-CM | POA: Diagnosis not present

## 2023-07-10 DIAGNOSIS — Z23 Encounter for immunization: Secondary | ICD-10-CM | POA: Diagnosis not present

## 2023-07-11 DIAGNOSIS — M25511 Pain in right shoulder: Secondary | ICD-10-CM | POA: Diagnosis not present

## 2023-07-11 DIAGNOSIS — M9902 Segmental and somatic dysfunction of thoracic region: Secondary | ICD-10-CM | POA: Diagnosis not present

## 2023-07-11 DIAGNOSIS — M9901 Segmental and somatic dysfunction of cervical region: Secondary | ICD-10-CM | POA: Diagnosis not present

## 2023-07-11 DIAGNOSIS — M9907 Segmental and somatic dysfunction of upper extremity: Secondary | ICD-10-CM | POA: Diagnosis not present

## 2023-07-13 ENCOUNTER — Ambulatory Visit (INDEPENDENT_AMBULATORY_CARE_PROVIDER_SITE_OTHER)
Admission: RE | Admit: 2023-07-13 | Discharge: 2023-07-13 | Disposition: A | Discharge status: Home / Self Care | Payer: Medicare Other | Source: Ambulatory Visit | Attending: Oncology | Admitting: Oncology

## 2023-07-13 DIAGNOSIS — M9907 Segmental and somatic dysfunction of upper extremity: Secondary | ICD-10-CM | POA: Diagnosis not present

## 2023-07-13 DIAGNOSIS — M810 Age-related osteoporosis without current pathological fracture: Secondary | ICD-10-CM

## 2023-07-13 DIAGNOSIS — M25511 Pain in right shoulder: Secondary | ICD-10-CM | POA: Diagnosis not present

## 2023-07-13 DIAGNOSIS — M9902 Segmental and somatic dysfunction of thoracic region: Secondary | ICD-10-CM | POA: Diagnosis not present

## 2023-07-13 DIAGNOSIS — Z780 Asymptomatic menopausal state: Secondary | ICD-10-CM | POA: Diagnosis not present

## 2023-07-13 DIAGNOSIS — M9901 Segmental and somatic dysfunction of cervical region: Secondary | ICD-10-CM | POA: Diagnosis not present

## 2023-07-16 DIAGNOSIS — M9901 Segmental and somatic dysfunction of cervical region: Secondary | ICD-10-CM | POA: Diagnosis not present

## 2023-07-16 DIAGNOSIS — M9907 Segmental and somatic dysfunction of upper extremity: Secondary | ICD-10-CM | POA: Diagnosis not present

## 2023-07-16 DIAGNOSIS — M9902 Segmental and somatic dysfunction of thoracic region: Secondary | ICD-10-CM | POA: Diagnosis not present

## 2023-07-16 DIAGNOSIS — M25511 Pain in right shoulder: Secondary | ICD-10-CM | POA: Diagnosis not present

## 2023-10-08 ENCOUNTER — Encounter: Payer: Medicare Other | Admitting: Family Medicine

## 2023-10-22 ENCOUNTER — Ambulatory Visit (INDEPENDENT_AMBULATORY_CARE_PROVIDER_SITE_OTHER): Admitting: Family Medicine

## 2023-10-22 ENCOUNTER — Encounter: Payer: Self-pay | Admitting: Family Medicine

## 2023-10-22 ENCOUNTER — Other Ambulatory Visit (HOSPITAL_COMMUNITY)
Admission: RE | Admit: 2023-10-22 | Discharge: 2023-10-22 | Disposition: A | Discharge status: Home / Self Care | Source: Ambulatory Visit | Attending: Family Medicine | Admitting: Family Medicine

## 2023-10-22 VITALS — BP 121/66 | HR 68 | Temp 98.0°F | Ht 64.0 in | Wt 152.8 lb

## 2023-10-22 DIAGNOSIS — J301 Allergic rhinitis due to pollen: Secondary | ICD-10-CM | POA: Diagnosis not present

## 2023-10-22 DIAGNOSIS — Z170 Estrogen receptor positive status [ER+]: Secondary | ICD-10-CM

## 2023-10-22 DIAGNOSIS — R251 Tremor, unspecified: Secondary | ICD-10-CM | POA: Diagnosis not present

## 2023-10-22 DIAGNOSIS — C50512 Malignant neoplasm of lower-outer quadrant of left female breast: Secondary | ICD-10-CM

## 2023-10-22 DIAGNOSIS — N898 Other specified noninflammatory disorders of vagina: Secondary | ICD-10-CM | POA: Insufficient documentation

## 2023-10-22 DIAGNOSIS — M810 Age-related osteoporosis without current pathological fracture: Secondary | ICD-10-CM

## 2023-10-22 DIAGNOSIS — E782 Mixed hyperlipidemia: Secondary | ICD-10-CM

## 2023-10-22 DIAGNOSIS — M47812 Spondylosis without myelopathy or radiculopathy, cervical region: Secondary | ICD-10-CM

## 2023-10-22 DIAGNOSIS — R0683 Snoring: Secondary | ICD-10-CM | POA: Diagnosis not present

## 2023-10-22 DIAGNOSIS — F4322 Adjustment disorder with anxiety: Secondary | ICD-10-CM

## 2023-10-22 LAB — CBC WITH DIFFERENTIAL/PLATELET
Basophils Absolute: 0 10*3/uL (ref 0.0–0.1)
Basophils Relative: 0.7 % (ref 0.0–3.0)
Eosinophils Absolute: 0.3 10*3/uL (ref 0.0–0.7)
Eosinophils Relative: 4.8 % (ref 0.0–5.0)
HCT: 41.6 % (ref 36.0–46.0)
Hemoglobin: 13.8 g/dL (ref 12.0–15.0)
Lymphocytes Relative: 21.2 % (ref 12.0–46.0)
Lymphs Abs: 1.2 10*3/uL (ref 0.7–4.0)
MCHC: 33.1 g/dL (ref 30.0–36.0)
MCV: 90.5 fl (ref 78.0–100.0)
Monocytes Absolute: 0.5 10*3/uL (ref 0.1–1.0)
Monocytes Relative: 8.3 % (ref 3.0–12.0)
Neutro Abs: 3.6 10*3/uL (ref 1.4–7.7)
Neutrophils Relative %: 65 % (ref 43.0–77.0)
Platelets: 234 10*3/uL (ref 150.0–400.0)
RBC: 4.59 Mil/uL (ref 3.87–5.11)
RDW: 13.5 % (ref 11.5–15.5)
WBC: 5.6 10*3/uL (ref 4.0–10.5)

## 2023-10-22 LAB — COMPREHENSIVE METABOLIC PANEL WITH GFR
ALT: 18 U/L (ref 0–35)
AST: 19 U/L (ref 0–37)
Albumin: 4.4 g/dL (ref 3.5–5.2)
Alkaline Phosphatase: 62 U/L (ref 39–117)
BUN: 15 mg/dL (ref 6–23)
CO2: 27 meq/L (ref 19–32)
Calcium: 8.9 mg/dL (ref 8.4–10.5)
Chloride: 106 meq/L (ref 96–112)
Creatinine, Ser: 0.96 mg/dL (ref 0.40–1.20)
GFR: 60.97 mL/min (ref >60.00)
Glucose, Bld: 100 mg/dL — ABNORMAL HIGH (ref 70–99)
Potassium: 4 meq/L (ref 3.5–5.1)
Sodium: 140 meq/L (ref 135–145)
Total Bilirubin: 0.6 mg/dL (ref 0.2–1.2)
Total Protein: 6.8 g/dL (ref 6.0–8.3)

## 2023-10-22 LAB — LIPID PANEL
Cholesterol: 241 mg/dL — ABNORMAL HIGH (ref 0–200)
HDL: 50.6 mg/dL (ref >39.00)
LDL Cholesterol: 153 mg/dL — ABNORMAL HIGH (ref 0–99)
NonHDL: 190.14
Total CHOL/HDL Ratio: 5
Triglycerides: 188 mg/dL — ABNORMAL HIGH (ref 0.0–149.0)
VLDL: 37.6 mg/dL (ref 0.0–40.0)

## 2023-10-22 MED ORDER — SERTRALINE HCL 50 MG PO TABS: 50.0000 mg | ORAL_TABLET | Freq: Every day | ORAL | 3 refills | Status: DC

## 2023-10-22 NOTE — Patient Instructions (Addendum)
 Please return in 12 months for your annual complete physical; please come fasting. For follow up on chronic medical conditions   I will release your lab results to you on your MyChart account with further instructions. You may see the results before I do, but when I review them I will send you a message with my report or have my assistant call you if things need to be discussed. Please reply to my message with any questions. Thank you!   If you have any questions or concerns, please don't hesitate to send me a message via MyChart or call the office at [phone]. Thank you for visiting with us  today! It's our pleasure caring for you.    VISIT SUMMARY: Today, you were seen for concerns about a possible vaginal infection, sleep apnea, and tremors. We discussed your symptoms and current medications, and I provided recommendations for each issue. We also reviewed your bone health and strategies to improve medication adherence.  YOUR PLAN: -BACTERIAL VAGINOSIS: Bacterial vaginosis is an infection caused by an imbalance of bacteria in the vagina. We will perform a vaginal swab to confirm the diagnosis and prescribe oral antibiotics if the infection is present.  -SUSPECTED SLEEP APNEA: Sleep apnea is a condition where breathing repeatedly stops and starts during sleep. I will refer you to a pulmonologist for further evaluation and a possible sleep study to confirm the diagnosis.  -ESSENTIAL TREMOR: Essential tremor is a nervous system disorder that causes involuntary and rhythmic shaking. We will monitor your tremor for any progression and consider referring you to a neurologist if it worsens.  -OSTEOPOROSIS: Osteoporosis is a condition where bones become weak and brittle. To help you remember to take your fosamax .  INSTRUCTIONS: Please follow up with the pulmonologist for your sleep apnea evaluation and complete the vaginal swab test as discussed. Continue monitoring your tremor and let us  know if it  worsens. Remember to take your fosamax  as advised to maintain your bone health.   Tremor A tremor is trembling or shaking that you cannot control. Most tremors affect the hands or arms. Tremors can also affect the head, vocal cords, face, and other parts of the body. There are many types of tremors. Common types include: Essential tremor. These usually occur in people older than 40. This type of tremor may run in families and can happen in otherwise healthy people. Resting tremor. These occur when the muscles are at rest, such as when your hands are resting in your lap. People with Parkinson's disease often have resting tremors. Postural tremor. These occur when you try to hold a pose, such as keeping your hands outstretched. Kinetic tremor. These occur during purposeful movement, such as trying to touch a finger to your nose. Task-specific tremor. These may occur when you do certain tasks such as writing, speaking, or standing. Psychogenic tremor. These are greatly reduced or go away when you are distracted. These tremors happen due to underlying stress or psychiatric disease. They can happen in people of all ages. Some types of tremors have no known cause. Tremors can also be a symptom of nervous system problems (neurological disorders) that may occur with aging. Some tremors go away with treatment, while others do not. Follow these instructions at home: Lifestyle     If you drink alcohol: Limit how much you have to: 0-1 drink a day for women who are not pregnant. 0-2 drinks a day for men. Know how much alcohol is in a drink. In the [place] one drink  equals one 12 oz bottle of beer (355 mL), one 5 oz glass of wine (148 mL), or one 1 oz glass of hard liquor (44 mL). Do not use any products that contain nicotine or tobacco. These products include cigarettes, chewing tobacco, and vaping devices, such as e-cigarettes. If you need help quitting, ask your health care provider. Avoid extreme heat  and extreme cold. Limit your caffeine intake, as told by your health care provider. Try to get 8 hours of sleep each night. Find ways to manage your stress, such as meditation or yoga. General instructions Take over-the-counter and prescription medicines only as told by your health care provider. Keep all follow-up visits. This is important. Contact a health care provider if: You develop a tremor after starting a new medicine. You have a tremor along with other symptoms such as: Numbness. Tingling. Pain. Weakness. Your tremor gets worse. Your tremor interferes with your day-to-day life. Summary A tremor is trembling or shaking that you cannot control. Most tremors affect the hands or arms. Some types of tremors have no known cause. Others may be a symptom of nervous system problems (neurological disorders). Make sure you discuss any tremors you have with your health care provider. This information is not intended to replace advice given to you by your health care provider. Make sure you discuss any questions you have with your health care provider. Document Revised: 03/11/2021 Document Reviewed: 03/11/2021 Elsevier Patient Education  2024 [employer] Inc.                   Contains text generated by [employer].                                 Contains text generated by [employer].

## 2023-10-22 NOTE — Progress Notes (Signed)
 Subjective  CC:  Chief Complaint  Patient presents with   Annual Exam    Pt here for Annual Exam and is currently fasting     HPI: Katrina Williamson is a 68 y.o. female who presents to the office today to address the problems listed above in the chief complaint. Discussed the use of AI scribe software for clinical note transcription with the patient, who gave verbal consent to proceed.  History of Present Illness Katrina Williamson is a 68 year old female who presents for medicare physical, f/u chronic problems and with concerns about a possible vaginal infection, sleep apnea, and tremors.  She has recently noticed a vaginal discharge, described as mostly whitish but occasionally yellowish, with no associated itching or significant odor, although a slight odor was noted the other day. There is no irritation. She is currently on tamoxifen  and has been for nearly three years. No concerns about STDs.  Her husband is concerned about her snoring and possible sleep apnea. She has always snored, but her husband notes that it sounds like she is struggling for breath. She wakes up feeling refreshed but becomes exhausted by the afternoon. She sleeps approximately eight hours at night and takes a two-hour nap during the day. Her husband is worried about her breathing during sleep, which keeps him awake.  She is concerned about a tremor in her left hand, noticeable when sitting at rest or when using her left hand, such as when picking up a glass. The tremor is described as random and not present when her hand is resting on her lap. Her husband has also noticed a wobble when she walks, although she states she has always walked that way. No changes in gait, slowing, shuffling, or problems turning. There is no family history of tremors or Parkinson's disease. She also reports stiffness but no cognitive issues.  She has been taking ballet classes and voice lessons, which she enjoys and finds beneficial. She notes  some balance issues but feels they are improving. 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  She mentions difficulty remembering to take her fosamax , which is intended for her bone health. She has set alarms but still struggles to remember to take it regularly. She is on sertraline  and receives it through mail order. Her bone density was last checked in October and was stable. She is exercising.   Mood is well controlled on sertraline .    Assessment  1. Osteoporosis without current pathological fracture, unspecified osteoporosis type   2. Malignant neoplasm of lower-outer quadrant of left breast of female, estrogen receptor positive (HCC)   3. Mixed hyperlipidemia   4. Seasonal allergic rhinitis due to pollen   5. Spondylosis of cervical region without myelopathy or radiculopathy   6. Adjustment disorder with anxiety   7. Snoring   8. Vaginal discharge   9. Tremor      Plan  Assessment and Plan Assessment & Plan Possible Bacterial vaginosis Intermittent vaginal discharge with possible bacterial vaginosis. No STD concern. Tamoxifen  could also be the cause - Perform vaginal swab to confirm diagnosis. - Prescribe oral antibiotics if bacterial vaginosis is confirmed.  Possible sleep apnea with snoring Snoring, nocturnal awakenings, and daytime sleepiness suggest possible sleep apnea. - Refer to pulmonologist for evaluation and possible sleep study.  Essential tremor vs functional tremor vs other Intermittent left hand tremor, positional, likely benign, not consistent with Parkinson's disease at this time - Monitor tremor for progression. - Consider  referral to neurologist if tremor worsens.  Osteoporosis Osteoporosis with slight improvement in bone density. Ballet and voice lessons may aid bone health. - Advise taking fosamax  weekly - Suggest keeping medication by toothbrush to aid in routine adherence.  Anxiety: continue sertraline . Well controlled.     Follow up: 12 mo for cpe Orders Placed This Encounter  Procedures   VITAMIN D  25 Hydroxy (Vit-D Deficiency, Fractures)   CBC with Differential/Platelet   Comprehensive metabolic panel with GFR   Lipid panel   TSH   Ambulatory referral to Pulmonology   Meds ordered this encounter  Medications   sertraline  (ZOLOFT ) 50 MG tablet    Sig: Take 1 tablet (50 mg total) by mouth at bedtime.    Dispense:  90 tablet    Refill:  3     I reviewed the patients updated PMH, FH, and SocHx.  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   tamoxifen  (NOLVADEX ) 20 MG tablet Take 1 tablet (20 mg total) by mouth daily.   [DISCONTINUED] sertraline  (ZOLOFT ) 50 MG tablet Take 1 tablet (50 mg total) by mouth at bedtime.   Allergies: Patient has no known allergies. Family History: Patient family history includes Alcohol abuse in her sister; Arthritis in her mother and sister; Cancer in her sister; Hearing loss in her mother; Osteoporosis in her mother; Stroke in her father. Social History:  Patient  reports that she has never smoked. She has never used smokeless tobacco. She reports current alcohol use of about 2.0 standard drinks of alcohol per  week. She reports that she does not currently use drugs after having used the following drugs: Marijuana.  Review of Systems: Constitutional: Negative for fever malaise or anorexia Cardiovascular: negative for chest pain Respiratory: negative for SOB or persistent cough Gastrointestinal: negative for abdominal pain  Objective  Vitals: BP 121/66   Pulse 68   Temp 98 F (36.7 C)   Ht 5\' 4"  (1.626 m)   Wt 152 lb 12.8 oz (69.3 kg)   SpO2 98%   BMI 26.23 kg/m  General: no acute distress , A&Ox3 HEENT: PEERL, conjunctiva normal, neck is supple Cardiovascular:  RRR without murmur or gallop.  Respiratory:  Good breath sounds bilaterally, CTAB with normal respiratory effort Skin:  Warm, no rashes Normal gait Neuro: nonfocal. Positional tremor on left only when holds are in certain position.  No ataxia, nl RAM. Nl gait Commons side effects, risks, benefits, and alternatives for medications and treatment plan prescribed today were discussed, and the patient expressed understanding of the given instructions. Patient is instructed to call or message via MyChart if he/she has any questions or concerns regarding our treatment plan. No barriers to understanding were identified. We discussed Red Flag symptoms and signs in detail. Patient expressed understanding  regarding what to do in case of urgent or emergency type symptoms.  Medication list was reconciled, printed and provided to the patient in AVS. Patient instructions and summary information was reviewed with the patient as documented in the AVS. This note was prepared with assistance of Dragon voice recognition software. Occasional wrong-word or sound-a-like substitutions may have occurred due to the inherent limitations of voice recognition software

## 2023-10-23 ENCOUNTER — Ambulatory Visit: Payer: Self-pay | Admitting: Family Medicine

## 2023-10-23 LAB — CERVICOVAGINAL ANCILLARY ONLY
Bacterial Vaginitis (gardnerella): POSITIVE — AB
Candida Glabrata: POSITIVE — AB
Candida Vaginitis: NEGATIVE
Comment: NEGATIVE
Comment: NEGATIVE
Comment: NEGATIVE
Comment: NEGATIVE
Trichomonas: NEGATIVE

## 2023-10-23 LAB — VITAMIN D 25 HYDROXY (VIT D DEFICIENCY, FRACTURES): VITD: 30.08 ng/mL (ref 30.00–100.00)

## 2023-10-23 LAB — TSH: TSH: 2.61 u[IU]/mL (ref 0.35–5.50)

## 2023-10-23 MED ORDER — METRONIDAZOLE 500 MG PO TABS: 500.0000 mg | ORAL_TABLET | Freq: Two times a day (BID) | ORAL | 0 refills | Status: DC

## 2023-10-23 MED ORDER — ITRACONAZOLE 100 MG PO CAPS: 100.0000 mg | ORAL_CAPSULE | Freq: Every day | ORAL | 0 refills | Status: DC

## 2023-10-23 NOTE — Progress Notes (Signed)
 Labs reviewed.  The 10-year ASCVD risk score (Arnett DK, et al., 2019) is: 7.6%   Values used to calculate the score:     Age: 68 years     Sex: Female     Is Non-Hispanic African American: No     Diabetic: No     Tobacco smoker: No     Systolic Blood Pressure: 121 mmHg     Is BP treated: No     HDL Cholesterol: 50.6 mg/dL     Total Cholesterol: 241 mg/dL

## 2023-10-24 ENCOUNTER — Telehealth: Payer: Self-pay

## 2023-10-24 ENCOUNTER — Other Ambulatory Visit (HOSPITAL_COMMUNITY): Payer: Self-pay

## 2023-10-24 NOTE — Telephone Encounter (Signed)
 Pharmacy Patient Advocate Encounter   Received notification from CoverMyMeds that prior authorization for ITRACONAZOLE 100MG  CAPS  is required/requested.   Insurance verification completed.   The patient is insured through [facility] .   Per test claim: PA required; PA submitted to above mentioned insurance via CoverMyMeds Key/confirmation #/EOC [id number] Status is pending

## 2023-10-24 NOTE — Telephone Encounter (Signed)
 Pharmacy Patient Advocate Encounter  Received notification from [facility] that Prior Authorization for Itraconazole 100MG  capsules has been DENIED.  Full denial letter will be uploaded to the media tab. See denial reason below.   PA #/Case ID/Reference #: [id number]

## 2023-10-26 MED ORDER — FLUCONAZOLE 150 MG PO TABS: ORAL_TABLET | ORAL | 0 refills | Status: DC

## 2023-10-26 NOTE — Addendum Note (Signed)
 Addended by: Karma Oz on: 10/26/2023 09:35 AM   Modules accepted: Orders

## 2023-10-26 NOTE — Telephone Encounter (Signed)
 Sporonox denied.   Will treat with diflucan and retest if needed.  Flagyl for bv.

## 2023-11-19 ENCOUNTER — Ambulatory Visit (INDEPENDENT_AMBULATORY_CARE_PROVIDER_SITE_OTHER): Payer: Medicare Other

## 2023-11-19 VITALS — Ht 64.0 in | Wt 152.0 lb

## 2023-11-19 DIAGNOSIS — Z0000 Encounter for general adult medical examination without abnormal findings: Secondary | ICD-10-CM

## 2023-11-19 NOTE — Patient Instructions (Signed)
 Katrina Williamson , Thank you for taking time out of your busy schedule to complete your Annual Wellness Visit with me. I enjoyed our conversation and look forward to speaking with you again next year. I, as well as your care team,  appreciate your ongoing commitment to your health goals. Please review the following plan we discussed and let me know if I can assist you in the future. Your Game plan/ To Do List    Follow up Visits: Next Medicare AWV with our clinical staff: In 1 year    Have you seen your provider in the last 6 months (3 months if uncontrolled diabetes)? Yes Next Office Visit with your provider: 10/23/24 @ 11:00  Clinician Recommendations:  Aim for 30 minutes of exercise or brisk walking, 6-8 glasses of water, and 5 servings of fruits and vegetables each day.       This is a list of the screening recommended for you and due dates:  Health Maintenance  Topic Date Due   COVID-19 Vaccine (6 - 2024-25 season) 02/04/2023   Mammogram  11/16/2023   Flu Shot  01/04/2024   Medicare Annual Wellness Visit  11/18/2024   Colon Cancer Screening  03/14/2025   DEXA scan (bone density measurement)  07/12/2025   DTaP/Tdap/Td vaccine (2 - Td or Tdap) 03/14/2028   Pneumococcal Vaccine for age over 36  Completed   Hepatitis C Screening  Completed   Zoster (Shingles) Vaccine  Completed   HPV Vaccine  Aged Out   Meningitis B Vaccine  Aged Out    Advanced directives: (ACP Link)Information on Advanced Care Planning can be found at Agricultural consultant Advance Health Care Directives Advance Health Care Directives. [email or web address]   Advance Care Planning is important because it:  [x]  Makes sure you receive the medical care that is consistent with your values, goals, and preferences  [x]  It provides guidance to your family and loved ones and reduces their decisional burden about whether or not they are making the right decisions based on your wishes.  Follow the link provided in your after  visit summary or read over the paperwork we have mailed to you to help you started getting your Advance Directives in place. If you need assistance in completing these, please reach out to us  so that we can help you!  See attachments for Preventive Care and Fall Prevention Tips.

## 2023-11-19 NOTE — Progress Notes (Signed)
 Subjective:   Katrina Williamson is a 68 y.o. who presents for a Medicare Wellness preventive visit.  As a reminder, Annual Wellness Visits don't include a physical exam, and some assessments may be limited, especially if this visit is performed virtually. We may recommend an in-person follow-up visit with your provider if needed.  Visit Complete: Virtual I connected with  Katrina Williamson on 11/19/23 by a audio enabled telemedicine application and verified that I am speaking with the correct person using two identifiers.  Patient Location: Home  Provider Location: Home Office  I discussed the limitations of evaluation and management by telemedicine. The patient expressed understanding and agreed to proceed.  Vital Signs: Because this visit was a virtual/telehealth visit, some criteria may be missing or patient reported. Any vitals not documented were not able to be obtained and vitals that have been documented are patient reported.  VideoDeclined- This patient declined Librarian, academic. Therefore the visit was completed with audio only.  Persons Participating in Visit: Patient.  AWV Questionnaire: No: Patient Medicare AWV questionnaire was not completed prior to this visit.  Cardiac Risk Factors include: advanced age (>33men, >31 women);dyslipidemia     Objective:    Today's Vitals   11/19/23 0931  Weight: 152 lb (68.9 kg)  Height: 5' 4 (1.626 m)   Body mass index is 26.09 kg/m.     11/19/2023    9:36 AM 01/21/2023   11:30 AM 11/13/2022    9:08 AM 10/17/2021   11:05 AM 09/30/2021   10:20 AM 02/10/2021    1:42 PM 12/09/2020    1:38 PM  Advanced Directives  Does Patient Have a Medical Advance Directive? No No No No No No No  Would patient like information on creating a medical advance directive? Yes (MAU/Ambulatory/Procedural Areas - Information given)  No - Patient declined  No - Patient declined No - Patient declined No - Patient declined     Current Medications (verified) Outpatient Encounter Medications as of 11/19/2023  Medication Sig   alendronate  (FOSAMAX ) 70 MG tablet TAKE 1 TABLET (70 MG TOTAL) BY MOUTH EVERY 7 DAYS WITH FULL GLASS WATER ON EMPTY STOMACH   fluconazole  (DIFLUCAN ) 150 MG tablet Take one tablet today; may repeat in 3 days if symptoms persist   fluticasone  (FLONASE ) 50 MCG/ACT nasal spray Place 2 sprays into both nostrils daily.   metroNIDAZOLE  (FLAGYL ) 500 MG tablet Take 1 tablet (500 mg total) by mouth 2 (two) times daily.   sertraline  (ZOLOFT ) 50 MG tablet Take 1 tablet (50 mg total) by mouth at bedtime.   tamoxifen  (NOLVADEX ) 20 MG tablet Take 1 tablet (20 mg total) by mouth daily.   No facility-administered encounter medications on file as of 11/19/2023.    Allergies (verified) Patient has no known allergies.   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  See old records   Osteoporosis 2022   Postmenopausal HRT (hormone replacement therapy) 03/14/2018   Menopause at age 18;    Past Surgical History:  Procedure Laterality Date   BREAST LUMPECTOMY WITH RADIOACTIVE SEED AND SENTINEL LYMPH NODE BIOPSY Left 12/29/2020   Procedure:  LEFT BREAST LUMPECTOMY WITH RADIOACTIVE SEED;  Surgeon: Lockie Rima, MD;  Location: [facility] OR;  Service: General;  Laterality: Left;   BREAST SURGERY  2022   Stage 1   SENTINEL NODE BIOPSY Left 12/29/2020   Procedure: LEFT AXILLARY SENTINEL NODE BIOPSY;  Surgeon: Lockie Rima, MD;  Location: [facility] OR;  Service: General;  Laterality: Left;   WISDOM TOOTH EXTRACTION     Family History  Problem Relation Age of Onset   Arthritis Mother    Hearing loss Mother    Osteoporosis Mother         significant   Stroke Father    Alcohol abuse Sister    Arthritis Sister    Cancer Sister        uterine; 12s   Social History   Socioeconomic History   Marital status: Married    Spouse name: Not on file   Number of children: Not on file   Years of education: Not on file   Highest education level: Bachelor's degree (e.g., BA, AB, BS)  Occupational History   Not on file  Tobacco Use   Smoking status: Never   Smokeless tobacco: Never  Vaping Use   Vaping status: Never Used  Substance and Sexual Activity   Alcohol use: Yes    Alcohol/week: 2.0 standard drinks of alcohol    Types: 1 Glasses of wine, 1 Shots of liquor per week   Drug use: Not Currently    Types: Marijuana   Sexual activity: Yes    Birth control/protection: Post-menopausal  Other Topics Concern   Not on file  Social History Narrative   Not on file   Social Drivers of Health   Financial Resource Strain: Low Risk  (11/19/2023)   Overall Financial Resource Strain (CARDIA)    Difficulty of Paying Living Expenses: Not hard at all  Food Insecurity: No Food Insecurity (11/19/2023)   Hunger Vital Sign    Worried About Running Out of Food in the Last Year: Never true    Ran Out of Food in the Last Year: Never true  Transportation Needs: No Transportation Needs (11/19/2023)   PRAPARE - Administrator, Civil Service (Medical): No    Lack of Transportation (Non-Medical): No  Physical Activity: Insufficiently Active (11/19/2023)   Exercise Vital Sign    Days of Exercise per Week: 2 days    Minutes of Exercise per Session: 60 min  Stress: No Stress Concern Present (11/19/2023)   [employer] of Occupational Health - Occupational Stress Questionnaire    Feeling of Stress: Not at all  Social Connections: Moderately Integrated (11/19/2023)   Social Connection and Isolation Panel    Frequency of Communication with Friends and Family: More than three times a week    Frequency of Social Gatherings with  Friends and Family: More than three times a week    Attends Religious Services: Patient declined    Database administrator or Organizations: No    Attends Engineer, structural: 1 to 4 times per year    Marital Status: Married    Tobacco Counseling Counseling given: Not Answered    Clinical Intake:  Pre-visit preparation completed: Yes  Pain : No/denies pain     Diabetes: No  No results found for: HGBA1C   How often do you need to have someone help you when you read instructions, pamphlets, or other written materials from your doctor or pharmacy?: 1 - Never  Interpreter Needed?: No  Information entered by ::  Seabron Cypress LPN   Activities of Daily Living     11/19/2023    9:36 AM  In your present state of health, do you have any difficulty performing the following activities:  Hearing? 0  Vision? 0  Difficulty concentrating or making decisions? 0  Walking or climbing stairs? 0  Dressing or bathing? 0  Doing errands, shopping? 0  Preparing Food and eating ? N  Using the Toilet? N  In the past six months, have you accidently leaked urine? N  Do you have problems with loss of bowel control? N  Managing your Medications? N  Managing your Finances? N  Housekeeping or managing your Housekeeping? N    Patient Care Team: Luevenia Saha, MD as PCP - General (Family Medicine) Magrinat, Rozella Cornfield, MD (Inactive) as Consulting Physician (Oncology) Lockie Rima, MD as Consulting Physician (General Surgery) Johna Myers, MD as Consulting Physician (Radiation Oncology) Haverstock, Thornell Flirt, MD as Referring Physician (Dermatology) Debbie Fails Laura Polio, NP as Nurse Practitioner (Hematology and Oncology) Dove, Natro D, RN as Registered Nurse Arther Larve, MD as Consulting Physician (Otolaryngology)  I have updated your Care Teams any recent Medical Services you may have received from other providers in the past year.     Assessment:   This is a  routine wellness examination for Nilaya.  Hearing/Vision screen Hearing Screening - Comments:: Denies hearing difficulties   Vision Screening - Comments:: Wears rx glasses - up to date with routine eye exams with Dr. Legrand Puma   Goals Addressed             This Visit's Progress    Remain active and independent   On track            Depression Screen     11/19/2023    9:26 AM 10/22/2023    9:33 AM 04/10/2023   10:56 AM 11/13/2022    9:06 AM 10/05/2022    9:40 AM 09/30/2021   10:19 AM 05/06/2021    2:38 PM  PHQ 2/9 Scores  PHQ - 2 Score 0 0 0 0 0 0 0    Fall Risk     11/19/2023    9:36 AM 10/22/2023    9:33 AM 04/10/2023   10:56 AM 11/13/2022    9:10 AM 10/13/2022    9:16 AM  Fall Risk   Falls in the past year? 0 0 1 0 0  Number falls in past yr: 0 0 0 0 0  Injury with Fall? 0 0 1 0 0  Comment   rib cage    Risk for fall due to : No Fall Risks No Fall Risks No Fall Risks Impaired vision   Follow up Falls prevention discussed;Education provided;Falls evaluation completed Falls evaluation completed Falls evaluation completed Falls prevention discussed     MEDICARE RISK AT HOME:  Medicare Risk at Home Any stairs in or around the home?: Yes If so, are there any without handrails?: No Home free of loose throw rugs in walkways, pet beds, electrical cords, etc?: Yes Adequate lighting in your home to reduce risk of falls?: Yes Life alert?: No Use of a cane, walker or w/c?: No Grab bars in the bathroom?: Yes Shower chair or bench in shower?: No Elevated toilet seat or a handicapped toilet?: Yes  TIMED UP AND GO:  Was the test performed?  No  Cognitive Function: 6CIT completed        11/19/2023    9:36 AM 11/13/2022    9:10  AM 09/30/2021   10:27 AM  6CIT Screen  What Year? 0 points 0 points 0 points  What month? 0 points 0 points 0 points  What time? 0 points 0 points 0 points  Count back from 20 0 points 0 points 0 points  Months in reverse 0 points 0 points 0  points  Repeat phrase 0 points 0 points 0 points  Total Score 0 points 0 points 0 points    Immunizations Immunization History  Administered Date(s) Administered   Fluad Quad(high Dose 65+) 05/18/2021, 03/29/2022   Influenza-Unspecified 02/25/2018   PFIZER(Purple Top)SARS-COV-2 Vaccination 08/28/2019, 09/18/2019, 05/17/2020   Pfizer Covid-19 Vaccine Bivalent Booster 55yrs & up 03/02/2021, 03/29/2022   Pneumococcal Conjugate-13 10/04/2020   Pneumococcal Polysaccharide-23 10/03/2021   Tdap 03/14/2018   Zoster Recombinant(Shingrix ) 02/26/2019, 05/21/2019    Screening Tests Health Maintenance  Topic Date Due   COVID-19 Vaccine (6 - 2024-25 season) 02/04/2023   MAMMOGRAM  11/16/2023   INFLUENZA VACCINE  01/04/2024   Medicare Annual Wellness (AWV)  11/18/2024   Colonoscopy  03/14/2025   DEXA SCAN  07/12/2025   DTaP/Tdap/Td (2 - Td or Tdap) 03/14/2028   Pneumococcal Vaccine: 50+ Years  Completed   Hepatitis C Screening  Completed   Zoster Vaccines- Shingrix   Completed   HPV VACCINES  Aged Out   Meningococcal B Vaccine  Aged Out    Health Maintenance  Health Maintenance Due  Topic Date Due   COVID-19 Vaccine (6 - 2024-25 season) 02/04/2023   MAMMOGRAM  11/16/2023    Additional Screening:  Vision Screening: Recommended annual ophthalmology exams for early detection of glaucoma and other disorders of the eye. Would you like a referral to an eye doctor? No    Dental Screening: Recommended annual dental exams for proper oral hygiene  Community Resource Referral / Chronic Care Management: CRR required this visit?  No   CCM required this visit?  No   Plan:    I have personally reviewed and noted the following in the patient's chart:   Medical and social history Use of alcohol, tobacco or illicit drugs  Current medications and supplements including opioid prescriptions. Patient is not currently taking opioid prescriptions. Functional ability and status Nutritional  status Physical activity Advanced directives List of other physicians Hospitalizations, surgeries, and ER visits in previous 12 months Vitals Screenings to include cognitive, depression, and falls Referrals and appointments  In addition, I have reviewed and discussed with patient certain preventive protocols, quality metrics, and best practice recommendations. A written personalized care plan for preventive services as well as general preventive health recommendations were provided to patient.   Seabron Cypress [place], [place]   1/61/0960   After Visit Summary: (MyChart) Due to this being a telephonic visit, the after visit summary with patients personalized plan was offered to patient via MyChart   Notes: Nothing significant to report at this time.

## 2023-11-20 ENCOUNTER — Ambulatory Visit: Payer: Medicare Other

## 2023-11-26 ENCOUNTER — Ambulatory Visit
Admission: RE | Admit: 2023-11-26 | Discharge: 2023-11-26 | Discharge status: Home / Self Care | Source: Ambulatory Visit | Attending: Oncology | Admitting: Oncology

## 2023-11-26 DIAGNOSIS — Z1231 Encounter for screening mammogram for malignant neoplasm of breast: Secondary | ICD-10-CM

## 2023-11-26 DIAGNOSIS — Z1239 Encounter for other screening for malignant neoplasm of breast: Secondary | ICD-10-CM

## 2023-11-26 HISTORY — DX: Personal history of irradiation: Z92.3

## 2023-11-29 ENCOUNTER — Other Ambulatory Visit: Payer: Self-pay | Admitting: Oncology

## 2023-11-29 ENCOUNTER — Encounter: Payer: Self-pay | Admitting: Oncology

## 2023-11-29 ENCOUNTER — Inpatient Hospital Stay: Payer: Medicare Other | Attending: Oncology | Admitting: Oncology

## 2023-11-29 VITALS — BP 107/66 | HR 63 | Temp 97.6°F | Resp 16 | Ht 64.0 in | Wt 153.9 lb

## 2023-11-29 DIAGNOSIS — C50512 Malignant neoplasm of lower-outer quadrant of left female breast: Secondary | ICD-10-CM | POA: Diagnosis not present

## 2023-11-29 DIAGNOSIS — R928 Other abnormal and inconclusive findings on diagnostic imaging of breast: Secondary | ICD-10-CM

## 2023-11-29 DIAGNOSIS — Z170 Estrogen receptor positive status [ER+]: Secondary | ICD-10-CM | POA: Insufficient documentation

## 2023-11-29 DIAGNOSIS — Z79899 Other long term (current) drug therapy: Secondary | ICD-10-CM | POA: Diagnosis not present

## 2023-11-29 DIAGNOSIS — Z923 Personal history of irradiation: Secondary | ICD-10-CM | POA: Insufficient documentation

## 2023-11-29 DIAGNOSIS — Z1721 Progesterone receptor positive status: Secondary | ICD-10-CM | POA: Diagnosis not present

## 2023-11-29 DIAGNOSIS — Z1732 Human epidermal growth factor receptor 2 negative status: Secondary | ICD-10-CM | POA: Insufficient documentation

## 2023-11-29 DIAGNOSIS — M810 Age-related osteoporosis without current pathological fracture: Secondary | ICD-10-CM | POA: Insufficient documentation

## 2023-11-29 MED ORDER — TAMOXIFEN CITRATE 20 MG PO TABS: 20.0000 mg | ORAL_TABLET | Freq: Every day | ORAL | 3 refills | Status: DC

## 2023-11-29 NOTE — Progress Notes (Signed)
 [facility]  [address] 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 This was treated with lumpectomy and adjuvant radiation. She was placed on hormonal therapy with Tamoxifen  in November 2022, and will continue this for a total of 5 years.  Osteoporosis, for which she started alendronate . DEXA scan done on 02/17/025 which revealed osteoporosis of the left and right hip with a T-score of -2.4 and -2.5 and normal spine with a T-score of -0.5.  Possible asymmetry of the left breast. This will be evaluated at the breast center with a diagnostic mammogram and ultrasound. They will proceed with a biopsy if appropriate. I will follow-up but I find no palpable lesion at this time.   Plan:  She continues Tamoxifen  20 mg daily without difficulty which I will refill today. She had a screening bilateral mammogram done on 11/26/2023 which revealed a possible asymmetry in the left breast. A diagnostic mammogram and ultrasound were scheduled on July, 3rd at the [facility] in [place] for further evaluation. During physical exam I felt no definite masses and advised her that if her diagnostic mammogram does find anything to go forward with the biopsy and I will see her after her pathology results. Patient had a DEXA scan done on 02/17/025 which revealed osteoporosis of the left and right hip with a T-score of -2.4 and -2.5 and normal spine with a T-score of -0.5. She continues alendronate  without difficulty other than some missed doses. I reassured her on the potential outcome and will see her back in 6 months for reevaluation. She understands and agrees with this plan of care. I have answered her questions and she knows to call with any concerns.  I provided 14 minutes of face-to-face time during this this  encounter and > 50% was spent counseling as documented under my assessment and plan.   Katrina VEAR Cornish, MD  [facility] - A DEPT OF [facility] [address] [place] [address] Dept: [phone] Dept Fax: [phone]   No orders of the defined types were placed in this encounter.  CHIEF COMPLAINT:  CC: Stage IA hormone receptor positive left breast cancer  Current Treatment:  Tamoxifen   HISTORY OF PRESENT ILLNESS:  Katrina Williamson is a 68 y.o. female who presents as a transfer of care from Dr. Tauna office for the continued management and treatment of stage IA (T1b N0 M0) hormone receptor positive invasive lobular carcinoma of the left breast, diagnosed in June 2022. This was found on screening mammogram from May 2022 which showed possible abnormality in the left breast. Diagnostic imaging from June confirmed a 0.9 mm mass in the left breast at 3:30 o'clock. No adenopathy was identified. Biopsy was pursued on June 14th and pathology revealed invasive mammary carcinoma, grade 2, and mammary carcinoma in situ. Estrogen receptor was positive at 95% and progesterone  receptor was positive at 90%. HER2 was negative 1+. Ki67 was 1%. Bilateral breast MRI from July revealed a biopsy proven malignancy measuring 1.1 cm within the left breast. Right breast was negative. She underwent lumpectomy with Dr. Aron on July 27th and final pathology from this procedure confirmed a grade 2 invasive lobular carcinoma, 1.4 cm, lobular neoplasia. All margins were negative. Two sentinel lymph nodes were negative for carcinoma (0/2). She did  have some mild complications after her procedure with opening of the incisions, which required packing. Oncotype revealed a recurrence score of 22, low risk, and predicts a risk of recurrence outside the breast over the next 9 years of 8%, if the patient's only systemic therapy is an antiestrogen for 5 years.  It also  predicted  no benefit from chemotherapy. She received adjuvant radiation to the left breast, and was started on adjuvant endocrine therapy with tamoxifen  in November 2022, with plans of 5 years of therapy.    I have reviewed her chart and materials related to her cancer extensively and collaborated history with the patient. Summary of oncologic history is as follows: Oncology History  Malignant neoplasm of lower-outer quadrant of left breast of female, estrogen receptor positive (HCC)  11/29/2020 Initial Diagnosis   Malignant neoplasm of lower-outer quadrant of left breast of female, estrogen receptor positive (HCC)   11/30/2020 Cancer Staging   Staging form: Breast, AJCC 8th Edition - Clinical: Stage IA (cT1b, cN0, cM0, G2, ER+, PR+, HER2-) - Signed by Layla Sandria BROCKS, MD on 11/30/2020 Histologic grading system: 3 grade system   12/29/2020 Surgery   Left breast lumpectomy with sentinel lymph node. Two lymph nodes biopsied, both negative for carcinoma.    Radiation Therapy   03/08/2021 through 04/04/2021 Site Technique Total Dose (Gy) Dose per Fx (Gy) Completed Fx Beam Energies  Breast, Left: Breast_Lt 3D 42.56/42.56 2.66 16/16 6X  Breast, Left: Breast_Lt_Bst 3D 8/8 2 4/4 6X     Surgery       INTERVAL HISTORY:  Katrina Williamson is here today for repeat clinical assessment of stage stage IA (T1b N0 M0) hormone receptor positive left breast cancer. She was diagnosed in June, 2022. Patient states that she is nervous due to recent mammogram results. She complains of generalized pain all over but tolerable. She had a screening bilateral mammogram done on 11/26/2023 which revealed a possible asymmetry in the left breast. A diagnostic mammogram and ultrasound were scheduled on July, 3rd at the [facility] in [place] for further evaluation. During physical exam I felt no definite masses and advised her that if her diagnostic mammogram does find anything to go forward with the biopsy and I will see her  after her pathology results. Patient had a DEXA scan done on 02/17/025 which revealed osteoporosis of the left and right hip with a T-score of -2.4 and -2.5 and normal spine with a T-score of -0.5. She continues alendronate  without difficulty other than some missed doses. I reassured her on the potential outcome and will see her back in 6 months for reevaluation. I will also refill her Tamoxifen  20 mg daily. She denies fever, chills, night sweats, or other signs of infection. She denies cardiorespiratory and gastrointestinal issues.  Her appetite is good and Her weight has increased 1 pounds over last month. This patient is accompanied in the office by her husband.   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  See old records   Osteoporosis 2022   Personal history of radiation therapy    Postmenopausal HRT (hormone replacement therapy) 03/14/2018   Menopause at age  45;    Past Surgical History:  Procedure Laterality Date   BREAST LUMPECTOMY Left 2022   BREAST LUMPECTOMY WITH RADIOACTIVE SEED AND SENTINEL LYMPH NODE BIOPSY Left 12/29/2020   Procedure: LEFT BREAST LUMPECTOMY WITH RADIOACTIVE SEED;  Surgeon: Aron Shoulders, MD;  Location: [facility] OR;  Service: General;  Laterality: Left;   BREAST SURGERY  2022   Stage 1   SENTINEL NODE BIOPSY Left 12/29/2020   Procedure: LEFT AXILLARY SENTINEL NODE BIOPSY;  Surgeon: Aron Shoulders, MD;  Location: [facility] OR;  Service: General;  Laterality: Left;   WISDOM TOOTH EXTRACTION     Family History  Problem Relation Age of Onset   Arthritis Mother    Hearing loss Mother    Osteoporosis Mother        significant   Stroke Father    Alcohol abuse Sister    Arthritis Sister     Cancer Sister        uterine; 28s   Social History:  reports that she has never smoked. She has never used smokeless tobacco. She reports current alcohol use of about 2.0 standard drinks of alcohol per week. She reports that she does not currently use drugs after having used the following drugs: Marijuana. She is divorced and lives at home. She is nulliparous. 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  No Known Allergies  Current Medications: Current Outpatient Medications  Medication Sig Dispense Refill   alendronate  (FOSAMAX ) 70 MG tablet Take 1 tablet (70 mg total) by mouth every 7 (seven) days. Take with a full glass of water on an empty stomach. 12 tablet 3   fluticasone  (FLONASE ) 50 MCG/ACT nasal spray Place 2 sprays into both nostrils daily.     sertraline  (ZOLOFT ) 50 MG tablet Take 1 tablet (50 mg total) by mouth at bedtime. 90 tablet 3   tamoxifen  (NOLVADEX ) 20 MG tablet Take 1 tablet (20 mg total) by mouth daily. 90 tablet 3   No current facility-administered medications for this visit.    REVIEW OF SYSTEMS:  Review of Systems  Constitutional: Negative.  Negative for appetite change, chills, diaphoresis, fatigue, fever and unexpected weight change.  HENT:  Negative.  Negative for hearing loss, lump/mass, mouth sores, nosebleeds, sore throat, tinnitus, trouble swallowing and voice change.   Eyes: Negative.  Negative for eye problems and icterus.  Respiratory: Negative.  Negative for chest tightness, cough, hemoptysis, shortness of breath and wheezing.   Cardiovascular: Negative.  Negative for chest pain, leg swelling and palpitations.  Gastrointestinal: Negative.  Negative for abdominal distention, abdominal pain, blood in stool, constipation, diarrhea, nausea, rectal pain and vomiting.  Endocrine: Negative.   Genitourinary:  Negative for bladder incontinence, difficulty urinating, dyspareunia, dysuria, frequency, hematuria,  menstrual problem, nocturia, pelvic pain, vaginal bleeding and vaginal discharge.   Musculoskeletal: Negative.  Negative for arthralgias, back pain, flank pain, gait problem, myalgias, neck pain and neck stiffness.       Generalized pain all over  Skin: Negative.  Negative for itching, rash and wound.  Neurological:  Negative for dizziness, extremity weakness, gait problem, headaches, light-headedness, numbness, seizures and speech difficulty.  Hematological: Negative.  Negative for adenopathy. Does not bruise/bleed easily.  Psychiatric/Behavioral: Negative.  Negative for confusion, decreased concentration, depression, sleep disturbance and suicidal ideas. The patient is not nervous/anxious.    VITALS:   Vitals:   11/29/23 1505  BP: 107/66  Pulse: 63  Resp: 16  Temp: 97.6 F (36.4 C)  SpO2: 100%   Wt Readings from Last 3 Encounters:  11/29/23 153 lb 14.4 oz (69.8 kg)  11/19/23 152 lb (68.9 kg)  10/22/23 152 lb 12.8 oz (69.3 kg)   Performance status (ECOG): 1 - Symptomatic but completely ambulatory  PHYSICAL EXAM:  Physical Exam Vitals and nursing note reviewed. Exam conducted with a chaperone present.  Constitutional:      General: She is not in acute distress.    Appearance: Normal appearance. She is normal weight. She is not ill-appearing, toxic-appearing or diaphoretic.  HENT:     Head: Normocephalic and atraumatic.     Right Ear: Tympanic membrane, ear canal and external ear normal. There is no impacted cerumen.     Left Ear: Tympanic membrane, ear canal and external ear normal. There is no impacted cerumen.     Nose: Nose normal. No congestion or rhinorrhea.     Mouth/Throat:     Mouth: Mucous membranes are moist.     Pharynx: Oropharynx is clear. No oropharyngeal exudate or posterior oropharyngeal erythema.  Eyes:     General: No scleral icterus.       Right eye: No discharge.        Left eye: No discharge.     Extraocular Movements: Extraocular movements intact.      Conjunctiva/sclera: Conjunctivae normal.     Pupils: Pupils are equal, round, and reactive to light.  Neck:     Vascular: No carotid bruit.  Cardiovascular:     Rate and Rhythm: Normal rate and regular rhythm.     Pulses: Normal pulses.     Heart sounds: Normal heart sounds. No murmur heard.    No friction rub. No gallop.  Pulmonary:     Effort: Pulmonary effort is normal. No respiratory distress.     Breath sounds: Normal breath sounds. No stridor. No wheezing, rhonchi or rales.  Chest:     Chest wall: No tenderness.     Comments: Well healed scar in the lateral left breast at approximately 3 o'clock. Well healed left axillary scar.  No masses in either breasts but occasional areas of firmness on the left side Abdominal:     General: Bowel sounds are normal. There is no distension.     Palpations: Abdomen is soft. There is no hepatomegaly, splenomegaly or mass.     Tenderness: There is no abdominal tenderness. There is no right CVA tenderness, left CVA tenderness, guarding or rebound.     Hernia: No hernia is present.  Musculoskeletal:        General: No swelling, tenderness, deformity or signs of injury. Normal range of motion.     Cervical back: Normal range of motion and neck supple. No rigidity or tenderness.     Right lower leg: No edema.     Left lower leg: No edema.  Lymphadenopathy:     Cervical: No cervical adenopathy.     Right cervical: No superficial, deep or posterior cervical adenopathy.    Left cervical: No superficial, deep or posterior cervical adenopathy.     Upper Body:     Right upper body: No supraclavicular, axillary or pectoral adenopathy.     Left upper body: No supraclavicular, axillary or pectoral adenopathy.  Skin:    General: Skin is warm and dry.     Coloration: Skin is not jaundiced or pale.     Findings: No bruising, erythema, lesion or rash.  Neurological:     General: No focal deficit present.  Mental Status: She is alert and oriented to  person, place, and time. Mental status is at baseline.     Cranial Nerves: No cranial nerve deficit.     Sensory: No sensory deficit.     Motor: No weakness.     Coordination: Coordination normal.     Gait: Gait normal.     Deep Tendon Reflexes: Reflexes normal.  Psychiatric:        Mood and Affect: Mood normal.        Behavior: Behavior normal.        Thought Content: Thought content normal.        Judgment: Judgment normal.    LABS:      Latest Ref Rng & Units 10/22/2023   10:03 AM 01/21/2023   11:36 AM 10/05/2022   10:24 AM  CBC  WBC 4.0 - 10.5 K/uL 5.6  4.9  11.7   Hemoglobin 12.0 - 15.0 g/dL 86.1  87.8  86.6   Hematocrit 36.0 - 46.0 % 41.6  37.5  40.3   Platelets 150.0 - 400.0 K/uL 234.0  158  234.0    Lab Results  Component Value Date   CREATININE 0.96 10/22/2023   BUN 15 10/22/2023   NA 140 10/22/2023   K 4.0 10/22/2023   CL 106 10/22/2023   CO2 27 10/22/2023      Component Value Date/Time   PROT 6.8 10/22/2023 1003   ALBUMIN 4.4 10/22/2023 1003   AST 19 10/22/2023 1003   AST 18 04/25/2022 1049   ALT 18 10/22/2023 1003   ALT 15 04/25/2022 1049   ALKPHOS 62 10/22/2023 1003   BILITOT 0.6 10/22/2023 1003   BILITOT 0.8 04/25/2022 1049    Lab Results  Component Value Date   TSH 2.61 10/22/2023   Lab Results  Component Value Date   VD25OH 30.08 10/22/2023   STUDIES:  EXAM: 11/26/2023 DIGITAL SCREENING BILATERAL MAMMOGRAM WITH TOMOSYNTHESIS AND CAD IMPRESSION: Further evaluation is suggested for possible asymmetry in the left breast. RECOMMENDATION: Diagnostic mammogram and possibly ultrasound of the left breast.  EXAM: 07/13/2023 DUAL X-RAY ABSORPTIOMETRY (DXA) FOR BONE MINERAL DENSITY  LUMBAR SPINE (L1, L4): BMD (in g/cm2): 1.101 T-score: -0.5 Z-score: 1.1  LEFT FEMORAL NECK BMD (in g/cm2): 0.699 T-score: -2.4 Z-score: -0.9   LEFT TOTAL HIP: BMD (in g/cm2): 0.737 T-score: -2.1 Z-score: -0.8   RIGHT FEMORAL NECK: BMD (in g/cm2):  0.686 T-score: -2.5 Z-score: -1.0   RIGHT TOTAL HIP: BMD (in g/cm2): 0.727 T-score: -2.2 Z-score: -0.9 IMPRESSION: Osteoporosis based on BMD.     I,Jasmine M Lassiter,acting as a scribe for Katrina VEAR Cornish, MD.,have documented all relevant documentation on the behalf of Katrina VEAR Cornish, MD,as directed by  Katrina VEAR Cornish, MD while in the presence of Katrina VEAR Cornish, MD.  I have reviewed this report as typed by the medical scribe, and it is complete and accurate.

## 2023-11-30 ENCOUNTER — Other Ambulatory Visit: Payer: Self-pay | Admitting: Oncology

## 2023-11-30 ENCOUNTER — Telehealth: Payer: Self-pay | Admitting: Oncology

## 2023-11-30 ENCOUNTER — Encounter: Payer: Self-pay | Admitting: Oncology

## 2023-11-30 DIAGNOSIS — C50512 Malignant neoplasm of lower-outer quadrant of left female breast: Secondary | ICD-10-CM

## 2023-11-30 MED ORDER — TAMOXIFEN CITRATE 20 MG PO TABS: 20.0000 mg | ORAL_TABLET | Freq: Every day | ORAL | 3 refills | Status: DC

## 2023-11-30 NOTE — Telephone Encounter (Signed)
 Patient has been scheduled for follow-up visit per 11/28/23 LOS.  LVM notifying pt of appt details, provided my direct number to pt if appt changes need to be made.

## 2023-12-06 ENCOUNTER — Ambulatory Visit

## 2023-12-06 ENCOUNTER — Ambulatory Visit
Admission: RE | Admit: 2023-12-06 | Discharge: 2023-12-06 | Disposition: A | Discharge status: Home / Self Care | Source: Ambulatory Visit | Attending: Oncology | Admitting: Oncology

## 2023-12-06 DIAGNOSIS — R928 Other abnormal and inconclusive findings on diagnostic imaging of breast: Secondary | ICD-10-CM

## 2023-12-06 DIAGNOSIS — Z9012 Acquired absence of left breast and nipple: Secondary | ICD-10-CM | POA: Diagnosis not present

## 2023-12-08 ENCOUNTER — Other Ambulatory Visit: Payer: Self-pay | Admitting: Family Medicine

## 2023-12-13 ENCOUNTER — Telehealth: Payer: Self-pay

## 2023-12-13 NOTE — Telephone Encounter (Signed)
 Attempted to contact patient. No answer.

## 2023-12-13 NOTE — Telephone Encounter (Signed)
-----   Message from Wanda VEAR Cornish sent at 12/13/2023  7:28 AM EDT ----- Regarding: call Repeat mammo looks good, I will see her in 6 months as scheduled

## 2023-12-18 ENCOUNTER — Telehealth: Payer: Self-pay

## 2023-12-18 NOTE — Telephone Encounter (Signed)
 Attempted to contact patient. Will try back at another time.

## 2023-12-18 NOTE — Telephone Encounter (Signed)
-----   Message from Wanda VEAR Cornish sent at 12/13/2023  7:28 AM EDT ----- Regarding: call Repeat mammo looks good, I will see her in 6 months as scheduled

## 2023-12-21 ENCOUNTER — Other Ambulatory Visit: Payer: Medicare Other

## 2023-12-24 ENCOUNTER — Ambulatory Visit: Admitting: Internal Medicine

## 2023-12-25 ENCOUNTER — Telehealth: Payer: Self-pay

## 2023-12-25 NOTE — Telephone Encounter (Signed)
 Attempted to contact patient. Unable to reach patient and no VM

## 2023-12-25 NOTE — Telephone Encounter (Signed)
-----   Message from Wanda VEAR Cornish sent at 12/13/2023  7:28 AM EDT ----- Regarding: call Repeat mammo looks good, I will see her in 6 months as scheduled

## 2024-01-25 ENCOUNTER — Encounter: Payer: Self-pay | Admitting: Family Medicine

## 2024-01-31 ENCOUNTER — Other Ambulatory Visit (HOSPITAL_COMMUNITY)
Admission: RE | Admit: 2024-01-31 | Discharge: 2024-01-31 | Disposition: A | Discharge status: Home / Self Care | Source: Ambulatory Visit | Attending: Family Medicine | Admitting: Family Medicine

## 2024-01-31 ENCOUNTER — Ambulatory Visit (INDEPENDENT_AMBULATORY_CARE_PROVIDER_SITE_OTHER): Admitting: Family Medicine

## 2024-01-31 VITALS — BP 101/67 | HR 66 | Temp 97.7°F | Ht 64.0 in | Wt 152.0 lb

## 2024-01-31 DIAGNOSIS — N898 Other specified noninflammatory disorders of vagina: Secondary | ICD-10-CM

## 2024-01-31 DIAGNOSIS — B0059 Other herpesviral disease of eye: Secondary | ICD-10-CM

## 2024-01-31 MED ORDER — VALACYCLOVIR HCL 1 G PO TABS: 1000.0000 mg | ORAL_TABLET | Freq: Two times a day (BID) | ORAL | 2 refills | Status: AC

## 2024-01-31 NOTE — Patient Instructions (Addendum)
 Please follow up if symptoms do not improve or as needed.    Vaginitis You will learn about the causes symptoms, and treatment for the 3 main types of vaginitis; vaginosis, yeast infection and trichomoniasis. To view the content, go to this web address: [email or web address]  This video will expire on: 05/16/2025. If you need access to this video following this date, please reach out to the healthcare provider who assigned it to you. This information is not intended to replace advice given to you by your health care provider. Make sure you discuss any questions you have with your health care provider. Elsevier Patient Education  2024 [employer].

## 2024-01-31 NOTE — Progress Notes (Signed)
 Subjective  CC:  Chief Complaint  Patient presents with   Vaginal Discharge    Pt stated that she has had some vaginal discharge for the past 2 weeks. Also she stated that she has a herps outbreak ober her eyes    HPI: Katrina Williamson is a 68 y.o. female who presents to the office today to address the problems listed above in the chief complaint. Discussed the use of AI scribe software for clinical note transcription with the patient, who gave verbal consent to proceed.  History of Present Illness Katrina Williamson is a 68 year old female who presents with recurrent vaginal infections and herpes zoster ophthalmicus.  She has been experiencing recurrent vaginal infections, similar to an episode in May that was both bacterial and yeast in nature. She has not had such infections for decades and is concerned about potential changes in her husband's semen following his prostate cancer treatment with radioactive seeds. She has been on tamoxifen  since November 2022 and wonders if it has affected her vaginal flora. Her symptoms include a week and a half of white discharge without itching, irritation, pain, burning, or odor.  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 This episode began with intense pain behind her eye on Saturday or Sunday, followed by a blister on Monday. The blister crusted over, and she experienced blurry vision and puffiness around the eye. The pain is always in the same location, and she is concerned about the proximity to her eye. She has not been previously treated with Valtrex  for this condition. She has received the shingles vaccine.   Assessment  1. Herpes simplex dermatitis of eyelid   2. 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 Blurry vision and eye puffiness noted. No nasal or tip involvement, reducing risk of direct ocular complications. Potential ocular complications if virus affects eye-associated nerve root. - Prescribed Valtrex  (valacyclovir ) twice times daily for one week. - Provided sufficient Valtrex  for future episodes to initiate treatment at symptom onset. - Instructed to seek immediate ophthalmological evaluation if blisters or rash appear on the nose, especially the tip. - Advised consultation with an ophthalmologist to ensure eye health.  Recurrent vaginal infection (bacterial and/or yeast) Recurrent vaginal infections, previously bacterial and yeast. Current symptoms include white discharge without other symptoms. Tamoxifen  may alter vaginal flora. She prefers to avoid frequent antibiotics. - Perform a vaginal swab to determine the current infection type. - Advise incorporating yogurt with high lactobacilli count into diet to help restore healthy vaginal flora. - Discuss the possibility of using lactobacilli tablets as an alternative to yogurt. - Delay treatment until swab results are available unless symptoms worsen.    Follow up: prn No orders of the defined types were placed in this encounter.  Meds ordered this encounter  Medications   valACYclovir  (VALTREX ) 1000 MG tablet    Sig: Take 1 tablet (1,000 mg total) by mouth 2 (two) times daily for 7 days. Use as needed for outbreaks    Dispense:  30 tablet    Refill:  2     I reviewed the patients updated PMH, FH, and SocHx.  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   sertraline  (ZOLOFT ) 50 MG tablet TAKE 1 TABLET BY MOUTH EVERYDAY AT BEDTIME   tamoxifen  (NOLVADEX ) 20 MG tablet Take 1 tablet (20 mg total) by mouth daily.   valACYclovir  (VALTREX ) 1000 MG tablet Take 1 tablet (1,000 mg total) by mouth 2 (two) times daily for 7 days. Use as needed for outbreaks   Allergies: Patient has no known allergies. Family History: Patient family history includes Alcohol abuse in her sister; Arthritis in her mother and sister; Cancer in her sister; Hearing loss in her mother; Osteoporosis in her mother; Stroke in her father. Social History:  Patient  reports that she has never smoked. She has never used smokeless tobacco. She reports current alcohol use of about 2.0 standard drinks of alcohol per week. She reports that she does not currently use drugs after having used the following drugs: Marijuana.  Review of Systems: Constitutional: Negative for fever malaise or anorexia Cardiovascular: negative for chest pain Respiratory: negative for SOB or persistent cough Gastrointestinal: negative for abdominal pain  Objective  Vitals: BP 101/67   Pulse 66   Temp 97.7 F (36.5 C)   Ht 5' 4 (1.626 m)   Wt 152 lb (68.9 kg)   SpO2 97%   BMI 26.09 kg/m  General: no acute distress , A&Ox3 HEENT: conjunctiva normal. No photophobia.  Skin:  Warm, crusted lesion just below left eye, medial canthus [place] side effects, risks, benefits, and alternatives  for medications and treatment plan prescribed today were discussed, and the patient expressed understanding of the given instructions. Patient is instructed to call or message via MyChart if he/she has any questions or concerns regarding our treatment plan. No barriers to understanding were identified. We discussed Red Flag symptoms and signs in detail. Patient expressed understanding regarding what to do in case of urgent or emergency type symptoms.  Medication list was reconciled, printed and provided to the patient in AVS. Patient instructions and summary information was reviewed with the patient as documented in the AVS. This note was prepared with assistance of Dragon voice recognition software. Occasional wrong-word or sound-a-like substitutions may have occurred due to the inherent limitations of voice recognition software

## 2024-02-05 ENCOUNTER — Ambulatory Visit: Payer: Self-pay | Admitting: Family Medicine

## 2024-02-05 LAB — CERVICOVAGINAL ANCILLARY ONLY
Bacterial Vaginitis (gardnerella): POSITIVE — AB
Candida Glabrata: POSITIVE — AB
Candida Vaginitis: NEGATIVE
Comment: NEGATIVE
Comment: NEGATIVE
Comment: NEGATIVE
Comment: NEGATIVE
Trichomonas: NEGATIVE

## 2024-02-05 MED ORDER — METRONIDAZOLE 500 MG PO TABS: 500.0000 mg | ORAL_TABLET | Freq: Two times a day (BID) | ORAL | 0 refills | Status: DC

## 2024-02-05 MED ORDER — MICONAZOLE NITRATE 2 % VA CREA: 1.0000 | TOPICAL_CREAM | Freq: Every day | VAGINAL | 0 refills | Status: DC

## 2024-02-05 NOTE — Progress Notes (Signed)
 See mychart note    Dear Ms. Dimon, Your swab again shows both BV and a type of yeast infection. I will send in medications; I will use a nystatin cream to treat this type of candida (yeast) since fluconazole  can be less effective and I would like to prevent recurrence. We will need the antibiotic again to treat the BV.  We may be able to use boric acid suppositories that you can get at some pharmacies or at a compounding pharmacy if we can't get this all cleared and not recurring.  Keep an eye on your symptoms and let me know if things do not clear completely or if they return.  Sincerely, Dr. Jodie

## 2024-02-18 NOTE — Progress Notes (Signed)
 02/19/24- 68 yoF never smoker for sleep evaluation courtesy of Dr Lavern Heck with concern of possible sleep apnea. Medical problems include Allergic Rhinitis, Breast Ca L, Hyperlipidemia, Anxiety,  Epworth score-12 Body Weight today-154 lbs Discussed the use of AI scribe software for clinical note transcription with the patient, who gave verbal consent to proceed.  History of Present Illness   Katrina Williamson is a 68 year old female who presents with concerns of sleep apnea and excessive daytime sleepiness. She is accompanied by her husband.  She experiences loud snoring and episodes of breath-holding during sleep, particularly around 4 AM. Her husband has observed these symptoms. She occasionally wakes herself up after holding her breath.  She experiences excessive daytime sleepiness despite sleeping nine hours at night and taking a two-hour nap. She struggles to stay awake while reading or after lunch but does not fall asleep while driving.  She predominantly sleeps on her back due to bulging discs in her neck, which helps reduce headaches but increases snoring. A deviated septum has not been repaired due to potential complications.     Prior to Admission medications   Medication Sig Start Date End Date Taking? Authorizing Provider  alendronate  (FOSAMAX ) 70 MG tablet TAKE 1 TABLET (70 MG TOTAL) BY MOUTH EVERY 7 DAYS WITH FULL GLASS WATER ON EMPTY STOMACH 05/23/23  Yes Heck Lavern CROME, MD  fluticasone  (FLONASE ) 50 MCG/ACT nasal spray Place 2 sprays into both nostrils daily. 07/18/22  Yes Heck Lavern CROME, MD  sertraline  (ZOLOFT ) 50 MG tablet TAKE 1 TABLET BY MOUTH EVERYDAY AT BEDTIME 12/09/23  Yes Heck Lavern CROME, MD  tamoxifen  (NOLVADEX ) 20 MG tablet Take 1 tablet (20 mg total) by mouth daily. 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  See old records   Osteoporosis 2022   Personal history of radiation therapy    Postmenopausal HRT (hormone replacement therapy) 03/14/2018   Menopause at age 47;    Past Surgical History:  Procedure Laterality Date   BREAST LUMPECTOMY Left 2022   BREAST LUMPECTOMY WITH RADIOACTIVE SEED AND SENTINEL LYMPH NODE BIOPSY Left 12/29/2020   Procedure: LEFT BREAST LUMPECTOMY WITH RADIOACTIVE SEED;  Surgeon: Aron Shoulders, MD;  Location: [facility] OR;  Service: General;  Laterality: Left;   BREAST SURGERY  2022   Stage 1   SENTINEL NODE BIOPSY Left 12/29/2020   Procedure: LEFT AXILLARY SENTINEL NODE BIOPSY;  Surgeon: Aron Shoulders, MD;  Location: [facility] OR;  Service: General;  Laterality: Left;   WISDOM TOOTH EXTRACTION     Family History  Problem Relation Age of Onset   Arthritis Mother    Hearing loss Mother    Osteoporosis Mother        significant   Stroke Father    Alcohol abuse Sister    Arthritis Sister    Cancer Sister        uterine; 85s   Social History   Socioeconomic History   Marital status: Married    Spouse name: Not on file   Number of children: Not on file   Years of education: Not on  file   Highest education level: Bachelor's degree (e.g., BA, AB, BS)  Occupational History   Not on file  Tobacco Use   Smoking status: Never   Smokeless tobacco: Never  Vaping Use   Vaping status: Never Used  Substance and Sexual Activity   Alcohol use: Yes    Alcohol/week: 2.0 standard drinks of alcohol    Types: 1 Glasses of wine, 1 Shots of liquor per week   Drug use: Not Currently    Types: Marijuana   Sexual activity: Yes    Birth control/protection: Post-menopausal  Other Topics Concern   Not on file  Social History Narrative   Not on file   Social Drivers of Health   Financial Resource Strain: Low Risk   (01/31/2024)   Overall Financial Resource Strain (CARDIA)    Difficulty of Paying Living Expenses: Not hard at all  Food Insecurity: No Food Insecurity (01/31/2024)   Hunger Vital Sign    Worried About Running Out of Food in the Last Year: Never true    Ran Out of Food in the Last Year: Never true  Transportation Needs: No Transportation Needs (01/31/2024)   PRAPARE - Administrator, Civil Service (Medical): No    Lack of Transportation (Non-Medical): No  Physical Activity: Insufficiently Active (01/31/2024)   Exercise Vital Sign    Days of Exercise per Week: 2 days    Minutes of Exercise per Session: 40 min  Stress: No Stress Concern Present (11/19/2023)   [employer] of Occupational Health - Occupational Stress Questionnaire    Feeling of Stress: Not at all  Social Connections: Moderately Isolated (01/31/2024)   Social Connection and Isolation Panel    Frequency of Communication with Friends and Family: Once a week    Frequency of Social Gatherings with Friends and Family: Once a week    Attends Religious Services: Never    Database administrator or Organizations: Yes    Attends Banker Meetings: 1 to 4 times per year    Marital Status: Married  Catering manager Violence: Not At Risk (11/19/2023)   Humiliation, Afraid, Rape, and Kick questionnaire    Fear of Current or Ex-Partner: No    Emotionally Abused: No    Physically Abused: No    Sexually Abused: No   Assessment and Plan:    Suspected obstructive sleep apnea with associated daytime sleepiness and snoring Suspected obstructive sleep apnea due to snoring, daytime sleepiness, and apneic episodes. Deviated septum noted. Discussed risks of untreated sleep apnea and treatment options including CPAP, mouthpiece, and surgery. Emphasized shared decision-making. - Order home sleep test. - Await insurance clearance for home sleep test, expected within a week. - Instruct her to call the office two weeks  after the study for results. - Discuss treatment options based on test results, including CPAP, mouthpiece, and surgery if necessary.     ROS-see HPI   + = positive Constitutional:    weight loss, night sweats, fevers, chills, fatigue, lassitude. HEENT:    +headaches, difficulty swallowing, tooth/dental problems, sore throat,       +sneezing, itching, +ear ache, +nasal congestion, post nasal drip, snoring CV:    chest pain, orthopnea, PND, swelling in lower extremities, anasarca,                                   dizziness, palpitations Resp:   +shortness of breath with exertion  or at rest.                productive cough,   non-productive cough, coughing up of blood.              change in color of mucus.  wheezing.   Skin:    rash or lesions. GI:  No-   heartburn, indigestion, abdominal pain, nausea, vomiting, diarrhea,                 change in bowel habits, loss of appetite GU: dysuria, change in color of urine, no urgency or frequency.   flank pain. MS:   joint pain, +stiffness, decreased range of motion, back pain. Neuro-     nothing unusual Psych:  change in mood or affect.  depression or anxiety.   memory loss.  OBJ- Physical Exam General- Alert, Oriented, Affect-appropriate, Distress- none acute Skin- rash-none, lesions- none, excoriation- none Lymphadenopathy- none Head- atraumatic            Eyes- Gross vision intact, PERRLA, conjunctivae and secretions clear            Ears- Hearing, canals-normal            Nose- Clear, no-Septal dev, mucus, polyps, erosion, perforation             Throat- Mallampati III , mucosa clear , drainage- none, tonsils- atrophic, +teeth Neck- flexible , trachea midline, no stridor , thyroid  nl, carotid no bruit Chest - symmetrical excursion , unlabored           Heart/CV- RRR , no murmur , no gallop  , no rub, nl s1 s2                           - JVD- none , edema- none, stasis changes- none, varices- none           Lung- clear to P&A, wheeze-  none, cough- none , dullness-none, rub- none           Chest wall-  Abd-  Br/ Gen/ Rectal- Not done, not indicated Extrem- cyanosis- none, clubbing, none, atrophy- none, strength- nl Neuro- grossly intact to observation

## 2024-02-19 ENCOUNTER — Ambulatory Visit: Admitting: Internal Medicine

## 2024-02-19 ENCOUNTER — Encounter: Payer: Self-pay | Admitting: Internal Medicine

## 2024-02-19 VITALS — BP 114/69 | HR 67 | Temp 97.6°F | Ht 64.0 in | Wt 154.0 lb

## 2024-02-19 DIAGNOSIS — R0683 Snoring: Secondary | ICD-10-CM

## 2024-02-19 DIAGNOSIS — R400 Somnolence: Secondary | ICD-10-CM | POA: Diagnosis not present

## 2024-02-19 NOTE — Patient Instructions (Signed)
 Order- schedule home sleep test     dx somnolence  Please call us  about 2 weeks after your sleep test for results an recommendations

## 2024-02-23 ENCOUNTER — Encounter: Payer: Self-pay | Admitting: Internal Medicine

## 2024-02-28 ENCOUNTER — Encounter: Payer: Self-pay | Admitting: Internal Medicine

## 2024-02-29 ENCOUNTER — Encounter: Payer: Self-pay | Admitting: Family Medicine

## 2024-03-11 ENCOUNTER — Encounter

## 2024-03-11 DIAGNOSIS — R400 Somnolence: Secondary | ICD-10-CM

## 2024-03-11 DIAGNOSIS — G4733 Obstructive sleep apnea (adult) (pediatric): Secondary | ICD-10-CM | POA: Diagnosis not present

## 2024-03-14 DIAGNOSIS — Z23 Encounter for immunization: Secondary | ICD-10-CM | POA: Diagnosis not present

## 2024-03-26 DIAGNOSIS — R400 Somnolence: Secondary | ICD-10-CM | POA: Diagnosis not present

## 2024-03-26 DIAGNOSIS — G4730 Sleep apnea, unspecified: Secondary | ICD-10-CM | POA: Diagnosis not present

## 2024-04-10 ENCOUNTER — Ambulatory Visit (INDEPENDENT_AMBULATORY_CARE_PROVIDER_SITE_OTHER)

## 2024-04-10 ENCOUNTER — Ambulatory Visit (INDEPENDENT_AMBULATORY_CARE_PROVIDER_SITE_OTHER): Payer: Self-pay | Admitting: Podiatry

## 2024-04-10 DIAGNOSIS — M19072 Primary osteoarthritis, left ankle and foot: Secondary | ICD-10-CM

## 2024-04-10 DIAGNOSIS — M778 Other enthesopathies, not elsewhere classified: Secondary | ICD-10-CM | POA: Diagnosis not present

## 2024-04-10 DIAGNOSIS — M19071 Primary osteoarthritis, right ankle and foot: Secondary | ICD-10-CM | POA: Diagnosis not present

## 2024-04-10 DIAGNOSIS — M7752 Other enthesopathy of left foot: Secondary | ICD-10-CM | POA: Diagnosis not present

## 2024-04-10 DIAGNOSIS — M7751 Other enthesopathy of right foot: Secondary | ICD-10-CM | POA: Diagnosis not present

## 2024-04-10 NOTE — Progress Notes (Signed)
 Chief Complaint  Patient presents with   Foot Ulcer    Bilateral foot pain with the right foot being the worst, pian is located in the mid-fore foot just below the toes. Pain present for a year. She fell last Oct and year ago but denies injury to the foot.  Not diabetic and no anti coag.    Discussed the use of AI scribe software for clinical note transcription with the patient, who gave verbal consent to proceed.  History of Present Illness Katrina Williamson is a 68 year old female who presents with pain on the top of her right foot.  She experiences pain on the top of her right foot, which worsens with activities such as ballet and walking barefoot. The pain is alleviated by wearing shoes and taking Advil. She recalls tripping and falling in October of last year but does not believe this incident impacted her feet due to wearing hiking boots at the time. The pain might have started before the fall and is described as 'hurts badly', with a possible progressive worsening. She takes Advil as needed, usually two liquid gels before activities like ballet class or rehearsals, which helps significantly.  She started taking ballet classes earlier this year and noticed the pain during these classes, especially when going up on the balls of her feet. Walking barefoot is particularly painful, whereas wearing shoes provides relief. She is currently involved in a musical theater show and does some tap dancing, which she enjoys and finds less painful due to the arch support in her tap shoes.  She mentions a 'crunchy' sensation in her right foot when standing and rolling it back and forth, which feels 'weird going up my leg'.  She has a history of breaking her little toe in 2016, which was initially misdiagnosed as not broken. It was later confirmed to have healed incorrectly, but she does not believe this is related to her current foot pain.  She has been dealing with a toenail infection for five  years, which she suspects she contracted from a nail salon. She has been using antifungal ointment, which has kept the infection stable without spreading. Her father also had a similar condition.     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  See old records   Osteoporosis 2022   Personal history of radiation therapy    Postmenopausal HRT (hormone replacement therapy) 03/14/2018   Menopause at age 18;    Past Surgical History:  Procedure Laterality Date   BREAST LUMPECTOMY Left 2022   BREAST LUMPECTOMY WITH RADIOACTIVE SEED AND SENTINEL LYMPH NODE BIOPSY Left 12/29/2020   Procedure: LEFT BREAST LUMPECTOMY WITH RADIOACTIVE SEED;  Surgeon: Aron Shoulders, MD;  Location: [facility] OR;  Service: General;  Laterality: Left;   BREAST SURGERY  2022   Stage 1   SENTINEL NODE BIOPSY Left 12/29/2020   Procedure: LEFT AXILLARY SENTINEL NODE BIOPSY;  Surgeon: Aron Shoulders, MD;  Location: [facility] OR;  Service: General;  Laterality: Left;   WISDOM TOOTH EXTRACTION     No Known Allergies Physical Exam EXTREMITIES: Pulses intact.  No edema appreciated.  No erythema noted MUSCULOSKELETAL:  Mild tenderness on foot manipulation.  Across the second tarsometatarsal/Lisfranc joint with frontal plane range of motion bilateral.  No pain with ankle or subtalar joint range of motion.  No pain with first MPJ range of motion.  Epicritic sensation intact  Results RADIOLOGY Foot X-ray: Degenerative joint disease with bone-on-bone contact and subchondral sclerosis in the second tarsometatarsal joint complex. Right foot more affected than left.  Assessment/Plan of Care: 1. Arthritis of both feet   2. Capsulitis of left foot   3. 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 Pain exacerbated by activities involving standing on the ball of the foot, such as ballet and tap dancing. Conservative management with ibuprofen and activity modification is effective. Discussed potential for flare-ups and conservative management strategies. Risks of prolonged NSAID use include kidney strain and potential chronic kidney disease. Cortisone injections limited to three per year to avoid detrimental effects. - Continue ibuprofen as needed, with food, for pain management. - Consider switching to Tylenol  Arthritis if concerned about kidney strain. - Use Voltaren gel as needed, ensuring proper massage for absorption. - Consider cortisone injections for flare-ups, limited to three per year. - Use arch supports in regular shoes to reduce joint strain. - Explore arch support options for ballet shoes, such as arch binders or taping techniques. - Maintain mobility and activity levels, including dance, as tolerated.  Onychomycosis of toenail Chronic onychomycosis of the toenail, likely acquired from a nail salon, with no progression over five years. Current antifungal ointment is ineffective. Discussed that nail fungus is not a systemic concern and does not spread internally. - Consider switching to over-the-counter Funginail or prescription topical treatments like cyclopirox if desired. - Monitor for any changes or spread of the infection.    Awanda CHARM Imperial, DPM, FACFAS [facility]     [address] [place], [place] [address]                Office [phone]  Fax [phone]

## 2024-05-19 NOTE — Progress Notes (Signed)
 02/19/24- 39 yoF never smoker for sleep evaluation courtesy of Dr Lavern Heck with concern of possible sleep apnea. Medical problems include Allergic Rhinitis, Breast Ca L, Hyperlipidemia, Anxiety,  Epworth score-12 Body Weight today-154 lbs Discussed the use of AI scribe software for clinical note transcription with the patient, who gave verbal consent to proceed.  History of Present Illness   Katrina Williamson is a 68 year old female who presents with concerns of sleep apnea and excessive daytime sleepiness. She is accompanied by her husband.  She experiences loud snoring and episodes of breath-holding during sleep, particularly around 4 AM. Her husband has observed these symptoms. She occasionally wakes herself up after holding her breath.  She experiences excessive daytime sleepiness despite sleeping nine hours at night and taking a two-hour nap. She struggles to stay awake while reading or after lunch but does not fall asleep while driving.  She predominantly sleeps on her back due to bulging discs in her neck, which helps reduce headaches but increases snoring. A deviated septum has not been repaired due to potential complications.     Prior to Admission medications   Medication Sig Start Date End Date Taking? Authorizing Provider  alendronate  (FOSAMAX ) 70 MG tablet TAKE 1 TABLET (70 MG TOTAL) BY MOUTH EVERY 7 DAYS WITH FULL GLASS WATER ON EMPTY STOMACH 05/23/23  Yes Heck Lavern CROME, MD  fluticasone  (FLONASE ) 50 MCG/ACT nasal spray Place 2 sprays into both nostrils daily. 07/18/22  Yes Heck Lavern CROME, MD  sertraline  (ZOLOFT ) 50 MG tablet TAKE 1 TABLET BY MOUTH EVERYDAY AT BEDTIME 12/09/23  Yes Heck Lavern CROME, MD  tamoxifen  (NOLVADEX ) 20 MG tablet Take 1 tablet (20 mg total) by mouth daily. 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  See old records   Osteoporosis 2022   Personal history of radiation therapy    Postmenopausal HRT (hormone replacement therapy) 03/14/2018   Menopause at age 15;    Past Surgical History:  Procedure Laterality Date   BREAST LUMPECTOMY Left 2022   BREAST LUMPECTOMY WITH RADIOACTIVE SEED AND SENTINEL LYMPH NODE BIOPSY Left 12/29/2020   Procedure: LEFT BREAST LUMPECTOMY WITH RADIOACTIVE SEED;  Surgeon: Aron Shoulders, MD;  Location: [facility] OR;  Service: General;  Laterality: Left;   BREAST SURGERY  2022   Stage 1   SENTINEL NODE BIOPSY Left 12/29/2020   Procedure: LEFT AXILLARY SENTINEL NODE BIOPSY;  Surgeon: Aron Shoulders, MD;  Location: [facility] OR;  Service: General;  Laterality: Left;   WISDOM TOOTH EXTRACTION     Family History  Problem Relation Age of Onset   Arthritis Mother    Hearing loss Mother    Osteoporosis Mother        significant   Stroke Father    Alcohol abuse Sister    Arthritis Sister    Cancer Sister        uterine; 94s   Social History   Socioeconomic History   Marital status: Married    Spouse name: Not on file   Number of children: Not on file   Years of education: Not on  file   Highest education level: Bachelor's degree (e.g., BA, AB, BS)  Occupational History   Not on file  Tobacco Use   Smoking status: Never   Smokeless tobacco: Never  Vaping Use   Vaping status: Never Used  Substance and Sexual Activity   Alcohol use: Yes    Alcohol/week: 2.0 standard drinks of alcohol    Types: 1 Glasses of wine, 1 Shots of liquor per week   Drug use: Not Currently    Types: Marijuana   Sexual activity: Yes    Birth control/protection: Post-menopausal  Other Topics Concern   Not on file  Social History Narrative   Not on file   Social Drivers of Health   Financial Resource Strain: Low Risk   (01/31/2024)   Overall Financial Resource Strain (CARDIA)    Difficulty of Paying Living Expenses: Not hard at all  Food Insecurity: No Food Insecurity (01/31/2024)   Hunger Vital Sign    Worried About Running Out of Food in the Last Year: Never true    Ran Out of Food in the Last Year: Never true  Transportation Needs: No Transportation Needs (01/31/2024)   PRAPARE - Administrator, Civil Service (Medical): No    Lack of Transportation (Non-Medical): No  Physical Activity: Insufficiently Active (01/31/2024)   Exercise Vital Sign    Days of Exercise per Week: 2 days    Minutes of Exercise per Session: 40 min  Stress: No Stress Concern Present (11/19/2023)   [employer] of Occupational Health - Occupational Stress Questionnaire    Feeling of Stress: Not at all  Social Connections: Moderately Isolated (01/31/2024)   Social Connection and Isolation Panel    Frequency of Communication with Friends and Family: Once a week    Frequency of Social Gatherings with Friends and Family: Once a week    Attends Religious Services: Never    Database Administrator or Organizations: Yes    Attends Banker Meetings: 1 to 4 times per year    Marital Status: Married  Catering Manager Violence: Not At Risk (11/19/2023)   Humiliation, Afraid, Rape, and Kick questionnaire    Fear of Current or Ex-Partner: No    Emotionally Abused: No    Physically Abused: No    Sexually Abused: No   Assessment and Plan:    Suspected obstructive sleep apnea with associated daytime sleepiness and snoring Suspected obstructive sleep apnea due to snoring, daytime sleepiness, and apneic episodes. Deviated septum noted. Discussed risks of untreated sleep apnea and treatment options including CPAP, mouthpiece, and surgery. Emphasized shared decision-making. - Order home sleep test. - Await insurance clearance for home sleep test, expected within a week. - Instruct her to call the office two weeks  after the study for results. - Discuss treatment options based on test results, including CPAP, mouthpiece, and surgery if necessary.    05/20/24-  68 yoF never smoker followed for OSA Medical problems include Allergic Rhinitis, Breast Ca L, Hyperlipidemia, Anxiety,  Body Weight today-151 lbs HST 03/11/24- AHI 7.8/hr, desat to 88%, body weight 150 lbs For treatment decision -----Somnolence is about the same.  Takes a 2 hour nap during the day  We reviewed management options for mild OSA.  She would like to explore oral appliance and is being referred to Dr Micky. We can see her back here as needed.    ROS-see HPI   + = positive Constitutional:    weight loss, night sweats, fevers, chills, fatigue,  lassitude. HEENT:    +headaches, difficulty swallowing, tooth/dental problems, sore throat,       +sneezing, itching, +ear ache, +nasal congestion, post nasal drip, snoring CV:    chest pain, orthopnea, PND, swelling in lower extremities, anasarca,                                   dizziness, palpitations Resp:   +shortness of breath with exertion or at rest.                productive cough,   non-productive cough, coughing up of blood.              change in color of mucus.  wheezing.   Skin:    rash or lesions. GI:  No-   heartburn, indigestion, abdominal pain, nausea, vomiting, diarrhea,                 change in bowel habits, loss of appetite GU: dysuria, change in color of urine, no urgency or frequency.   flank pain. MS:   joint pain, +stiffness, decreased range of motion, back pain. Neuro-     nothing unusual Psych:  change in mood or affect.  depression or anxiety.   memory loss.  OBJ- Physical Exam General- Alert, Oriented, Affect-appropriate, Distress- none acute Skin- rash-none, lesions- none, excoriation- none Lymphadenopathy- none Head- atraumatic            Eyes- Gross vision intact, PERRLA, conjunctivae and secretions clear            Ears- Hearing, canals-normal             Nose- Clear, no-Septal dev, mucus, polyps, erosion, perforation             Throat- Mallampati III , mucosa clear , drainage- none, tonsils- atrophic, +teeth Neck- flexible , trachea midline, no stridor , thyroid  nl, carotid no bruit Chest - symmetrical excursion , unlabored           Heart/CV- RRR , no murmur , no gallop  , no rub, nl s1 s2                           - JVD- none , edema- none, stasis changes- none, varices- none           Lung- clear to P&A, wheeze- none, cough- none , dullness-none, rub- none           Chest wall-  Abd-  Br/ Gen/ Rectal- Not done, not indicated Extrem- cyanosis- none, clubbing, none, atrophy- none, strength- nl Neuro- grossly intact to observation

## 2024-05-20 ENCOUNTER — Encounter: Payer: Self-pay | Admitting: Internal Medicine

## 2024-05-20 ENCOUNTER — Ambulatory Visit: Admitting: Internal Medicine

## 2024-05-20 VITALS — BP 116/66 | HR 66 | Temp 97.3°F | Ht 64.0 in | Wt 151.6 lb

## 2024-05-20 DIAGNOSIS — R0683 Snoring: Secondary | ICD-10-CM | POA: Diagnosis not present

## 2024-05-20 DIAGNOSIS — G4733 Obstructive sleep apnea (adult) (pediatric): Secondary | ICD-10-CM

## 2024-05-20 DIAGNOSIS — R400 Somnolence: Secondary | ICD-10-CM

## 2024-05-20 NOTE — Patient Instructions (Signed)
 Order- referral to Dr Althea Grimmer, Orthodontist    consider oral appliance for OSA  Please call if we can help

## 2024-06-12 ENCOUNTER — Other Ambulatory Visit: Payer: Self-pay | Admitting: Oncology

## 2024-06-12 ENCOUNTER — Encounter: Payer: Self-pay | Admitting: Internal Medicine

## 2024-06-12 ENCOUNTER — Telehealth: Payer: Self-pay | Admitting: Oncology

## 2024-06-12 ENCOUNTER — Encounter: Payer: Self-pay | Admitting: Oncology

## 2024-06-12 ENCOUNTER — Inpatient Hospital Stay: Attending: Oncology | Admitting: Oncology

## 2024-06-12 VITALS — BP 108/62 | HR 66 | Temp 98.3°F | Resp 16 | Ht 64.0 in | Wt 150.7 lb

## 2024-06-12 DIAGNOSIS — Z17411 Hormone receptor positive with human epidermal growth factor receptor 2 negative status: Secondary | ICD-10-CM | POA: Diagnosis not present

## 2024-06-12 DIAGNOSIS — Z1732 Human epidermal growth factor receptor 2 negative status: Secondary | ICD-10-CM | POA: Insufficient documentation

## 2024-06-12 DIAGNOSIS — M810 Age-related osteoporosis without current pathological fracture: Secondary | ICD-10-CM | POA: Diagnosis not present

## 2024-06-12 DIAGNOSIS — Z170 Estrogen receptor positive status [ER+]: Secondary | ICD-10-CM | POA: Insufficient documentation

## 2024-06-12 DIAGNOSIS — Z79810 Long term (current) use of selective estrogen receptor modulators (SERMs): Secondary | ICD-10-CM | POA: Diagnosis not present

## 2024-06-12 DIAGNOSIS — C50512 Malignant neoplasm of lower-outer quadrant of left female breast: Secondary | ICD-10-CM

## 2024-06-12 DIAGNOSIS — Z923 Personal history of irradiation: Secondary | ICD-10-CM | POA: Diagnosis not present

## 2024-06-12 DIAGNOSIS — Z1721 Progesterone receptor positive status: Secondary | ICD-10-CM | POA: Diagnosis not present

## 2024-06-12 DIAGNOSIS — Z1231 Encounter for screening mammogram for malignant neoplasm of breast: Secondary | ICD-10-CM

## 2024-06-12 MED ORDER — TAMOXIFEN CITRATE 20 MG PO TABS: 20.0000 mg | ORAL_TABLET | Freq: Every day | ORAL | 3 refills | Status: AC

## 2024-06-12 NOTE — Progress Notes (Signed)
 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 This was treated with lumpectomy and adjuvant radiation. She was placed on hormonal therapy with Tamoxifen  in November 2022, and will continue this for a total of 5 years.  Osteoporosis, for which she started alendronate . DEXA scan done on 02/17/025 which revealed osteoporosis of the left and right hip with a T-score of -2.4 and -2.5 and normal spine with a T-score of -0.5.   Plan:  She feels well and has no complaints of pain. She reports a growing nevus on her right knee. This appears to be a benign keratosis and she will be seeing her dermatologist in the next 2 months for her routine follow up. 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 This is currently well-managed with greek yogurt and probiotics. If she experiences worsening, I will prescribe estrogen cream for her to use. She has discontinued Fosamax  due to concerns about potential side effects such as osteonecrosis of the jaw. Her next bone density scan is due in February of 2027. If it shows significant worsening, we will consider alternative options, but hopefully the Tamoxifen  will have a positive effect on her bones. She had a unilateral left diagnostic mammogram on 12/06/2023 which revealed no evidence of new or recurrent breast malignancy and stable benign post lumpectomy changes on the left. She completes routine labs at with PCP. I will see her back in 6 months, 1 week after her bilateral screening mammogram.  She continues Tamoxifen  20 mg daily without difficulty which I will refill today. She had a screening bilateral mammogram done on 11/26/2023 which revealed a possible  asymmetry in the left breast. A diagnostic mammogram and ultrasound were scheduled on July, 3rd at the [facility] in [place] for further evaluation. During physical exam I felt no definite masses and advised her that if her diagnostic mammogram does find anything to go forward with the biopsy and I will see her after her pathology results. Patient had a DEXA scan done on 02/17/025 which revealed osteoporosis of the left and right hip with a T-score of -2.4 and -2.5 and normal spine with a T-score of -0.5. She continues alendronate  without difficulty other than some missed doses. I reassured her on the potential outcome and will see her back in 6 months for reevaluation.   She understands and agrees with this plan of care. I have answered her questions and she knows to call with any concerns.  I provided 19 minutes of face-to-face time during this this encounter and > 50% was spent counseling as documented under my assessment and plan.   Wanda VEAR Cornish, MD  [facility] A DEPT OF [facility] [address] [place] [address] Dept: [phone] Dept Fax: [phone]   No orders of the defined types were placed in this encounter.  CHIEF COMPLAINT:  CC: Stage IA hormone receptor positive left breast cancer  Current Treatment:  Tamoxifen   HISTORY OF PRESENT ILLNESS:  Katrina Williamson is a 69 y.o. female who presents as a transfer of care from Dr. Tauna office for the continued management and treatment of stage IA (T1b N0 M0) hormone receptor positive  invasive lobular carcinoma of the left breast, diagnosed in June 2022. This was found on screening mammogram from May 2022 which showed possible abnormality in the left breast. Diagnostic imaging from June confirmed a 0.9 mm mass in the left breast at 3:30 o'clock. No adenopathy was identified. Biopsy was pursued on June 14th and pathology revealed invasive mammary carcinoma,  grade 2, and mammary carcinoma in situ. Estrogen receptor was positive at 95% and progesterone  receptor was positive at 90%. HER2 was negative 1+. Ki67 was 1%. Bilateral breast MRI from July revealed a biopsy proven malignancy measuring 1.1 cm within the left breast. Right breast was negative. She underwent lumpectomy with Dr. Aron on July 27th and final pathology from this procedure confirmed a grade 2 invasive lobular carcinoma, 1.4 cm, lobular neoplasia. All margins were negative. Two sentinel lymph nodes were negative for carcinoma (0/2). She did have some mild complications after her procedure with opening of the incisions, which required packing. Oncotype revealed a recurrence score of 22, low risk, and predicts a risk of recurrence outside the breast over the next 9 years of 8%, if the patient's only systemic therapy is an antiestrogen for 5 years.  It also predicted  no benefit from chemotherapy. She received adjuvant radiation to the left breast, and was started on adjuvant endocrine therapy with tamoxifen  in November 2022, with plans of 5 years of therapy.    I have reviewed her chart and materials related to her cancer extensively and collaborated history with the patient. Summary of oncologic history is as follows: Oncology History  Malignant neoplasm of lower-outer quadrant of left breast of female, estrogen receptor positive (HCC)  11/29/2020 Initial Diagnosis   Malignant neoplasm of lower-outer quadrant of left breast of female, estrogen receptor positive (HCC)   11/30/2020 Cancer Staging   Staging form: Breast, AJCC 8th Edition - Clinical: Stage IA (cT1b, cN0, cM0, G2, ER+, PR+, HER2-) - Signed by Layla Sandria BROCKS, MD on 11/30/2020 Histologic grading system: 3 grade system   12/29/2020 Surgery   Left breast lumpectomy with sentinel lymph node. Two lymph nodes biopsied, both negative for carcinoma.    Radiation Therapy   03/08/2021 through 04/04/2021 Site Technique Total Dose (Gy)  Dose per Fx (Gy) Completed Fx Beam Energies  Breast, Left: Breast_Lt 3D 42.56/42.56 2.66 16/16 6X  Breast, Left: Breast_Lt_Bst 3D 8/8 2 4/4 6X     Surgery       INTERVAL HISTORY:  Dagoberto is here today for repeat clinical assessment of stage stage IA (T1b N0 M0) hormone receptor positive left breast cancer. She was diagnosed in June 2022. Patient states that she feels well and has no complaints of pain. She reports a growing nevus on her right knee. This appears to be a benign keratosis and she will be seeing her dermatologist in the next 2 months for her routine follow up. 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 This is currently well-managed with greek yogurt and probiotics. If she experiences worsening, I will prescribe estrogen cream for her to use. She has discontinued Fosamax  due to concerns about potential side effects such as osteonecrosis of the jaw. Her next bone density scan is due in February of 2027. If it shows significant worsening, we will consider alternative options, but hopefully the Tamoxifen  will have a positive effect on her bones. She had a unilateral left diagnostic mammogram on 12/06/2023 which revealed no evidence of new or  recurrent breast malignancy and stable benign post lumpectomy changes on the left. She completes routine labs at with PCP. I will see her back in 6 months, 1 week after her bilateral screening mammogram.  She denies fever, chills, night sweats, or other signs of infection. She denies cardiorespiratory and gastrointestinal issues. She  denies pain. Her appetite is good and Her weight has decreased 1 pound over 3 weeks.  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  See old records   Osteoporosis 2022   Personal history of radiation therapy    Postmenopausal HRT (hormone replacement therapy) 03/14/2018   Menopause at age 53;    Past Surgical History:  Procedure Laterality Date   BREAST LUMPECTOMY Left 2022   BREAST LUMPECTOMY WITH RADIOACTIVE SEED AND SENTINEL LYMPH NODE BIOPSY Left 12/29/2020   Procedure: LEFT BREAST LUMPECTOMY WITH RADIOACTIVE SEED;  Surgeon: Aron Shoulders, MD;  Location: [facility] OR;  Service: General;  Laterality: Left;   BREAST SURGERY  2022   Stage 1   SENTINEL NODE BIOPSY Left 12/29/2020   Procedure: LEFT AXILLARY SENTINEL NODE BIOPSY;  Surgeon: Aron Shoulders, MD;  Location: [facility] OR;  Service: General;  Laterality: Left;   WISDOM TOOTH EXTRACTION     Family History  Problem Relation Age of Onset   Arthritis Mother    Hearing loss Mother    Osteoporosis Mother        significant   Stroke Father    Alcohol abuse Sister    Arthritis Sister    Cancer Sister        uterine; 46s   Social History:  reports that she has never smoked. She has never used smokeless tobacco. She reports current alcohol use of about 2.0 standard drinks of alcohol per week. She reports that she does not currently use drugs after having used the following drugs: Marijuana. She is divorced and lives at home. She is nulliparous. 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  No Known Allergies  Current Medications: Current Outpatient Medications  Medication Sig Dispense Refill   alendronate  (FOSAMAX ) 70 MG tablet Take 1 tablet (70 mg total) by mouth every 7 (seven) days. Take with a full glass of water on an empty stomach. 12 tablet 3   fluticasone  (FLONASE ) 50 MCG/ACT nasal spray Place 2 sprays into both nostrils daily.     sertraline  (ZOLOFT ) 50 MG tablet Take 1 tablet (50 mg total) by mouth at bedtime. 90 tablet 3    tamoxifen  (NOLVADEX ) 20 MG tablet Take 1 tablet (20 mg total) by mouth daily. 90 tablet 3   No current facility-administered medications for this visit.    REVIEW OF SYSTEMS:  Review of Systems  Constitutional: Negative.  Negative for appetite change, chills, diaphoresis, fatigue, fever and unexpected weight change.  HENT:  Negative.  Negative for hearing loss, lump/mass, mouth sores, nosebleeds, sore throat, tinnitus, trouble swallowing and voice change.   Eyes: Negative.  Negative for eye problems and icterus.  Respiratory: Negative.  Negative for chest tightness, cough, hemoptysis, shortness of breath and wheezing.   Cardiovascular:  Negative.  Negative for chest pain, leg swelling and palpitations.  Gastrointestinal: Negative.  Negative for abdominal distention, abdominal pain, blood in stool, constipation, diarrhea, nausea, rectal pain and vomiting.  Endocrine: Negative.   Genitourinary:  Negative for bladder incontinence, difficulty urinating, dyspareunia, dysuria, frequency, hematuria, menstrual problem, nocturia, pelvic pain, vaginal bleeding and vaginal discharge.   Musculoskeletal: Negative.  Negative for arthralgias, back pain, flank pain, gait problem, myalgias, neck pain and neck stiffness.  Skin: Negative.  Negative for itching, rash and wound.       Mole on her right knee  Neurological:  Negative for dizziness, extremity weakness, gait problem, headaches, light-headedness, numbness, seizures and speech difficulty.  Hematological: Negative.  Negative for adenopathy. Does not bruise/bleed easily.  Psychiatric/Behavioral: Negative.  Negative for confusion, decreased concentration, depression, sleep disturbance and suicidal ideas. The patient is not nervous/anxious.    VITALS:   Vitals:   06/12/24 1517 06/12/24 1521  BP: (!) 91/59 108/62  Pulse: 66   Resp: 16   Temp: 98.3 F (36.8 C)   SpO2: 99%     Wt Readings from Last 3 Encounters:  06/12/24 150 lb 11.2 oz (68.4 kg)   05/20/24 151 lb 9.6 oz (68.8 kg)  02/19/24 154 lb (69.9 kg)   Performance status (ECOG): 0 - Asymptomatic  PHYSICAL EXAM:  Physical Exam Vitals and nursing note reviewed.  Constitutional:      General: She is not in acute distress.    Appearance: Normal appearance. She is normal weight. She is not ill-appearing, toxic-appearing or diaphoretic.  HENT:     Head: Normocephalic and atraumatic.     Right Ear: Tympanic membrane, ear canal and external ear normal. There is no impacted cerumen.     Left Ear: Tympanic membrane, ear canal and external ear normal. There is no impacted cerumen.     Nose: Nose normal. No congestion or rhinorrhea.     Mouth/Throat:     Mouth: Mucous membranes are moist.     Pharynx: Oropharynx is clear. No oropharyngeal exudate or posterior oropharyngeal erythema.  Eyes:     General: No scleral icterus.       Right eye: No discharge.        Left eye: No discharge.     Extraocular Movements: Extraocular movements intact.     Conjunctiva/sclera: Conjunctivae normal.     Pupils: Pupils are equal, round, and reactive to light.  Neck:     Vascular: No carotid bruit.  Cardiovascular:     Rate and Rhythm: Normal rate and regular rhythm.     Pulses: Normal pulses.     Heart sounds: Normal heart sounds. No murmur heard.    No friction rub. No gallop.  Pulmonary:     Effort: Pulmonary effort is normal. No respiratory distress.     Breath sounds: Normal breath sounds. No stridor. No wheezing, rhonchi or rales.  Chest:     Chest wall: No tenderness.     Comments: Barely visible scar in the lateral left breast which is well-healed. Left axillary scar which is well-healed No masses in either breast. Abdominal:     General: Bowel sounds are normal. There is no distension.     Palpations: Abdomen is soft. There is no hepatomegaly, splenomegaly or mass.     Tenderness: There is no abdominal tenderness. There is no right CVA tenderness, left CVA tenderness, guarding or  rebound.     Hernia: No hernia is present.  Musculoskeletal:  General: No swelling, tenderness, deformity or signs of injury. Normal range of motion.     Cervical back: Normal range of motion and neck supple. No rigidity or tenderness.     Right lower leg: No edema.     Left lower leg: No edema.  Lymphadenopathy:     Cervical: No cervical adenopathy.     Right cervical: No superficial, deep or posterior cervical adenopathy.    Left cervical: No superficial, deep or posterior cervical adenopathy.     Upper Body:     Right upper body: No supraclavicular, axillary or pectoral adenopathy.     Left upper body: No supraclavicular, axillary or pectoral adenopathy.  Skin:    General: Skin is warm and dry.     Coloration: Skin is not jaundiced or pale.     Findings: No bruising, erythema, lesion or rash.     Comments: Lesion of medial right knee which is raised and keratotic, this appears to be benign, but the patient describes that it is enlarging.  Neurological:     General: No focal deficit present.     Mental Status: She is alert and oriented to person, place, and time. Mental status is at baseline.     Cranial Nerves: No cranial nerve deficit.     Sensory: No sensory deficit.     Motor: No weakness.     Coordination: Coordination normal.     Gait: Gait normal.     Deep Tendon Reflexes: Reflexes normal.  Psychiatric:        Mood and Affect: Mood normal.        Behavior: Behavior normal.        Thought Content: Thought content normal.        Judgment: Judgment normal.    LABS:      Latest Ref Rng & Units 10/22/2023   10:03 AM 01/21/2023   11:36 AM 10/05/2022   10:24 AM  CBC  WBC 4.0 - 10.5 K/uL 5.6  4.9  11.7   Hemoglobin 12.0 - 15.0 g/dL 86.1  87.8  86.6   Hematocrit 36.0 - 46.0 % 41.6  37.5  40.3   Platelets 150.0 - 400.0 K/uL 234.0  158  234.0    Lab Results  Component Value Date   CREATININE 0.96 10/22/2023   BUN 15 10/22/2023   NA 140 10/22/2023   K 4.0  10/22/2023   CL 106 10/22/2023   CO2 27 10/22/2023      Component Value Date/Time   PROT 6.8 10/22/2023 1003   ALBUMIN 4.4 10/22/2023 1003   AST 19 10/22/2023 1003   AST 18 04/25/2022 1049   ALT 18 10/22/2023 1003   ALT 15 04/25/2022 1049   ALKPHOS 62 10/22/2023 1003   BILITOT 0.6 10/22/2023 1003   BILITOT 0.8 04/25/2022 1049    Lab Results  Component Value Date   TSH 2.61 10/22/2023   Lab Results  Component Value Date   VD25OH 30.08 10/22/2023   STUDIES:  EXAM: 12/06/2023 DIGITAL DIAGNOSTIC UNILATERAL LEFT MAMMOGRAM WITH TOMOSYNTHESIS AND CAD IMPRESSION: 1. No evidence of new or recurrent breast malignancy. 2. Stable benign post lumpectomy changes on the left.  EXAM: 11/26/2023 DIGITAL SCREENING BILATERAL MAMMOGRAM WITH TOMOSYNTHESIS AND CAD IMPRESSION: Further evaluation is suggested for possible asymmetry in the left breast. RECOMMENDATION: Diagnostic mammogram and possibly ultrasound of the left breast.  EXAM: 07/13/2023 DUAL X-RAY ABSORPTIOMETRY (DXA) FOR BONE MINERAL DENSITY  LUMBAR SPINE (L1, L4): BMD (in g/cm2): 1.101 T-score: -0.5 Z-score: 1.1  LEFT FEMORAL NECK BMD (in g/cm2): 0.699 T-score: -2.4 Z-score: -0.9   LEFT TOTAL HIP: BMD (in g/cm2): 0.737 T-score: -2.1 Z-score: -0.8   RIGHT FEMORAL NECK: BMD (in g/cm2): 0.686 T-score: -2.5 Z-score: -1.0   RIGHT TOTAL HIP: BMD (in g/cm2): 0.727 T-score: -2.2 Z-score: -0.9 IMPRESSION: Osteoporosis based on BMD.  I, Aretta Cook, acting as a scribe for Wanda VEAR Cornish, MD, have documented all relevant documentation on the behalf of Wanda VEAR Cornish, MD, as directed by Wanda VEAR Cornish, MD, while in the presence of Wanda VEAR Cornish, MD.  I have reviewed this report as typed by the medical scribe, and it is complete and accurate.  "

## 2024-06-12 NOTE — Telephone Encounter (Signed)
 Patient has been scheduled for follow-up visit per 06/12/2024 LOS.  Pt given an appt calendar with date and time.

## 2024-10-23 ENCOUNTER — Ambulatory Visit: Admitting: Family Medicine

## 2024-11-19 ENCOUNTER — Ambulatory Visit

## 2024-12-08 ENCOUNTER — Ambulatory Visit

## 2024-12-11 ENCOUNTER — Inpatient Hospital Stay: Admitting: Oncology
